# Patient Record
Sex: Male | Born: 1955 | Race: White | Hispanic: No | Marital: Married | Smoking: Former smoker
Health system: Southern US, Community
[De-identification: ages and names within clinical notes are randomized; demographics above are authoritative.]

## PROBLEM LIST (undated history)

## (undated) ENCOUNTER — Emergency Department (HOSPITAL_BASED_OUTPATIENT_CLINIC_OR_DEPARTMENT_OTHER): Admission: EM | Payer: Medicare HMO

## (undated) DIAGNOSIS — E785 Hyperlipidemia, unspecified: Secondary | ICD-10-CM

## (undated) DIAGNOSIS — I719 Aortic aneurysm of unspecified site, without rupture: Secondary | ICD-10-CM

## (undated) DIAGNOSIS — M1990 Unspecified osteoarthritis, unspecified site: Secondary | ICD-10-CM

## (undated) DIAGNOSIS — C9110 Chronic lymphocytic leukemia of B-cell type not having achieved remission: Secondary | ICD-10-CM

## (undated) DIAGNOSIS — F419 Anxiety disorder, unspecified: Secondary | ICD-10-CM

## (undated) DIAGNOSIS — I10 Essential (primary) hypertension: Secondary | ICD-10-CM

## (undated) DIAGNOSIS — F32A Depression, unspecified: Secondary | ICD-10-CM

## (undated) DIAGNOSIS — T7840XA Allergy, unspecified, initial encounter: Secondary | ICD-10-CM

## (undated) DIAGNOSIS — N400 Enlarged prostate without lower urinary tract symptoms: Secondary | ICD-10-CM

## (undated) DIAGNOSIS — G4730 Sleep apnea, unspecified: Secondary | ICD-10-CM

## (undated) DIAGNOSIS — I2510 Atherosclerotic heart disease of native coronary artery without angina pectoris: Secondary | ICD-10-CM

## (undated) DIAGNOSIS — F329 Major depressive disorder, single episode, unspecified: Secondary | ICD-10-CM

## (undated) HISTORY — DX: Benign prostatic hyperplasia without lower urinary tract symptoms: N40.0

## (undated) HISTORY — DX: Chronic lymphocytic leukemia of B-cell type not having achieved remission: C91.10

## (undated) HISTORY — DX: Atherosclerotic heart disease of native coronary artery without angina pectoris: I25.10

## (undated) HISTORY — DX: Essential (primary) hypertension: I10

## (undated) HISTORY — DX: Hyperlipidemia, unspecified: E78.5

## (undated) HISTORY — DX: Unspecified osteoarthritis, unspecified site: M19.90

## (undated) HISTORY — DX: Aortic aneurysm of unspecified site, without rupture: I71.9

## (undated) HISTORY — DX: Sleep apnea, unspecified: G47.30

## (undated) HISTORY — DX: Allergy, unspecified, initial encounter: T78.40XA

---
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
## 2008-10-07 ENCOUNTER — Emergency Department (HOSPITAL_COMMUNITY): Admission: EM | Admit: 2008-10-07 | Discharge: 2008-10-07 | Payer: Self-pay | Admitting: Family Medicine

## 2013-09-06 ENCOUNTER — Emergency Department (INDEPENDENT_AMBULATORY_CARE_PROVIDER_SITE_OTHER)
Admission: EM | Admit: 2013-09-06 | Discharge: 2013-09-06 | Disposition: A | Discharge status: Home / Self Care | Payer: BC Managed Care – PPO | Source: Home / Self Care

## 2013-09-06 ENCOUNTER — Emergency Department (INDEPENDENT_AMBULATORY_CARE_PROVIDER_SITE_OTHER): Payer: BC Managed Care – PPO

## 2013-09-06 ENCOUNTER — Emergency Department (HOSPITAL_COMMUNITY): Payer: BC Managed Care – PPO

## 2013-09-06 ENCOUNTER — Encounter (HOSPITAL_COMMUNITY): Payer: Self-pay | Admitting: Emergency Medicine

## 2013-09-06 DIAGNOSIS — S2249XA Multiple fractures of ribs, unspecified side, initial encounter for closed fracture: Secondary | ICD-10-CM

## 2013-09-06 DIAGNOSIS — S2242XA Multiple fractures of ribs, left side, initial encounter for closed fracture: Secondary | ICD-10-CM

## 2013-09-06 HISTORY — DX: Major depressive disorder, single episode, unspecified: F32.9

## 2013-09-06 HISTORY — DX: Depression, unspecified: F32.A

## 2013-09-06 HISTORY — DX: Anxiety disorder, unspecified: F41.9

## 2013-09-06 MED ORDER — KETOROLAC TROMETHAMINE 60 MG/2ML IM SOLN
INTRAMUSCULAR | Status: AC
  Filled 2013-09-06: qty 2

## 2013-09-06 MED ORDER — HYDROCODONE-ACETAMINOPHEN 5-325 MG PO TABS: 1.0000 | ORAL_TABLET | ORAL | Status: DC | PRN

## 2013-09-06 MED ORDER — OXYCODONE-ACETAMINOPHEN 5-325 MG PO TABS: 1.0000 | ORAL_TABLET | ORAL | Status: DC | PRN

## 2013-09-06 MED ORDER — KETOROLAC TROMETHAMINE 60 MG/2ML IM SOLN
60.0000 mg | Freq: Once | INTRAMUSCULAR | Status: AC
  Administered 2013-09-06: 60 mg via INTRAMUSCULAR

## 2013-09-06 NOTE — ED Notes (Signed)
Fall yesterday pm. Pt thinks it may be a combination of factors that caused him to fall and land on left posterior rib area. Ecchymosis and abrasions present . Denies other injury wife states she had to shake hip to wake him up from the ground

## 2013-09-06 NOTE — ED Provider Notes (Signed)
CSN: [id number]     Arrival date & time 09/06/13  1516 History   First MD Initiated Contact with Patient 09/06/13 1641     Chief Complaint  Patient presents with  . Fall   (Consider location/radiation/quality/duration/timing/severity/associated sxs/prior Treatment) HPI Comments: 58 year old male was on his back porch last night and stumbled over a small brick wall and fell to the left lateral chest. He is complaining of pain to the left lateral chest and the pain is exacerbated with coughing and deep breathing. He denies injury to the head, neck, back, spine, lower extremities. He is fully awake alert and oriented and denies problems with memory or orientation.   Past Medical History  Diagnosis Date  . Depressed   . Anxiety    History reviewed. No pertinent past surgical history. History reviewed. No pertinent family history. History  Substance Use Topics  . Smoking status: Never Smoker   . Smokeless tobacco: Not on file  . Alcohol Use: Yes    Review of Systems  Constitutional: Negative.   HENT: Negative.   Respiratory: Negative.  Negative for cough, chest tightness, shortness of breath, wheezing and stridor.   Cardiovascular: Negative.   Gastrointestinal: Negative.   Genitourinary: Negative.   Musculoskeletal: Negative for gait problem, myalgias, neck pain and neck stiffness.       As per HPI  Skin: Negative.   Neurological: Negative for dizziness, syncope, weakness, numbness and headaches.    Allergies  Penicillins  Home Medications   Current Outpatient Rx  Name  Route  Sig  Dispense  Refill  . LORazepam (ATIVAN) 1 MG tablet   Oral   Take 1 mg by mouth every 8 (eight) hours.         . sertraline (ZOLOFT) 50 MG tablet   Oral   Take 50 mg by mouth daily.         Marland Kitchen HYDROcodone-acetaminophen (NORCO/VICODIN) 5-325 MG per tablet   Oral   Take 1 tablet by mouth every 4 (four) hours as needed.   20 tablet   0    BP 150/78  Pulse 62  Temp(Src) 99.7 F (37.6  C) (Oral)  Resp 17  SpO2 97% Physical Exam  Nursing note and vitals reviewed. Constitutional: He is oriented to person, place, and time. He appears well-developed and well-nourished. No distress.  HENT:  Mouth/Throat: Oropharynx is clear and moist.  Eyes: Conjunctivae and EOM are normal. Pupils are equal, round, and reactive to light.  Neck: Normal range of motion. Neck supple.  Cardiovascular: Normal rate, regular rhythm and normal heart sounds.   Pulmonary/Chest: Effort normal and breath sounds normal. No respiratory distress. He has no wheezes. He has no rales. He exhibits tenderness.  Tenderness in ecchymosis to the left lateral chest wall from the midaxillary line to just across the posterior axillary line. The ecchymosis occurs in a linear fashion approximately midway between the axilla and 10th rib.  Lungs without pleural friction rub, crackles or wheezes.  Musculoskeletal: He exhibits no edema.  Neurological: He is alert and oriented to person, place, and time. He exhibits normal muscle tone.  Skin: Skin is warm and dry. No erythema.  Psychiatric: He has a normal mood and affect.    ED Course  Procedures (including critical care time) Labs Review Labs Reviewed - No data to display Imaging Review Dg Ribs Unilateral W/chest Left  09/06/2013   CLINICAL DATA:  Status post fall.  Left chest pain.  EXAM: LEFT RIBS AND CHEST - 3+ VIEW  COMPARISON:  None.  FINDINGS: Lungs are clear. No pneumothorax or pleural effusion. Heart size is normal. There are acute fractures of the left fifth, sixth and seventh ribs. No other acute abnormality is identified.  IMPRESSION: Acute left fifth through seventh rib fractures. Negative for pneumothorax.   Electronically Signed   By: Inge Rise M.D.   On: 09/06/2013 17:15     MDM   1. Multiple fractures of ribs of left side    Ibuprofen 800 q 8 h prn with food Percocet 5 q 4h prn pain #20 See your doctor nextr week Reviewed red flag sx's for  pneumo bleeding, etc. Take deep breaths often  Janne Napoleon, NP 09/06/13 1803

## 2013-09-06 NOTE — Discharge Instructions (Signed)
Rib Fracture  A rib fracture is a break or crack in one of the bones of the ribs. The ribs are a group of long, curved bones that wrap around your chest and attach to your spine. They protect your lungs and other organs in the chest cavity. A broken or cracked rib is often painful, but most do not cause other problems. Most rib fractures heal on their own over time. However, rib fractures can be more serious if multiple ribs are broken or if broken ribs move out of place and push against other structures.  CAUSES   · A direct blow to the chest. For example, this could happen during contact sports, a car accident, or a fall against a hard object.  · Repetitive movements with high force, such as pitching a baseball or having severe coughing spells.  SYMPTOMS   · Pain when you breathe in or cough.  · Pain when someone presses on the injured area.  DIAGNOSIS   Your caregiver will perform a physical exam. Various imaging tests may be ordered to confirm the diagnosis and to look for related injuries. These tests may include a chest X-ray, computed tomography (CT), magnetic resonance imaging (MRI), or a bone scan.  TREATMENT   Rib fractures usually heal on their own in 1 3 months. The longer healing period is often associated with a continued cough or other aggravating activities. During the healing period, pain control is very important. Medication is usually given to control pain. Hospitalization or surgery may be needed for more severe injuries, such as those in which multiple ribs are broken or the ribs have moved out of place.   HOME CARE INSTRUCTIONS   · Avoid strenuous activity and any activities or movements that cause pain. Be careful during activities and avoid bumping the injured rib.  · Gradually increase activity as directed by your caregiver.  · Only take over-the-counter or prescription medications as directed by your caregiver. Do not take other medications without asking your caregiver first.  · Apply ice  to the injured area for the first 1 2 days after you have been treated or as directed by your caregiver. Applying ice helps to reduce inflammation and pain.  · Put ice in a plastic bag.  · Place a towel between your skin and the bag.    · Leave the ice on for 15 20 minutes at a time, every 2 hours while you are awake.  · Perform deep breathing as directed by your caregiver. This will help prevent pneumonia, which is a common complication of a broken rib. Your caregiver may instruct you to:  · Take deep breaths several times a day.  · Try to cough several times a day, holding a pillow against the injured area.  · Use a device called an incentive spirometer to practice deep breathing several times a day.  · Drink enough fluids to keep your urine clear or pale yellow. This will help you avoid constipation.    · Do not wear a rib belt or binder. These restrict breathing, which can lead to pneumonia.    SEEK IMMEDIATE MEDICAL CARE IF:   · You have a fever.    · You have difficulty breathing or shortness of breath.    · You develop a continual cough, or you cough up thick or bloody sputum.  · You feel sick to your stomach (nausea), throw up (vomit), or have abdominal pain.    · You have worsening pain not controlled with medications.      MAKE SURE YOU:  · Understand these instructions.  · Will watch your condition.  · Will get help right away if you are not doing well or get worse.  Document Released: 06/22/2005 Document Revised: 02/22/2013 Document Reviewed: 08/24/2012  [employer]® Patient Information ©2014 [employer].

## 2013-09-07 NOTE — ED Provider Notes (Signed)
Medical screening examination/treatment/procedure(s) were performed by a resident physician or non-physician practitioner and as the supervising physician I was immediately available for consultation/collaboration.  Lynne Leader, MD    Gregor Hams, MD 09/07/13 [phone]

## 2013-09-12 ENCOUNTER — Emergency Department (HOSPITAL_BASED_OUTPATIENT_CLINIC_OR_DEPARTMENT_OTHER)
Admission: EM | Admit: 2013-09-12 | Discharge: 2013-09-12 | Disposition: A | Discharge status: Home / Self Care | Payer: BC Managed Care – PPO | Attending: Emergency Medicine | Admitting: Emergency Medicine

## 2013-09-12 ENCOUNTER — Emergency Department (HOSPITAL_BASED_OUTPATIENT_CLINIC_OR_DEPARTMENT_OTHER): Payer: BC Managed Care – PPO

## 2013-09-12 ENCOUNTER — Encounter (HOSPITAL_BASED_OUTPATIENT_CLINIC_OR_DEPARTMENT_OTHER): Payer: Self-pay | Admitting: Emergency Medicine

## 2013-09-12 DIAGNOSIS — F3289 Other specified depressive episodes: Secondary | ICD-10-CM | POA: Insufficient documentation

## 2013-09-12 DIAGNOSIS — IMO0002 Reserved for concepts with insufficient information to code with codable children: Secondary | ICD-10-CM | POA: Insufficient documentation

## 2013-09-12 DIAGNOSIS — Z79899 Other long term (current) drug therapy: Secondary | ICD-10-CM | POA: Insufficient documentation

## 2013-09-12 DIAGNOSIS — W1809XA Striking against other object with subsequent fall, initial encounter: Secondary | ICD-10-CM | POA: Insufficient documentation

## 2013-09-12 DIAGNOSIS — F329 Major depressive disorder, single episode, unspecified: Secondary | ICD-10-CM | POA: Insufficient documentation

## 2013-09-12 DIAGNOSIS — S301XXA Contusion of abdominal wall, initial encounter: Secondary | ICD-10-CM | POA: Insufficient documentation

## 2013-09-12 DIAGNOSIS — Y939 Activity, unspecified: Secondary | ICD-10-CM | POA: Insufficient documentation

## 2013-09-12 DIAGNOSIS — Y929 Unspecified place or not applicable: Secondary | ICD-10-CM | POA: Insufficient documentation

## 2013-09-12 DIAGNOSIS — R079 Chest pain, unspecified: Secondary | ICD-10-CM | POA: Insufficient documentation

## 2013-09-12 DIAGNOSIS — G8911 Acute pain due to trauma: Secondary | ICD-10-CM | POA: Insufficient documentation

## 2013-09-12 DIAGNOSIS — F411 Generalized anxiety disorder: Secondary | ICD-10-CM | POA: Insufficient documentation

## 2013-09-12 DIAGNOSIS — Z880 Allergy status to penicillin: Secondary | ICD-10-CM | POA: Insufficient documentation

## 2013-09-12 LAB — BASIC METABOLIC PANEL
BUN: 17 mg/dL (ref 6–23)
CALCIUM: 9.7 mg/dL (ref 8.4–10.5)
CO2: 26 meq/L (ref 19–32)
Chloride: 100 mEq/L (ref 96–112)
Creatinine, Ser: 1.2 mg/dL (ref 0.50–1.35)
GFR calc non Af Amer: 65 mL/min — ABNORMAL LOW (ref >90)
GFR, EST AFRICAN AMERICAN: 75 mL/min — AB (ref >90)
Glucose, Bld: 97 mg/dL (ref 70–99)
Potassium: 4.6 mEq/L (ref 3.7–5.3)
SODIUM: 139 meq/L (ref 137–147)

## 2013-09-12 MED ORDER — IOHEXOL 300 MG/ML  SOLN
100.0000 mL | Freq: Once | INTRAMUSCULAR | Status: AC | PRN
  Administered 2013-09-12: 100 mL via INTRAVENOUS

## 2013-09-12 NOTE — Discharge Instructions (Signed)

## 2013-09-12 NOTE — ED Provider Notes (Signed)
CSN: [id number]     Arrival date & time 09/12/13  1500 History   First MD Initiated Contact with Patient 09/12/13 1512     Chief Complaint  Patient presents with  . Fall     (Consider location/radiation/quality/duration/timing/severity/associated sxs/prior Treatment) HPI Comments: [place] one week ago - diagnosed with L posterior rib fractures. L flank bruising developed and worsening over past 3 days. Denies abdominal pain.   Patient is a 58 y.o. male presenting with fall. The history is provided by the patient.  Fall This is a new problem. The current episode started more than 1 week ago. Episode frequency: once. The problem has been resolved. Pertinent negatives include no chest pain, no abdominal pain and no shortness of breath. Nothing aggravates the symptoms. Nothing relieves the symptoms.    Past Medical History  Diagnosis Date  . Depressed   . Anxiety    History reviewed. No pertinent past surgical history. No family history on file. History  Substance Use Topics  . Smoking status: Never Smoker   . Smokeless tobacco: Not on file  . Alcohol Use: Yes    Review of Systems  Constitutional: Negative for fever.  Respiratory: Negative for cough and shortness of breath.   Cardiovascular: Negative for chest pain.  Gastrointestinal: Negative for nausea, vomiting, abdominal pain and diarrhea.  All other systems reviewed and are negative.      Allergies  Penicillins  Home Medications   Current Outpatient Rx  Name  Route  Sig  Dispense  Refill  . LORazepam (ATIVAN) 1 MG tablet   Oral   Take 1 mg by mouth every 8 (eight) hours.         Marland Kitchen oxyCODONE-acetaminophen (ROXICET) 5-325 MG per tablet   Oral   Take 1 tablet by mouth every 4 (four) hours as needed for severe pain.   20 tablet   0   . sertraline (ZOLOFT) 50 MG tablet   Oral   Take 50 mg by mouth daily.          BP 161/78  Pulse 61  Temp(Src) 98.1 F (36.7 C) (Oral)  Resp 18  Ht 5\' 11"  (1.803 m)  Wt  150 lb (68.04 kg)  BMI 20.93 kg/m2  SpO2 100% Physical Exam  Nursing note and vitals reviewed. Constitutional: He is oriented to person, place, and time. He appears well-developed and well-nourished. No distress.  HENT:  Head: Normocephalic and atraumatic.  Mouth/Throat: No oropharyngeal exudate.  Eyes: EOM are normal. Pupils are equal, round, and reactive to light.  Neck: Normal range of motion. Neck supple.  Cardiovascular: Normal rate and regular rhythm.  Exam reveals no friction rub.   No murmur heard. Pulmonary/Chest: Effort normal and breath sounds normal. No respiratory distress. He has no wheezes. He has no rales. He exhibits tenderness (L lateral posterior chest).  Abdominal: Soft. He exhibits no distension. There is tenderness (LUQ). There is no rebound.  L lower flank bruising  Genitourinary:     Musculoskeletal: Normal range of motion. He exhibits no edema.  Neurological: He is alert and oriented to person, place, and time. No cranial nerve deficit. He exhibits normal muscle tone.  Skin: No rash noted. He is not diaphoretic.    ED Course  Procedures (including critical care time) Labs Review Labs Reviewed  BASIC METABOLIC PANEL   Imaging Review Ct Abdomen Pelvis W Contrast  09/12/2013   CLINICAL DATA Status post fall 1 week ago with left posterior rib fracture and left flank ecchymosis.  Abdomen pain.  EXAM CT ABDOMEN AND PELVIS WITH CONTRAST  TECHNIQUE Multidetector CT imaging of the abdomen and pelvis was performed using the standard protocol following bolus administration of intravenous contrast.  CONTRAST 13mL OMNIPAQUE IOHEXOL 300 MG/ML  SOLN  COMPARISON None.  FINDINGS There is comminuted displaced fracture of the posterior left eleventh rib. There is displaced fracture of the posterior left tenth rib. There is a small left pleural effusion with atelectasis of the posterior left lung base.  The liver, spleen, pancreas, gallbladder, adrenal glands are normal. There  are sub centimeter simple cysts within both kidneys. There is no hydronephrosis bilaterally. There is atherosclerosis of the abdominal aorta without aneurysmal dilatation. There is no abdominal lymphadenopathy. There is a minimal hiatal hernia. There is no small bowel obstruction or diverticulitis. The appendix is normal. There is no free air or free fluid.  Fluid-filled bladder is normal. Degenerative joint changes of the spine are noted.  IMPRESSION No evidence of acute posttraumatic injury in the abdomen and pelvis. Fractures of the left tenth and eleventh ribs with adjacent small left pleural effusion and atelectasis of posterior left lung base.  SIGNATURE  Electronically Signed   By: Abelardo Diesel M.D.   On: 09/12/2013 16:39     EKG Interpretation None      MDM   Final diagnoses:  Superficial bruising of abdominal wall    21M presents with abdominal bruising. Diagnosed with rib fractures 1 week ago after falling on his L side and hitting some brick beneath a fence in his L posterior lower ribcage. He was diagnosed with 3 rib fractures and was sent home with percocet. He states minimal improvement, however he noticed a large L flank ecchymosis developing 3 days ago and it has worsened. He denies any abdominal pain, fever, vomiting, anticoagulant use.  On exam, large left lower flank ecchymosis with mild scrotal ecchymosis. L posterior lateral chest wall tenderness without paradoxical motion or crepitus. On abdominal exam, LUQ palpation elicits L chest tenderness, but no other abdominal tenderness. Lungs clear. Will CT his abdomen to look for possible splenic, renal, or other internal trauma as cause of his bruising.  CT without intra-abdominal injury. Has pain meds for rib fracture, which is comminuted with displacement and small pleural effusion. He is feeling better, instructed to continue with deep breathing and pain meds PRN.  I have reviewed all labs and imaging and considered them in my  medical decision making.   Osvaldo Shipper, MD 09/12/13 [phone]

## 2013-09-12 NOTE — ED Notes (Signed)
Fell 1 week ago-was seen at [facility]-dx with rb rfxs-concerned with extensive bruising to left abd and groin that was noticed over the weekend

## 2014-12-09 IMAGING — CT CT ABD-PELV W/ CM
2 of 5 series · 16 of 46 positions shown, 18 images · IV contrast (APPLIED)
Comparison: none

[Series 2: abd/pelvis 5.0 b31f · axial · 0.68mm/px · z∈[-480,-85]mm · 13 of 89 slices shown, 15 images]
[im 5/89  soft-tissue]
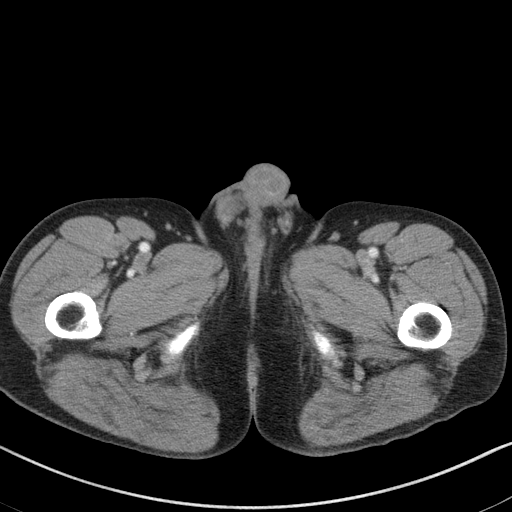
[im 5/89  bone]
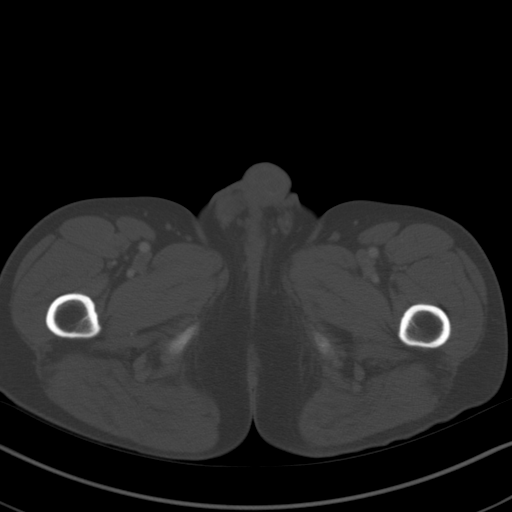
[im 14/89  soft-tissue]
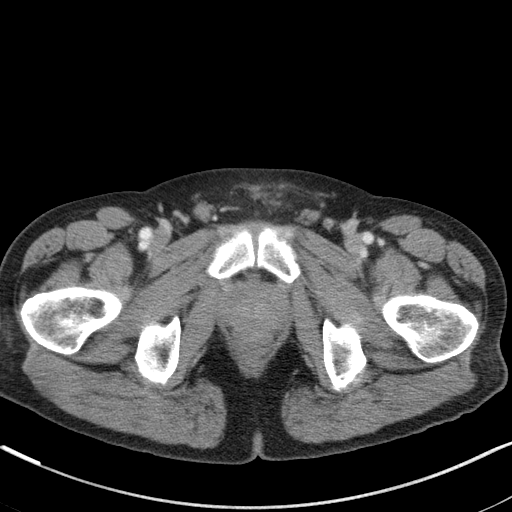
[im 19/89  soft-tissue]
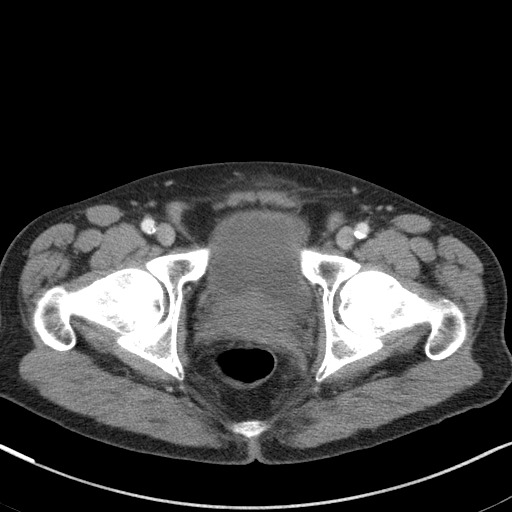
[im 24/89  soft-tissue]
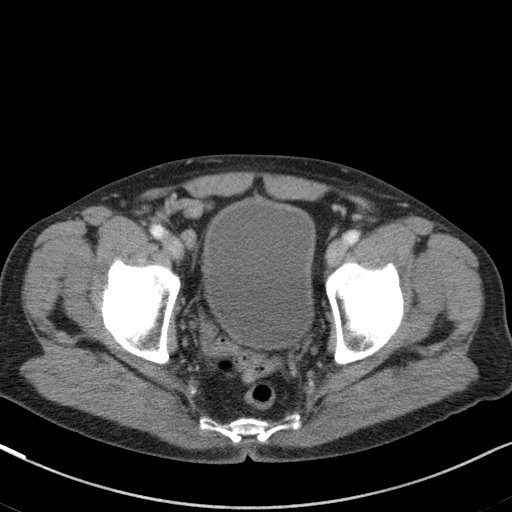
[im 33/89  soft-tissue]
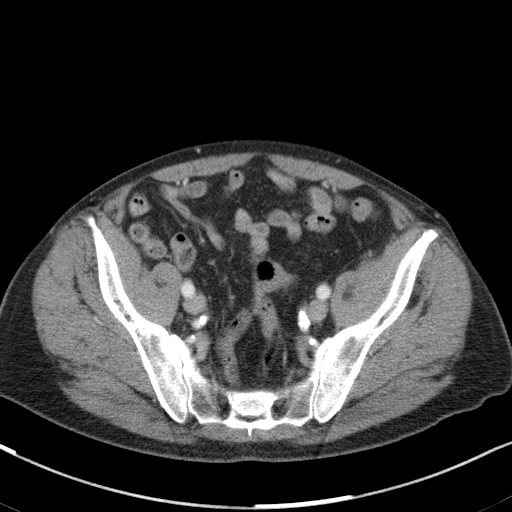
[im 38/89  soft-tissue]
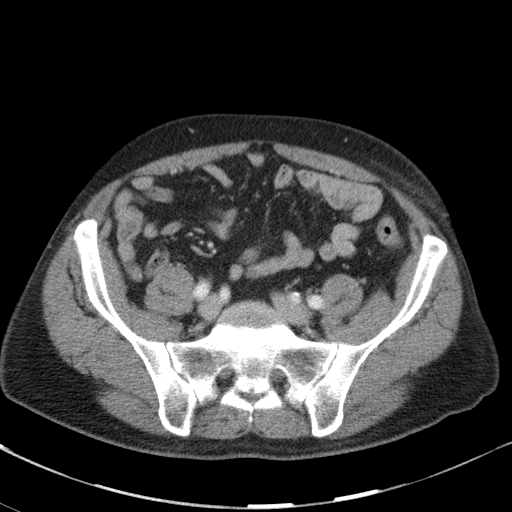
[im 47/89  soft-tissue]
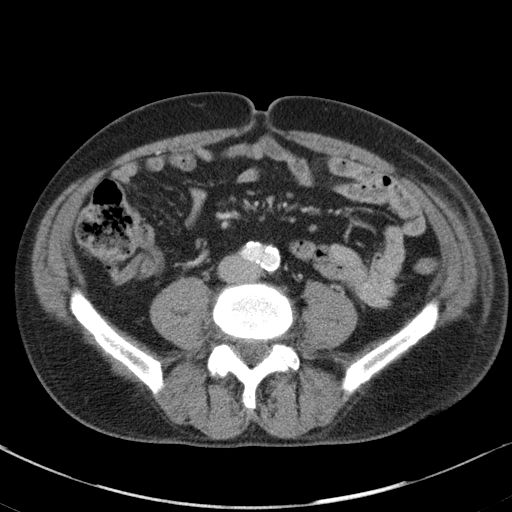
[im 51/89  soft-tissue]
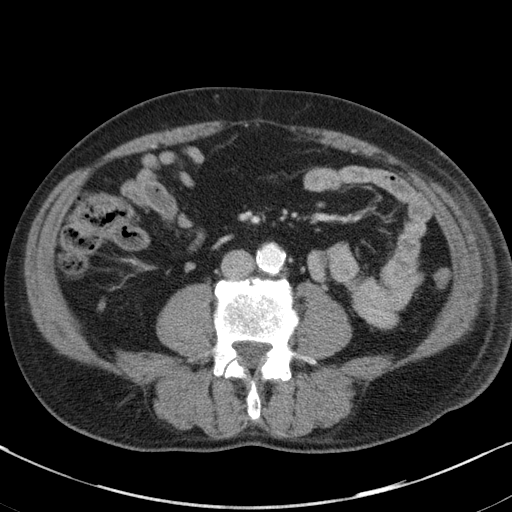
[im 56/89  soft-tissue]
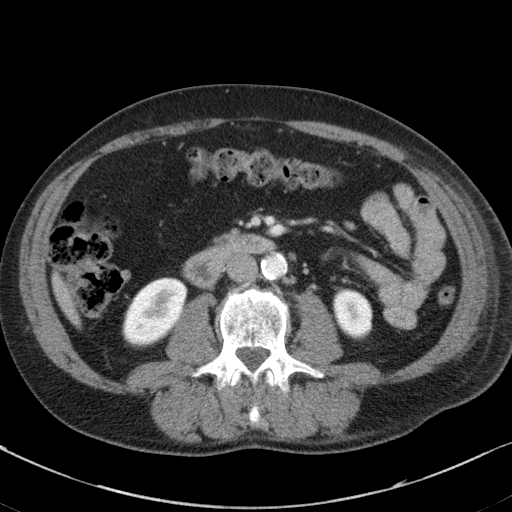
[im 56/89  bone]
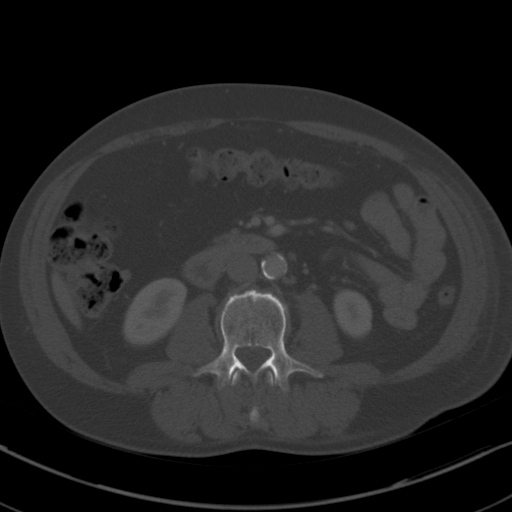
[im 65/89  soft-tissue]
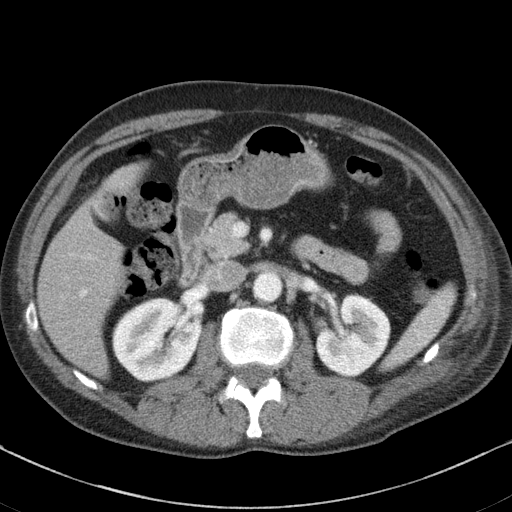
[im 70/89  soft-tissue]
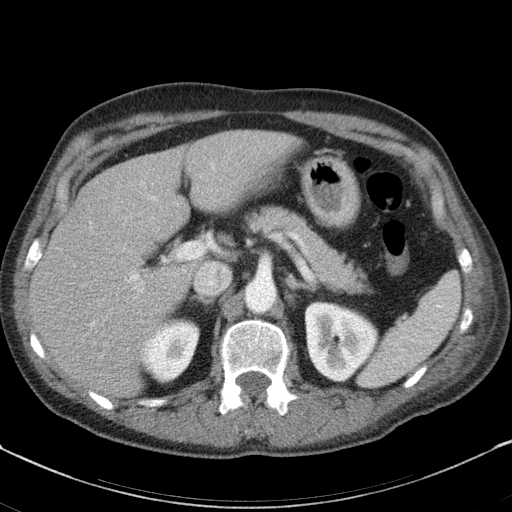
[im 75/89  soft-tissue]
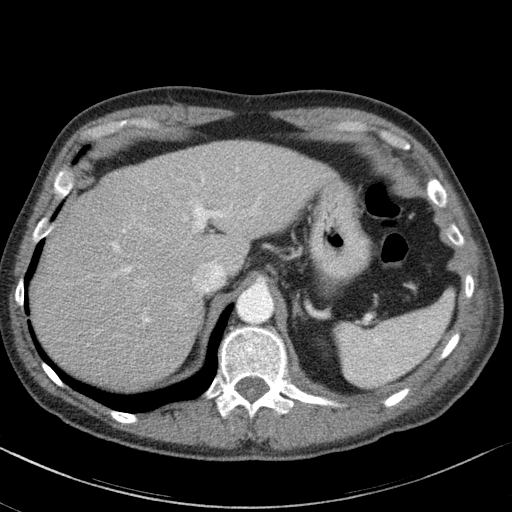
[im 84/89  soft-tissue]
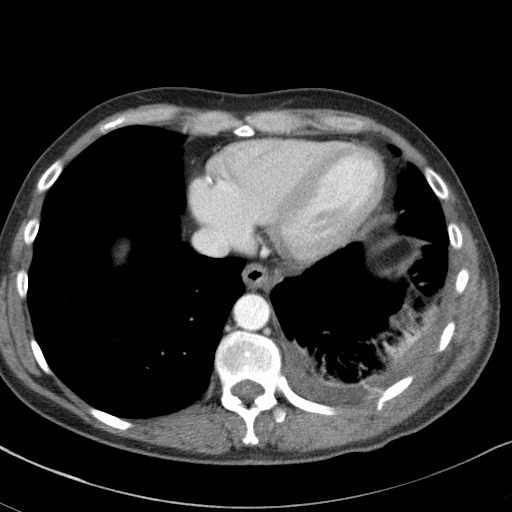

[Series 5: abd/pelvis 3.0 coronal · coronal · 0.73mm/px · 3 of 84 slices shown]
[im 28/84  soft-tissue]
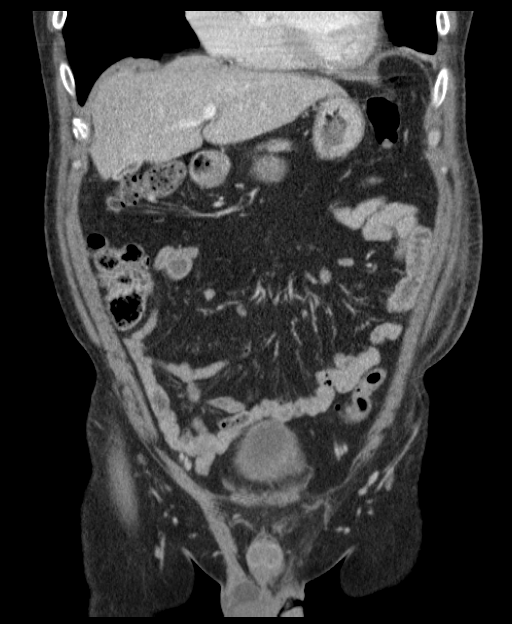
[im 37/84  soft-tissue]
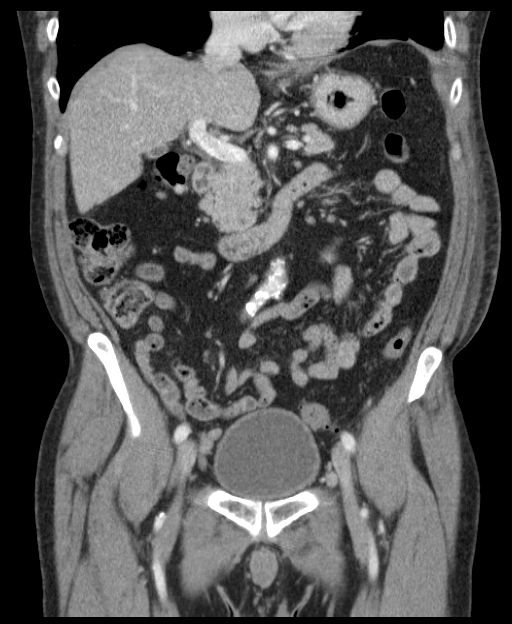
[im 47/84  soft-tissue]
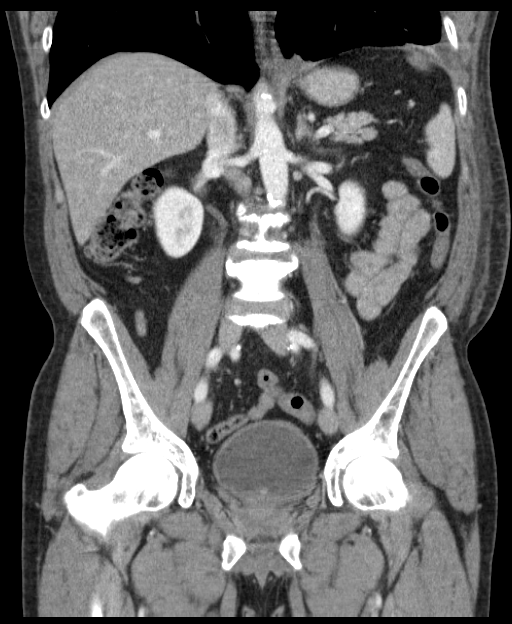

[16 of 46 positions shown; findings below may reference images not displayed]

CLINICAL DATA
Status post fall 1 week ago with left posterior rib fracture and
left flank ecchymosis. Abdomen pain.

EXAM
CT ABDOMEN AND PELVIS WITH CONTRAST

TECHNIQUE
Multidetector CT imaging of the abdomen and pelvis was performed
using the standard protocol following bolus administration of
intravenous contrast.

CONTRAST
100mL OMNIPAQUE IOHEXOL 300 MG/ML  SOLN

COMPARISON
None.

FINDINGS
There is comminuted displaced fracture of the posterior left
eleventh rib. There is displaced fracture of the posterior left
tenth rib. There is a small left pleural effusion with atelectasis
of the posterior left lung base.

The liver, spleen, pancreas, gallbladder, adrenal glands are normal.
There are sub centimeter simple cysts within both kidneys. There is
no hydronephrosis bilaterally. There is atherosclerosis of the
abdominal aorta without aneurysmal dilatation. There is no abdominal
lymphadenopathy. There is a minimal hiatal hernia. There is no small
bowel obstruction or diverticulitis. The appendix is normal. There
is no free air or free fluid.

Fluid-filled bladder is normal. Degenerative joint changes of the
spine are noted.

IMPRESSION
No evidence of acute posttraumatic injury in the abdomen and pelvis.
Fractures of the left tenth and eleventh ribs with adjacent small
left pleural effusion and atelectasis of posterior left lung base.

SIGNATURE

## 2015-03-04 ENCOUNTER — Ambulatory Visit: Payer: BLUE CROSS/BLUE SHIELD

## 2015-03-04 ENCOUNTER — Ambulatory Visit
Admission: EM | Admit: 2015-03-04 | Discharge: 2015-03-04 | Disposition: A | Discharge status: Home / Self Care | Payer: BLUE CROSS/BLUE SHIELD | Attending: Family Medicine | Admitting: Family Medicine

## 2015-03-04 DIAGNOSIS — J069 Acute upper respiratory infection, unspecified: Secondary | ICD-10-CM | POA: Diagnosis not present

## 2015-03-04 DIAGNOSIS — R05 Cough: Secondary | ICD-10-CM | POA: Diagnosis not present

## 2015-03-04 DIAGNOSIS — R059 Cough, unspecified: Secondary | ICD-10-CM

## 2015-03-04 DIAGNOSIS — J4541 Moderate persistent asthma with (acute) exacerbation: Secondary | ICD-10-CM

## 2015-03-04 LAB — CBC WITH DIFFERENTIAL/PLATELET
BASOS ABS: 0.1 10*3/uL (ref 0–0.1)
BASOS PCT: 1 %
Eosinophils Absolute: 0.1 10*3/uL (ref 0–0.7)
Eosinophils Relative: 2 %
HCT: 42.9 % (ref 40.0–52.0)
Hemoglobin: 14.6 g/dL (ref 13.0–18.0)
LYMPHS ABS: 1.6 10*3/uL (ref 1.0–3.6)
Lymphocytes Relative: 28 %
MCH: 32.5 pg (ref 26.0–34.0)
MCHC: 34 g/dL (ref 32.0–36.0)
MCV: 95.6 fL (ref 80.0–100.0)
MONO ABS: 0.7 10*3/uL (ref 0.2–1.0)
MONOS PCT: 12 %
NEUTROS PCT: 57 %
Neutro Abs: 3.2 10*3/uL (ref 1.4–6.5)
PLATELETS: 218 10*3/uL (ref 150–440)
RBC: 4.49 MIL/uL (ref 4.40–5.90)
RDW: 13.2 % (ref 11.5–14.5)
WBC: 5.7 10*3/uL (ref 3.8–10.6)

## 2015-03-04 LAB — COMPREHENSIVE METABOLIC PANEL
ALT: 49 U/L (ref 17–63)
AST: 37 U/L (ref 15–41)
Albumin: 4.5 g/dL (ref 3.5–5.0)
Alkaline Phosphatase: 76 U/L (ref 38–126)
Anion gap: 9 (ref 5–15)
BUN: 13 mg/dL (ref 6–20)
CHLORIDE: 100 mmol/L — AB (ref 101–111)
CO2: 29 mmol/L (ref 22–32)
Calcium: 9.6 mg/dL (ref 8.9–10.3)
Creatinine, Ser: 0.94 mg/dL (ref 0.61–1.24)
GFR calc Af Amer: >60 mL/min (ref >60)
GLUCOSE: 97 mg/dL (ref 65–99)
Potassium: 4.2 mmol/L (ref 3.5–5.1)
Sodium: 138 mmol/L (ref 135–145)
Total Bilirubin: 0.5 mg/dL (ref 0.3–1.2)
Total Protein: 7.3 g/dL (ref 6.5–8.1)

## 2015-03-04 MED ORDER — HYDROCOD POLST-CPM POLST ER 10-8 MG/5ML PO SUER: 5.0000 mL | Freq: Two times a day (BID) | ORAL | Status: DC | PRN

## 2015-03-04 MED ORDER — FEXOFENADINE-PSEUDOEPHED ER 180-240 MG PO TB24: 1.0000 | ORAL_TABLET | Freq: Every day | ORAL | Status: DC

## 2015-03-04 MED ORDER — PREDNISONE 10 MG (21) PO TBPK: ORAL_TABLET | ORAL | Status: DC

## 2015-03-04 MED ORDER — SUPRAX 200 MG PO CHEW: 400.0000 mg | CHEWABLE_TABLET | ORAL | Status: DC

## 2015-03-04 NOTE — ED Provider Notes (Signed)
CSN: [id number]     Arrival date & time 03/04/15  1724 History   First MD Initiated Contact with Patient 03/04/15 1854     Chief Complaint  Patient presents with  . Cough   states that he's had this cough now for about 5 weeks. He was exposed some fumes while [place] on the beach. At about for 5 days coughing he went to the urgent care in [place] and was seen and evaluated. He was placed on 12 days prednisone dose taper pack she was unable to take due to the pills be taken 6 times a day than 5 times a day to 4 times a day, gave up trying take it. His place on Z-Pak and albuterol inhaler. Cough and continued he saw Dr. Hall Busing about 12 weeks after being seen in [place] at that time he was placed on a long-acting COPD inhaler. That did not help much. He's been taking some Benadryl off and on to [place] Benadryl today but after the coughing is so bad he drove Dr. takes office because of his general malaise and not feeling well which is states he thinks is due to the Benadryl he was sent here.  She also be noted that when he was seen at the [facility] urgent care EKG was obtained as well that he states was normal and chest x-ray that was obtained and was also told that was normal that time as well.  (Consider location/radiation/quality/duration/timing/severity/associated sxs/prior Treatment) Patient is a 59 y.o. male presenting with cough. The history is provided by the patient. No language interpreter was used.  Cough Cough characteristics:  Non-productive Severity:  Severe Onset quality:  Sudden Duration:  5 weeks Timing:  Sporadic Progression:  Waxing and waning Chronicity:  New Smoker: Former smoker.   Context: fumes   Relieved by:  Nothing Ineffective treatments:  Beta-agonist inhaler and ipratropium inhaler Associated symptoms: rhinorrhea and sinus congestion   Associated symptoms: no chest pain, no diaphoresis, no ear pain, no shortness of breath, no sore throat and no wheezing    Associated symptoms comment:  Today he had generalized malaise and fatigue. He thinks he may benefit from Benadryl he took this afternoon but he Takes office and felt so bad apparently they did not want to see him and sent him here instead. Rhinorrhea:    Quality:  Clear   Severity:  Moderate   Progression:  Unchanged Risk factors: chemical exposure and recent infection   Risk factors: no recent travel     Past Medical History  Diagnosis Date  . Depressed   . Anxiety    Past Surgical History  Procedure Laterality Date  . Tonsillectomy  1961   Family History  Problem Relation Age of Onset  . Heart attack Father   . Hypertension Mother    Social History  Substance Use Topics  . Smoking status: Never Smoker   . Smokeless tobacco: None  . Alcohol Use: 0.0 oz/week    0 Standard drinks or equivalent per week     Comment: beer occasionally    Review of Systems  Constitutional: Negative for diaphoresis.  HENT: Positive for rhinorrhea. Negative for ear pain and sore throat.   Respiratory: Positive for cough. Negative for shortness of breath and wheezing.   Cardiovascular: Negative for chest pain.  All other systems reviewed and are negative.    Patient is a former smoker. He is also taking Zoloft for the Prevacid had problems. Allergies  Penicillins  Home Medications  Prior to Admission medications   Medication Sig Start Date End Date Taking? Authorizing Provider  sertraline (ZOLOFT) 50 MG tablet Take 50 mg by mouth daily.   Yes Historical Provider, MD  chlorpheniramine-HYDROcodone (TUSSIONEX PENNKINETIC ER) 10-8 MG/5ML SUER Take 5 mLs by mouth every 12 (twelve) hours as needed for cough. 03/04/15   Frederich Cha, MD  fexofenadine-pseudoephedrine (ALLEGRA-D ALLERGY & CONGESTION) 180-240 MG per 24 hr tablet Take 1 tablet by mouth daily. 03/04/15   Frederich Cha, MD  LORazepam (ATIVAN) 1 MG tablet Take 1 mg by mouth every 8 (eight) hours.    Historical Provider, MD   oxyCODONE-acetaminophen (ROXICET) 5-325 MG per tablet Take 1 tablet by mouth every 4 (four) hours as needed for severe pain. 09/06/13   Janne Napoleon, NP  predniSONE (STERAPRED UNI-PAK 21 TAB) 10 MG (21) TBPK tablet Sig 6 tablet day 1, 5 tablets day 2, 4 tablets day 3,,3tablets day 4, 2 tablets day 5, 1 tablet day 6 take all tablets orally 03/04/15   Frederich Cha, MD  SUPRAX 200 MG CHEW Chew 400 mg by mouth 1 day or 1 dose. 03/04/15   Frederich Cha, MD   Meds Ordered and Administered this Visit  Medications - No data to display  BP 167/104 mmHg  Pulse 73  Temp(Src) 98.5 F (36.9 C) (Oral)  Resp 16  Ht 5\' 11"  (1.803 m)  Wt 160 lb (72.576 kg)  BMI 22.33 kg/m2  SpO2 100% No data found.   Physical Exam  Constitutional: He is oriented to person, place, and time. He appears well-developed and well-nourished. No distress.  HENT:  Head: Normocephalic.  Eyes: Pupils are equal, round, and reactive to light.  Neck: Neck supple.  Cardiovascular: Normal rate and regular rhythm.   Pulmonary/Chest: Effort normal and breath sounds normal. He has no decreased breath sounds. He has no wheezes. He has no rhonchi.  Patient actively coughing  Musculoskeletal: Normal range of motion. He exhibits no edema.  Neurological: He is alert and oriented to person, place, and time. No cranial nerve deficit.  Skin: Skin is warm and dry. No erythema.  Psychiatric: He has a normal mood and affect.  Vitals reviewed.   ED Course  Procedures (including critical care time)  Labs Review Labs Reviewed  COMPREHENSIVE METABOLIC PANEL - Abnormal; Notable for the following:    Chloride 100 (*)    All other components within normal limits  CBC WITH DIFFERENTIAL/PLATELET    Imaging Review Dg Chest 2 View  03/04/2015   CLINICAL DATA:  Persistent cough for 5 weeks.  EXAM: CHEST  2 VIEW  COMPARISON:  09/06/2013  FINDINGS: The cardiac silhouette, mediastinal and hilar contours are within normal limits and stable. The lungs are  clear. No pleural effusion. The bony thorax is intact. Remote healed rib fractures are noted bilaterally. Bilateral cervical ribs are noted.  IMPRESSION: No acute cardiopulmonary findings.   Electronically Signed   By: Marijo Sanes M.D.   On: 03/04/2015 19:40     Visual Acuity Review  Right Eye Distance:   Left Eye Distance:   Bilateral Distance:    Right Eye Near:   Left Eye Near:    Bilateral Near:         MDM   1. Cough   2. URI, acute   3. Reactive airway disease, moderate persistent, with acute exacerbation     Discussed Mr. Kleen in length this chest x-ray was negative. I think we are dealing with an reactive airway disease. Will  treat him with another anabiotic. His pencillin allergy something he states he's had as a child and he always told his doctors but is not sure that is real. Placement Suprax 400 mg two  200 mg capsules or tablets daily. Another course of prednisone but this time 6 days. Tussionex 1 teaspoon twice a day. Continue albuterol inhaler and because of the rhinorrhea this occurring which may be part of problem placement Allegra-D 1 tablet a day. We think the sedation that he had earlier today may have been from the Benadryl so he should avoid the Benadryl. I suggested that he not better in 2 weeks to see Dr. Hall Busing and may need to see ENT for indirect laryngoscopy to make sure there is nothing irritating his vocal cord causing his chronic cough. He declines work note at this time.   Frederich Cha, MD 03/04/15 2102

## 2015-03-04 NOTE — ED Notes (Signed)
Patient complains of cough that started 5 weeks ago after breathing in sealer from his deck. He saw an Urgent Care and they started him on Zpack, Prednisone and Albuterol inhaler. He states that cough has still been lingering. He states that he saw Dr. Hall Busing on 02/15/2015 and he started him on Anoro Ellipta and Mucinex and nasal saline. He states that he has still not improved and describes the cough as a burning sensation when he breathes. He states that today he was very weak and had his vitals check and they were normal. He states that cough is constant and some nasal congestion. He states that cough is dry but nose is runny.

## 2015-03-04 NOTE — Discharge Instructions (Signed)
Bronchospasm A bronchospasm is when the tubes that carry air in and out of your lungs (airways) spasm or tighten. During a bronchospasm it is hard to breathe. This is because the airways get smaller. A bronchospasm can be triggered by:  Allergies. These may be to animals, pollen, food, or mold.  Infection. This is a common cause of bronchospasm.  Exercise.  Irritants. These include pollution, cigarette smoke, strong odors, aerosol sprays, and paint fumes.  Weather changes.  Stress.  Being emotional. HOME CARE   Always have a plan for getting help. Know when to call your doctor and local emergency services (911 in the [place] Know where you can get emergency care.  Only take medicines as told by your doctor.  If you were prescribed an inhaler or nebulizer machine, ask your doctor how to use it correctly. Always use a spacer with your inhaler if you were given one.  Stay calm during an attack. Try to relax and breathe more slowly.  Control your home environment:  Change your heating and air conditioning filter at least once a month.  Limit your use of fireplaces and wood stoves.  Do not  smoke. Do not  allow smoking in your home.  Avoid perfumes and fragrances.  Get rid of pests (such as roaches and mice) and their droppings.  Throw away plants if you see mold on them.  Keep your house clean and dust free.  Replace carpet with wood, tile, or vinyl flooring. Carpet can trap dander and dust.  Use allergy-proof pillows, mattress covers, and box spring covers.  Wash bed sheets and blankets every week in hot water. Dry them in a dryer.  Use blankets that are made of polyester or cotton.  Wash hands frequently. GET HELP IF:  You have muscle aches.  You have chest pain.  The thick spit you spit or cough up (sputum) changes from clear or white to yellow, green, gray, or bloody.  The thick spit you spit or cough up gets thicker.  There are problems that may be related  to the medicine you are given such as:  A rash.  Itching.  Swelling.  Trouble breathing. GET HELP RIGHT AWAY IF:  You feel you cannot breathe or catch your breath.  You cannot stop coughing.  Your treatment is not helping you breathe better.  You have very bad chest pain. MAKE SURE YOU:   Understand these instructions.  Will watch your condition.  Will get help right away if you are not doing well or get worse. Document Released: 04/19/2009 Document Revised: 06/27/2013 Document Reviewed: 12/13/2012 [facility] Patient Information 2015 [place], [place]. This information is not intended to replace advice given to you by your health care provider. Make sure you discuss any questions you have with your health care provider.  Cough, Adult  A cough is a reflex. It helps you clear your throat and airways. A cough can help heal your body. A cough can last 2 or 3 weeks (acute) or may last more than 8 weeks (chronic). Some common causes of a cough can include an infection, allergy, or a cold. HOME CARE  Only take medicine as told by your doctor.  If given, take your medicines (antibiotics) as told. Finish them even if you start to feel better.  Use a cold steam vaporizer or humidifier in your home. This can help loosen thick spit (secretions).  Sleep so you are almost sitting up (semi-upright). Use pillows to do this. This helps reduce coughing.  Rest as needed.  Stop smoking if you smoke. GET HELP RIGHT AWAY IF:  You have yellowish-white fluid (pus) in your thick spit.  Your cough gets worse.  Your medicine does not reduce coughing, and you are losing sleep.  You cough up blood.  You have trouble breathing.  Your pain gets worse and medicine does not help.  You have a fever. MAKE SURE YOU:   Understand these instructions.  Will watch your condition.  Will get help right away if you are not doing well or get worse. Document Released: 03/05/2011 Document Revised:  11/06/2013 Document Reviewed: 03/05/2011 [facility] Patient Information 2015 [place], [place]. This information is not intended to replace advice given to you by your health care provider. Make sure you discuss any questions you have with your health care provider.  Upper Respiratory Infection, Adult An upper respiratory infection (URI) is also known as the common cold. It is often caused by a type of germ (virus). Colds are easily spread (contagious). You can pass it to others by kissing, coughing, sneezing, or drinking out of the same glass. Usually, you get better in 1 or 2 weeks.  HOME CARE   Only take medicine as told by your doctor.  Use a warm mist humidifier or breathe in steam from a hot shower.  Drink enough water and fluids to keep your pee (urine) clear or pale yellow.  Get plenty of rest.  Return to work when your temperature is back to normal or as told by your doctor. You may use a face mask and wash your hands to stop your cold from spreading. GET HELP RIGHT AWAY IF:   After the first few days, you feel you are getting worse.  You have questions about your medicine.  You have chills, shortness of breath, or brown or red spit (mucus).  You have yellow or brown snot (nasal discharge) or pain in the face, especially when you bend forward.  You have a fever, puffy (swollen) neck, pain when you swallow, or white spots in the back of your throat.  You have a bad headache, ear pain, sinus pain, or chest pain.  You have a high-pitched whistling sound when you breathe in and out (wheezing).  You have a lasting cough or cough up blood.  You have sore muscles or a stiff neck. MAKE SURE YOU:   Understand these instructions.  Will watch your condition.  Will get help right away if you are not doing well or get worse. Document Released: 12/09/2007 Document Revised: 09/14/2011 Document Reviewed: 09/27/2013 [facility] Patient Information 2015 [place], [place]. This information is  not intended to replace advice given to you by your health care provider. Make sure you discuss any questions you have with your health care provider.

## 2015-07-19 ENCOUNTER — Ambulatory Visit (INDEPENDENT_AMBULATORY_CARE_PROVIDER_SITE_OTHER): Payer: BLUE CROSS/BLUE SHIELD | Admitting: Internal Medicine

## 2015-07-19 ENCOUNTER — Encounter: Payer: Self-pay | Admitting: Internal Medicine

## 2015-07-19 VITALS — BP 136/86 | HR 62 | Ht 71.0 in | Wt 167.4 lb

## 2015-07-19 DIAGNOSIS — R05 Cough: Secondary | ICD-10-CM

## 2015-07-19 DIAGNOSIS — R058 Other specified cough: Secondary | ICD-10-CM | POA: Insufficient documentation

## 2015-07-19 MED ORDER — OMEPRAZOLE 40 MG PO CPDR: 40.0000 mg | DELAYED_RELEASE_CAPSULE | Freq: Every day | ORAL | Status: DC

## 2015-07-19 MED ORDER — FAMOTIDINE 20 MG PO TABS: ORAL_TABLET | ORAL | Status: DC

## 2015-07-19 MED ORDER — PREDNISONE 10 MG PO TABS: ORAL_TABLET | ORAL | Status: DC

## 2015-07-19 NOTE — Assessment & Plan Note (Addendum)
The most common causes of chronic cough in immunocompetent adults include the following: upper airway cough syndrome (UACS), previously referred to as postnasal drip syndrome (PNDS), which is caused by variety of rhinosinus conditions; (2) asthma; (3) GERD; (4) chronic bronchitis from cigarette smoking or other inhaled environmental irritants; (5) nonasthmatic eosinophilic bronchitis; and (6) bronchiectasis.   These conditions, singly or in combination, have accounted for up to 94% of the causes of chronic cough in prospective studies.   Other conditions have constituted no >6% of the causes in prospective studies These have included bronchogenic carcinoma, chronic interstitial pneumonia, sarcoidosis, left ventricular failure, ACEI-induced cough, and aspiration from a condition associated with pharyngeal dysfunction.    Chronic cough is often simultaneously caused by more than one condition. A single cause has been found from 38 to 82% of the time, multiple causes from 18 to 62%. Multiply caused cough has been the result of three diseases up to 42% of the time.       Based on hx and exam, this is most likely:  Classic Upper airway cough syndrome, so named because it's frequently impossible to sort out how much is  CR/sinusitis with freq throat clearing (which can be related to primary GERD)   vs  causing  secondary (" extra esophageal")  GERD from wide swings in gastric pressure that occur with throat clearing, often  promoting self use of mint and menthol lozenges that reduce the lower esophageal sphincter tone and exacerbate the problem further in a cyclical fashion.   These are the same pts (now being labeled as having "irritable larynx syndrome" by some cough centers) who not infrequently have a history of having failed to tolerate ace inhibitors,  dry powder inhalers or biphosphonates or report having atypical reflux symptoms that don't respond to standard doses of PPI , and are easily confused as  having aecopd or asthma flares by even experienced allergists/ pulmonologists.   The first step is to maximize acid suppression and eliminate pnds with 1st gen H1 and cyclical coughing then regroup if the cough persists.  I had an extended discussion with the patient reviewing all relevant studies completed to date and  lasting 35/60 min initial office visit.   1) Explained: The standardized cough guidelines published in Chest by Lissa Morales in 2006 are still the best available and consist of a multiple step process (up to 12!) , not a single office visit,  and are intended  to address this problem logically,  with an alogrithm dependent on response to empiric treatment at  each progressive step  to determine a specific diagnosis with  minimal addtional testing needed. Therefore if adherence is an issue or can't be accurately verified,  it's very unlikely the standard evaluation and treatment will be successful here.    Furthermore, response to therapy (other than acute cough suppression, which should only be used short term with avoidance of narcotic containing cough syrups if possible), can be a gradual process for which the patient may perceive immediate benefit.  Unlike going to an eye doctor where the best perscription is almost always the first one and is immediately effective, this is almost never the case in the management of chronic cough syndromes. Therefore the patient needs to commit up front to consistently adhere to recommendations  for up to 6 weeks of therapy directed at the likely underlying problem(s) before the response can be reasonably evaluated.     2) Each maintenance medication was reviewed in detail including most  importantly the difference between maintenance and prns and under what circumstances the prns are to be triggered using an action plan format that is not reflected in the computer generated alphabetically organized AVS.    Please see instructions for details which  were reviewed in writing and the patient given a copy highlighting the part that I personally wrote and discussed at today's ov.   See instructions for specific recommendations which were reviewed directly with the patient who was given a copy with highlighter outlining the key components.

## 2015-07-19 NOTE — Progress Notes (Signed)
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    07/19/2015 1st [facility] office visit/ [facility]   Chief Complaint  Patient presents with  . Pulmonary Consult    Self referral. Pt c/o cough and SOB off and on since July 2016- began after he inhaled fumes from paint and was dxed with severe bronchitis. He states that he has clear nasal drainage and non prod cough every am.   cough p stirs in am does not wake him up assoc with production copious nasal drainage > clear mucus x sev tbsp   No obvious day to day or daytime variability or assoc sob or cp or chest tightness, subjective wheeze or overt  hb symptoms. No unusual exp hx or h/o childhood pna/ asthma or knowledge of premature birth.  Sleeping ok without nocturnal  or early am exacerbation  of respiratory  c/o's or need for noct saba. Also denies any obvious fluctuation of symptoms with weather or environmental changes or other aggravating or alleviating factors except as outlined above   Current Medications, Allergies, Complete Past Medical History, Past Surgical History, Family History, and Social History were reviewed in [employer] record.  ROS  The following are not active complaints unless bolded sore throat, dysphagia, dental problems, itching, sneezing,  nasal congestion or excess  secretions, ear ache,   fever, chills, sweats, unintended wt loss,  classically pleuritic or exertional cp, hemoptysis,  orthopnea pnd or leg swelling, presyncope, palpitations, abdominal pain, anorexia, nausea, vomiting, diarrhea  or change in bowel or bladder habits, change in stools or urine, dysuria,hematuria,  rash, arthralgias, visual complaints, headache, numbness, weakness or ataxia or problems with walking or coordination,  change in mood/affect or memory.           Review of Systems     Objective:   Physical Exam amb wm nad   Wt Readings from Last 3 Encounters:  07/19/15 167 lb 6.4 oz (75.932 kg)  03/04/15 160 lb (72.576 kg)  09/12/13 150 lb (68.04 kg)    Vital signs reviewed   HEENT: nl dentition, turbinates, and oropharynx. Nl external ear canals without cough reflex   NECK :  without JVD/Nodes/TM/ nl carotid upstrokes bilaterally   LUNGS: no acc muscle use,  Nl contour chest which is clear to A and P bilaterally without cough on insp or exp maneuvers   CV:  RRR  no s3 or murmur or increase in P2, no edema   ABD:  soft and nontender with nl inspiratory excursion in the supine position. No bruits or organomegaly, bowel sounds nl  MS:  Nl gait/ ext warm without deformities, calf tenderness, cyanosis or clubbing No obvious joint restrictions   SKIN: warm and dry without lesions    NEURO:  alert, approp, nl sensorium with  no  motor deficits     I personally reviewed images and agree with radiology impression as follows:  CXR: 03/04/15 No acute cardiopulmonary findings.     Assessment:

## 2015-07-19 NOTE — Patient Instructions (Addendum)
The key to effective treatment for your cough is eliminating the non-stop cycle of cough you're stuck in long enough to let your airway heal completely and then see if there is anything still making you cough once you stop the cough suppression, but this should take no more than 5 days to figure out   Go ahead and use your cough medication every 4 hours to suppress even the urge to cough and then gradually wean off after 3 days   Prednisone 10 mg take  4 each am x 2 days,   2 each am x 2 days,  1 each am x 2 days and stop (this is to eliminate allergies and inflammation from coughing)  Omeprazole  40 mg Take 30-60 min before first meal of the day and Pepcid 20 mg one bedtime plus chlorpheniramine 4 mg x 2 at bedtime (both available over the counter)  until cough is completely gone for at least a week without the need for cough suppression  For drainage / throat tickle try take CHLORPHENIRAMINE  4 mg - take one every 4 hours as needed - available over the counter- may cause drowsiness so start with just a bedtime dose or two and see how you tolerate it before trying in daytime    GERD (REFLUX)  is an extremely common cause of respiratory symptoms, many times with no significant heartburn at all.    It can be treated with medication, but also with lifestyle changes including avoidance of late meals, excessive alcohol, smoking cessation, and avoid fatty foods, chocolate, peppermint, colas, red wine, and acidic juices such as orange juice.  NO MINT OR MENTHOL PRODUCTS SO NO COUGH DROPS  USE HARD CANDY INSTEAD (jolley ranchers or Stover's or Lifesavers (all available in sugarless versions) NO OIL BASED VITAMINS - use powdered substitutes.  If not 100% better return in 2 weeks to regroup

## 2017-01-24 ENCOUNTER — Encounter (HOSPITAL_COMMUNITY): Payer: Self-pay

## 2017-01-24 ENCOUNTER — Ambulatory Visit (HOSPITAL_COMMUNITY)
Admission: EM | Admit: 2017-01-24 | Discharge: 2017-01-24 | Disposition: A | Discharge status: Home / Self Care | Payer: BLUE CROSS/BLUE SHIELD | Attending: Internal Medicine | Admitting: Internal Medicine

## 2017-01-24 DIAGNOSIS — J302 Other seasonal allergic rhinitis: Secondary | ICD-10-CM | POA: Diagnosis not present

## 2017-01-24 DIAGNOSIS — R0982 Postnasal drip: Secondary | ICD-10-CM

## 2017-01-24 DIAGNOSIS — R05 Cough: Secondary | ICD-10-CM | POA: Diagnosis not present

## 2017-01-24 DIAGNOSIS — R059 Cough, unspecified: Secondary | ICD-10-CM

## 2017-01-24 MED ORDER — ALBUTEROL SULFATE HFA 108 (90 BASE) MCG/ACT IN AERS: 2.0000 | INHALATION_SPRAY | RESPIRATORY_TRACT | 0 refills | Status: DC | PRN

## 2017-01-24 MED ORDER — PREDNISONE 50 MG PO TABS: ORAL_TABLET | ORAL | 0 refills | Status: DC

## 2017-01-24 NOTE — ED Provider Notes (Signed)
CSN: [id number]     Arrival date & time 01/24/17  1401 History   First MD Initiated Contact with Patient 01/24/17 1619     Chief Complaint  Patient presents with  . Nasal Congestion   (Consider location/radiation/quality/duration/timing/severity/associated sxs/prior Treatment) 61 year old male complaining of approximately 1 week history of burning in the nose, sore throat, sensation of anterior upper chest congestion, sneezing, blowing his nose and occasional cough. He has been taking Mucinex without much relief. Denies any fevers at home. He is especially concerned because a couple years ago he had similar symptoms which resulted in having a cough for several months after being on several different medications including Allegra-D which apparently gave him terrible side effects causing him to get emergency department as a result of the decongestant component. He denies PND however he is consistently clearing his throat.      Past Medical History:  Diagnosis Date  . Anxiety   . Depressed    Past Surgical History:  Procedure Laterality Date  . TONSILLECTOMY  1961   Family History  Problem Relation Age of Onset  . Hypertension Mother   . Heart attack Father    Social History  Substance Use Topics  . Smoking status: Former Smoker    Types: Cigars  . Smokeless tobacco: Never Used  . Alcohol use 0.0 oz/week     Comment: beer occasionally    Review of Systems  Constitutional: Negative for activity change, diaphoresis, fatigue and fever.  HENT: Positive for rhinorrhea, sneezing and sore throat. Negative for ear pain, facial swelling, postnasal drip and trouble swallowing.   Eyes: Negative for pain, discharge and redness.  Respiratory: Positive for cough. Negative for chest tightness.   Cardiovascular: Negative.   Gastrointestinal: Negative.   Musculoskeletal: Negative.  Negative for neck pain and neck stiffness.  Neurological: Negative.     Allergies  Penicillins  Home  Medications   Prior to Admission medications   Medication Sig Start Date End Date Taking? Authorizing Provider  sertraline (ZOLOFT) 50 MG tablet Take 50 mg by mouth daily.   Yes [provider]  albuterol (PROVENTIL HFA;VENTOLIN HFA) 108 (90 Base) MCG/ACT inhaler Inhale 2 puffs into the lungs every 4 (four) hours as needed for wheezing or shortness of breath. 01/24/17   Janne Napoleon, NP  famotidine (PEPCID) 20 MG tablet One at bedtime 07/19/15   Tanda Rockers, MD  omeprazole (PRILOSEC) 40 MG capsule Take 1 capsule (40 mg total) by mouth daily. 07/19/15   Tanda Rockers, MD  predniSONE (DELTASONE) 50 MG tablet 1 tab po daily for 6 days. Take with food. 01/24/17   Janne Napoleon, NP   Meds Ordered and Administered this Visit  Medications - No data to display  BP (!) 156/92 (BP Location: Right Arm)   Pulse 60   Temp 99.1 F (37.3 C) (Oral)   Resp 16   SpO2 100%  No data found.   Physical Exam  Constitutional: He is oriented to person, place, and time. He appears well-developed and well-nourished. No distress.  HENT:  Mouth/Throat: No oropharyngeal exudate.  Bilateral TMs are normal. Oropharynx with minor erythema. No obvious drainage. No exudate.  Eyes: EOM are normal.  Neck: Normal range of motion. Neck supple.  Cardiovascular: Normal rate, regular rhythm and normal heart sounds.   Pulmonary/Chest: Effort normal and breath sounds normal. No respiratory distress. He has no wheezes.  Musculoskeletal: Normal range of motion. He exhibits no edema.  Lymphadenopathy:    He has no  cervical adenopathy.  Neurological: He is alert and oriented to person, place, and time.  Skin: Skin is warm and dry. No rash noted.  Psychiatric: He has a normal mood and affect.  Nursing note and vitals reviewed.   Urgent Care Course     Procedures (including critical care time)  Labs Review Labs Reviewed - No data to display  Imaging Review No results found.   Visual Acuity Review  Right  Eye Distance:   Left Eye Distance:   Bilateral Distance:    Right Eye Near:   Left Eye Near:    Bilateral Near:         MDM   1. Seasonal allergic rhinitis, unspecified trigger   2. Cough   3. PND (post-nasal drip)    The combination of symptoms she described are most consistent with allergies. Right now the lungs are clear however is possible that he may have occult bronchospasm which can calls an unexplained cough. Recommend using the albuterol inhaler 2 puffs every 4 hours only as needed for cough. You also prescription for prednisone to take if he felt like your allergy symptoms are getting worse. Sure to drink plenty fluids and stay well-hydrated. Take plain Allegra or Zyrtec for allergy symptoms. Sometimes steroid nasal spray such as Flonase or Rhinocort can help. Meds ordered this encounter  Medications  . albuterol (PROVENTIL HFA;VENTOLIN HFA) 108 (90 Base) MCG/ACT inhaler    Sig: Inhale 2 puffs into the lungs every 4 (four) hours as needed for wheezing or shortness of breath.    Dispense:  1 Inhaler    Refill:  0    Order Specific Question:   Supervising Provider    Answer:   Sherlene Shams [[id number]]  . predniSONE (DELTASONE) 50 MG tablet    Sig: 1 tab po daily for 6 days. Take with food.    Dispense:  6 tablet    Refill:  0    Order Specific Question:   Supervising Provider    Answer:   Sherlene Shams [[id number]]       Janne Napoleon, NP 01/24/17 1649

## 2017-01-24 NOTE — Discharge Instructions (Signed)
The combination of symptoms she described are most consistent with allergies. Right now the lungs are clear however is possible that he may have occult bronchospasm which can calls an unexplained cough. Recommend using the albuterol inhaler 2 puffs every 4 hours only as needed for cough. You also prescription for prednisone to take if he felt like your allergy symptoms are getting worse. Sure to drink plenty fluids and stay well-hydrated. Take plain Allegra or Zyrtec for allergy symptoms. Sometimes steroid nasal spray such as Flonase or Rhinocort can help.

## 2017-01-24 NOTE — ED Triage Notes (Signed)
Patient presents to [facility] with complaints of head and chest congestion x1 week, pt states he has a burning sensation in chest, pt has taken mucinex DM but has no relief

## 2018-02-03 DIAGNOSIS — Z860101 Personal history of adenomatous and serrated colon polyps: Secondary | ICD-10-CM

## 2018-02-03 HISTORY — DX: Personal history of adenomatous and serrated colon polyps: Z86.0101

## 2018-02-28 DIAGNOSIS — Z860101 Personal history of adenomatous and serrated colon polyps: Secondary | ICD-10-CM | POA: Insufficient documentation

## 2018-02-28 LAB — HM COLONOSCOPY

## 2019-05-24 ENCOUNTER — Telehealth: Payer: Self-pay | Admitting: *Deleted

## 2019-05-24 NOTE — Telephone Encounter (Signed)
Patient has been scheduled with Dr. Harrington Challenger.

## 2019-05-24 NOTE — Telephone Encounter (Signed)
Left message for patient to call back.  He needs an appointment with Dr. Harrington Challenger, per Dr. Harrington Challenger.

## 2019-07-07 DIAGNOSIS — I719 Aortic aneurysm of unspecified site, without rupture: Secondary | ICD-10-CM | POA: Insufficient documentation

## 2019-07-24 ENCOUNTER — Ambulatory Visit: Payer: BLUE CROSS/BLUE SHIELD | Admitting: Internal Medicine

## 2019-11-16 ENCOUNTER — Encounter: Payer: Self-pay | Admitting: Internal Medicine

## 2019-11-16 ENCOUNTER — Other Ambulatory Visit: Payer: Self-pay

## 2019-11-16 ENCOUNTER — Encounter (INDEPENDENT_AMBULATORY_CARE_PROVIDER_SITE_OTHER): Payer: Self-pay

## 2019-11-16 ENCOUNTER — Ambulatory Visit: Payer: Medicare HMO | Admitting: Internal Medicine

## 2019-11-16 VITALS — BP 120/82 | HR 60 | Ht 71.0 in | Wt 168.4 lb

## 2019-11-16 DIAGNOSIS — E785 Hyperlipidemia, unspecified: Secondary | ICD-10-CM | POA: Diagnosis not present

## 2019-11-16 DIAGNOSIS — Z0000 Encounter for general adult medical examination without abnormal findings: Secondary | ICD-10-CM | POA: Diagnosis not present

## 2019-11-16 MED ORDER — ROSUVASTATIN CALCIUM 20 MG PO TABS: 20.0000 mg | ORAL_TABLET | Freq: Every day | ORAL | 11 refills | Status: DC

## 2019-11-16 NOTE — Progress Notes (Signed)
Cardiology Office Note   Date:  11/16/2019   ID:  Eugene Guzman, DOB [birth year], MRN [id number]  PCP:  Albina Billet, MD  Cardiologist:   Dorris Carnes, MD   Patient presents for cardiac risk strafication      History of Present Illness: Eugene Guzman is a 64 y.o. male with no prior cardiac hx  Followed by Dr Hall Busing.   Presents today for cardiac risk assessment    FHx signif for MI in father   In 2015 had a CT scan of abdomen  This showed atherosclerosis of aorta    The pt says he is active   Rare SOB with hills    No CP        Current Meds  Medication Sig  . sertraline (ZOLOFT) 50 MG tablet Take 50 mg by mouth daily.  . [DISCONTINUED] albuterol (PROVENTIL HFA;VENTOLIN HFA) 108 (90 Base) MCG/ACT inhaler Inhale 2 puffs into the lungs every 4 (four) hours as needed for wheezing or shortness of breath.  . [DISCONTINUED] famotidine (PEPCID) 20 MG tablet One at bedtime  . [DISCONTINUED] omeprazole (PRILOSEC) 40 MG capsule Take 1 capsule (40 mg total) by mouth daily.  . [DISCONTINUED] predniSONE (DELTASONE) 50 MG tablet 1 tab po daily for 6 days. Take with food.     Allergies:   Penicillins   Past Medical History:  Diagnosis Date  . Anxiety   . Depressed     Past Surgical History:  Procedure Laterality Date  . TONSILLECTOMY  1961     Social History:  The patient  reports that he has quit smoking. His smoking use included cigars. He has never used smokeless tobacco. He reports current alcohol use. He reports that he does not use drugs.   Family History:  The patient's family history includes Heart attack in his father; Hypertension in his mother.    ROS:  Please see the history of present illness. All other systems are reviewed and  Negative to the above problem except as noted.    PHYSICAL EXAM: VS:  BP 120/82   Pulse 60   Ht 5\' 11"  (1.803 m)   Wt 168 lb 6.4 oz (76.4 kg)   BMI 23.49 kg/m   GEN: Well nourished, well developed, in no acute distress  HEENT:  normal  Neck: no JVD, no carotid bruits Cardiac: RRR; no murmurs, rubs, or gallops,no edema  Respiratory:  clear to auscultation bilaterally, normal work of breathing GI: soft, nontender, nondistended, + BS  No hepatomegaly  MS: no deformity Moving all extremities   Skin: warm and dry, no rash Neuro:  Strength and sensation are intact Psych: euthymic mood, full affect   EKG:  EKG is ordered today.  SR 60 bpm   Incomplete RBBB   LAD      Lipid Panel   Last lipds in May LDL 134  HDL 67  Trig 141   LDL in 2010 was 172   No results found for: CHOL, TRIG, HDL, CHOLHDL, VLDL, LDLCALC, LDLDIRECT    Wt Readings from Last 3 Encounters:  11/16/19 168 lb 6.4 oz (76.4 kg)  07/19/15 167 lb 6.4 oz (75.9 kg)  03/04/15 160 lb (72.6 kg)      ASSESSMENT AND PLAN:  1  Cardiac risk assessment.  Pt without symptoms to sugg angina   He had atherosclerosis on aorta several years ago    I would recomm a CT Calcium score to eval burden of plaque on coronary arteries and  further guide Rx    2   Lpids    With atherosclerosis noted on CT scan he needs tighter control of lpids   Would Rx with Crestor 20   F/U lipids in 8 wk with CK and liver panel  3  Blood pressure   Pt says today is lower than usual   I have asked him to follow    No changes for now    F/U based on results   Stay active      Current medicines are reviewed at length with the patient today.  The patient does not have concerns regarding medicines.  Signed, Dorris Carnes, MD  11/16/2019 11:37 AM    [facility] [address], [place], [place]  [address] Phone: [phone]; Fax: [phone]

## 2019-11-16 NOTE — Patient Instructions (Signed)
Medication Instructions: Your physician has recommended you make the following change in your medication:  START ROSUVASTATIN 20 MG 1 EVERY DAY  *If you need a refill on your cardiac medications before your next appointment, please call your pharmacy*   Lab Work: FASTIN LIPID LIVER AND CK IN 8 WEEKS If you have labs (blood work) drawn today and your tests are completely normal, you will receive your results only by: Marland Kitchen MyChart Message (if you have MyChart) OR . A paper copy in the mail If you have any lab test that is abnormal or we need to change your treatment, we will call you to review the results.   Testing/Procedures: CALCIUM SCORE OUT OF POCKET $150.00    Follow-Up: At [facility], you and your health needs are our priority.  As part of our continuing mission to provide you with exceptional heart care, we have created designated Provider Care Teams.  These Care Teams include your primary Cardiologist (physician) and Advanced Practice Providers (APPs -  Physician Assistants and Nurse Practitioners) who all work together to provide you with the care you need, when you need it.  We recommend signing up for the patient portal called "MyChart".  Sign up information is provided on this After Visit Summary.  MyChart is used to connect with patients for Virtual Visits (Telemedicine).  Patients are able to view lab/test results, encounter notes, upcoming appointments, etc.  Non-urgent messages can be sent to your provider as well.   To learn more about what you can do with MyChart, go to [email or web address].    Your next appointment: PENDING TEST RESULTS The format for your next appointment:

## 2019-11-24 ENCOUNTER — Other Ambulatory Visit: Payer: Medicare HMO

## 2019-11-27 ENCOUNTER — Other Ambulatory Visit: Payer: Self-pay

## 2019-11-27 ENCOUNTER — Ambulatory Visit (INDEPENDENT_AMBULATORY_CARE_PROVIDER_SITE_OTHER)
Admission: RE | Admit: 2019-11-27 | Discharge: 2019-11-27 | Disposition: A | Discharge status: Home / Self Care | Payer: Self-pay | Source: Ambulatory Visit | Attending: Internal Medicine | Admitting: Internal Medicine

## 2019-11-27 DIAGNOSIS — E785 Hyperlipidemia, unspecified: Secondary | ICD-10-CM

## 2019-12-05 ENCOUNTER — Telehealth: Payer: Self-pay | Admitting: Internal Medicine

## 2019-12-05 DIAGNOSIS — Z0000 Encounter for general adult medical examination without abnormal findings: Secondary | ICD-10-CM

## 2019-12-05 DIAGNOSIS — I7121 Aneurysm of the ascending aorta, without rupture: Secondary | ICD-10-CM

## 2019-12-05 DIAGNOSIS — E785 Hyperlipidemia, unspecified: Secondary | ICD-10-CM

## 2019-12-05 DIAGNOSIS — I700 Atherosclerosis of aorta: Secondary | ICD-10-CM

## 2019-12-05 NOTE — Telephone Encounter (Signed)
lpmtcb 6/1

## 2019-12-05 NOTE — Telephone Encounter (Signed)
New Message    Pts wife is calling and was wanting to schedule the pt for a stress test   Please advise

## 2019-12-06 ENCOUNTER — Encounter: Payer: Self-pay | Admitting: *Deleted

## 2019-12-06 ENCOUNTER — Other Ambulatory Visit: Payer: Self-pay

## 2019-12-06 ENCOUNTER — Telehealth (HOSPITAL_COMMUNITY): Payer: Self-pay | Admitting: Internal Medicine

## 2019-12-06 ENCOUNTER — Telehealth (HOSPITAL_COMMUNITY): Payer: Self-pay | Admitting: *Deleted

## 2019-12-06 NOTE — Telephone Encounter (Signed)
Left message on voicemail per DPR in reference to upcoming appointment scheduled on 12/13/19 with detailed instructions given per Myocardial Perfusion Study Information Sheet for the test. LM to arrive 15 minutes early, and that it is imperative to arrive on time for appointment to keep from having the test rescheduled. If you need to cancel or reschedule your appointment, please call the office within 24 hours of your appointment. Failure to do so may result in a cancellation of your appointment, and a $50 no show fee. Phone number given for call back for any questions. Kirstie Peri

## 2019-12-06 NOTE — Telephone Encounter (Signed)
Wife of the patient called back to schedule stress test and appointment with Dr. Roxan Hockey. Informed the wife that the orders for the stress test were not in the patients chart yet. I provided the wife with the phone number for Dr. Roxan Hockey as well

## 2019-12-06 NOTE — Telephone Encounter (Signed)
Called patient to schedule LSST for ASAP per Dr. Harrington Challenger. I have scheduled patient for June 9 arrive at 7 for a 7:15. This is  the soonest. I left message for patient to call office for date an time of test.

## 2019-12-06 NOTE — Progress Notes (Unsigned)
How to prepare for your Myocardial Perfusion Test: . Do not eat or drink 3 hours prior to your test, except you may have water. . Do not consume products containing caffeine (regular or decaffeinated) 12 hours prior to your test. (ex: coffee, chocolate, sodas, tea). Do bring a list of your current medications with you.  If not listed below, you may take your medications as normal. . Do NOT wear cologne, perfume, aftershave, or lotions (deodorant is allowed). . If these instructions are not followed, your test will have to be rescheduled.

## 2019-12-06 NOTE — Telephone Encounter (Addendum)
Fay Records, MD  12/05/2019 9:24 AM EDT    Reviewed results with pt and wife Recomm: COntinue Crestor Start 81 mg ecASA Would recomm [employer] ([address]) Would refer to [facility] for aortic aneurysm eval   From recent result note: see above   Orders placed for the above. Pt made aware. Sent to [facility] for immediate scheduling. Pt aware that [facility] will provide written Lexiscan instructions via mail/pickup once Lexi has been scheduled.   The following was reviewed with the patients wife--   How to prepare for your Myocardial Perfusion Test: . Do not eat or drink 3 hours prior to your test, except you may have water. . Do not consume products containing caffeine (regular or decaffeinated) 12 hours prior to your test. (ex: coffee, chocolate, sodas, tea). Do bring a list of your current medications with you.  If not listed below, you may take your medications as normal. . Do NOT wear cologne, perfume, aftershave, or lotions (deodorant is allowed). . If these instructions are not followed, your test will have to be rescheduled.

## 2019-12-07 ENCOUNTER — Telehealth: Payer: Self-pay | Admitting: Internal Medicine

## 2019-12-07 NOTE — Telephone Encounter (Signed)
Patient's wife is requesting to reschedule stress test and COVID-19 test. Please call to assist.

## 2019-12-07 NOTE — Telephone Encounter (Signed)
I spoke to the patient's wife and they decided to keep the 6/9 stress test appointment.  They thanked me for the call.

## 2019-12-13 ENCOUNTER — Ambulatory Visit (HOSPITAL_COMMUNITY): Payer: Medicare HMO | Attending: Cardiovascular Disease

## 2019-12-13 ENCOUNTER — Other Ambulatory Visit: Payer: Self-pay

## 2019-12-13 ENCOUNTER — Encounter (INDEPENDENT_AMBULATORY_CARE_PROVIDER_SITE_OTHER): Payer: Self-pay

## 2019-12-13 DIAGNOSIS — E785 Hyperlipidemia, unspecified: Secondary | ICD-10-CM | POA: Diagnosis present

## 2019-12-13 DIAGNOSIS — I712 Thoracic aortic aneurysm, without rupture: Secondary | ICD-10-CM | POA: Diagnosis present

## 2019-12-13 DIAGNOSIS — Z0000 Encounter for general adult medical examination without abnormal findings: Secondary | ICD-10-CM | POA: Insufficient documentation

## 2019-12-13 DIAGNOSIS — I700 Atherosclerosis of aorta: Secondary | ICD-10-CM | POA: Insufficient documentation

## 2019-12-13 DIAGNOSIS — I7121 Aneurysm of the ascending aorta, without rupture: Secondary | ICD-10-CM

## 2019-12-13 LAB — MYOCARDIAL PERFUSION IMAGING
LV dias vol: 97 mL (ref 62–150)
LV sys vol: 34 mL
Peak HR: 68 {beats}/min
Rest HR: 50 {beats}/min
SDS: 1
SRS: 0
SSS: 1
TID: 1.05

## 2019-12-13 MED ORDER — TECHNETIUM TC 99M TETROFOSMIN IV KIT
31.9000 | PACK | Freq: Once | INTRAVENOUS | Status: AC | PRN
  Administered 2019-12-13: 31.9 via INTRAVENOUS
  Filled 2019-12-13: qty 32

## 2019-12-13 MED ORDER — REGADENOSON 0.4 MG/5ML IV SOLN
0.4000 mg | Freq: Once | INTRAVENOUS | Status: AC
  Administered 2019-12-13: 0.4 mg via INTRAVENOUS

## 2019-12-13 MED ORDER — TECHNETIUM TC 99M TETROFOSMIN IV KIT
9.9000 | PACK | Freq: Once | INTRAVENOUS | Status: AC | PRN
  Administered 2019-12-13: 9.9 via INTRAVENOUS
  Filled 2019-12-13: qty 10

## 2019-12-26 ENCOUNTER — Encounter: Payer: Self-pay | Admitting: Thoracic Surgery (Cardiothoracic Vascular Surgery)

## 2019-12-26 ENCOUNTER — Other Ambulatory Visit: Payer: Self-pay

## 2019-12-26 ENCOUNTER — Institutional Professional Consult (permissible substitution): Payer: Medicare HMO | Admitting: Thoracic Surgery (Cardiothoracic Vascular Surgery)

## 2019-12-26 VITALS — BP 164/80 | HR 80 | Resp 20 | Ht 70.0 in | Wt 158.0 lb

## 2019-12-26 DIAGNOSIS — I7121 Aneurysm of the ascending aorta, without rupture: Secondary | ICD-10-CM

## 2019-12-26 DIAGNOSIS — I712 Thoracic aortic aneurysm, without rupture: Secondary | ICD-10-CM | POA: Diagnosis not present

## 2019-12-26 NOTE — Progress Notes (Signed)
PCP is Albina Billet, MD Referring Provider is Fay Records, MD  Chief Complaint  Patient presents with   Thoracic Aortic Aneurysm    Surgical eval, Chest CT 11/27/19    HPI: Eugene Guzman is sent for consultation regarding an ascending aortic aneurysm.  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  Eugene Guzman recently saw Dr. Harrington Challenger for assessment of coronary risk.  A CT for coronary calcium score was done which showed a high score at 97th percentile.  Eugene Guzman also was noted to have a 4.5 cm ascending aneurysm.  A stress test showed normal cardiac function with no evidence of ischemia.  Eugene Guzman has not been having any chest pain, pressure, or tightness with exertion.  Eugene Guzman sometimes notices shortness of breath when Eugene Guzman walks uphill and sand at the beach after sitting down for a while.  Eugene Guzman has wife have been checking his blood pressure regularly at home and his systolic pressures have been between 100 and 125. Past Medical History:  Diagnosis Date   Anxiety    Depressed     Past Surgical History:  Procedure Laterality Date   TONSILLECTOMY  1961    Family History  Problem Relation Age of Onset   Hypertension Mother    Heart attack Father     Social History Social History   Tobacco Use   Smoking status: Former Smoker    Types: Cigars   Smokeless tobacco: Never Used  Substance Use Topics   Alcohol use: Yes    Alcohol/week: 0.0 standard drinks    Comment: beer occasionally   Drug use: No    Current Outpatient Medications  Medication Sig Dispense Refill   aspirin EC 81 MG tablet Take 81 mg by mouth daily. Swallow whole.     rosuvastatin (CRESTOR) 20 MG tablet Take 1 tablet (20 mg total) by mouth daily. 30 tablet 11   sertraline (ZOLOFT) 50 MG tablet Take 50 mg by mouth daily.     No current facility-administered medications for this visit.    Allergies  Allergen  Reactions   Penicillins     Review of Systems  Constitutional: Negative for activity change and unexpected weight change.  HENT: Negative for trouble swallowing and voice change.   Eyes: Negative for visual disturbance.  Respiratory: Positive for shortness of breath. Negative for wheezing.   Cardiovascular: Negative for chest pain, palpitations and leg swelling.  Gastrointestinal: Negative for abdominal distention and abdominal pain.  Genitourinary: Positive for frequency.       Enlarged prostate  Musculoskeletal: Positive for arthralgias and joint swelling.  Neurological: Negative for syncope and weakness.  Psychiatric/Behavioral: Positive for dysphoric mood. The patient is nervous/anxious.   All other systems reviewed and are negative.   BP (!) 164/80    Pulse 80    Resp 20    Ht 5\' 10"  (1.778 m)    Wt 158 lb (71.7 kg)    SpO2 98% Comment: RA   BMI 22.67 kg/m  Physical Exam Constitutional:      General: Eugene Guzman is not in acute distress.    Appearance: Normal appearance.  HENT:     Head: Normocephalic and atraumatic.  Eyes:     General: No scleral icterus.    Extraocular Movements: Extraocular movements intact.  Neck:     Vascular: No carotid bruit.  Cardiovascular:     Rate and Rhythm: Normal rate and regular rhythm.  Pulses: Normal pulses.     Heart sounds: Normal heart sounds. No murmur heard.  No friction rub. No gallop.   Pulmonary:     Effort: Pulmonary effort is normal. No respiratory distress.     Breath sounds: Normal breath sounds. No wheezing or rales.  Abdominal:     General: There is no distension.     Palpations: Abdomen is soft.     Tenderness: There is no abdominal tenderness.  Musculoskeletal:        General: No swelling.     Cervical back: Neck supple.  Lymphadenopathy:     Cervical: No cervical adenopathy.  Skin:    General: Skin is warm and dry.  Neurological:     General: No focal deficit present.     Mental Status: Eugene Guzman is alert and oriented to  person, place, and time.     Cranial Nerves: No cranial nerve deficit.     Motor: No weakness.    Diagnostic Tests: OVER-READ INTERPRETATION  CT CHEST  The following report is an over-read performed by radiologist Dr. Rolm Baptise of [facility], [place] on 11/27/2019. This over-read does not include interpretation of cardiac or coronary anatomy or pathology. The coronary calcium score interpretation by the cardiologist is attached.  COMPARISON:  None.  FINDINGS: Vascular: Heart is normal size. Ascending aortic aneurysm measures up to 4.5 cm. Atherosclerotic calcifications in the aortic root and descending thoracic aorta.  Mediastinum/Nodes: No adenopathy  Lungs/Pleura: Visualized lungs clear.  No effusions.  Upper Abdomen: Imaging into the upper abdomen shows no acute findings.  Musculoskeletal: Chest wall soft tissues are unremarkable. No acute bony abnormality.  IMPRESSION: 4.5 cm ascending thoracic aortic aneurysm. Recommend semi-annual imaging followup by CTA or MRA and referral to cardiothoracic surgery if not already obtained. This recommendation follows 2010 ACCF/AHA/AATS/ACR/ASA/SCA/SCAI/SIR/STS/SVM Guidelines for the Diagnosis and Management of Patients With Thoracic Aortic Disease. Circulation. 2010; 121: V564-P329. Aortic aneurysm NOS (ICD10-I71.9)  Electronically Signed: By: Rolm Baptise M.D. On: 11/27/2019 15:36 I personally reviewed the CT images and concur with the findings noted above.  Severe coronary calcification and 4.5 cm ascending aneurysm.  Impression: Eugene Guzman is a 64 year old gentleman with a past history of anxiety, depression, and arthritis, who has been found to have coronary calcification by CT scan and a 4.5 cm ascending aneurysm.  Coronary artery calcification-high coronary calcium score, 97th percentile for age.  No evidence of ischemia on stress test.  Medical management per Dr. Harrington Challenger with Crestor.  Ascending thoracic  aortic aneurysm-4.5 cm.  No indication for surgery at the present time.  The indications for surgery would be a diameter of 5.5 cm or an increase of 5 mm in 6 months.  Blood pressure control is vital.  His blood pressure was significantly elevated on his visit today with a systolic of 518.  There is probably an element of whitecoat hypertension and that.  Eugene Guzman and his wife have been keeping a record of his blood pressure at home and those have been all within normal limits.  Needs to continue to monitor blood pressure.  They had a lot of questions about diet and exercise and I answered those for them as best I could.  Plan: Continue to monitor blood pressure at home on a regular basis.  Notify if systolic greater than 841 Return in 6 months with CT angiogram of chest  Melrose Nakayama, MD [facility] [phone]

## 2020-01-12 ENCOUNTER — Other Ambulatory Visit: Payer: Medicare HMO | Admitting: *Deleted

## 2020-01-12 ENCOUNTER — Other Ambulatory Visit: Payer: Self-pay

## 2020-01-12 DIAGNOSIS — E785 Hyperlipidemia, unspecified: Secondary | ICD-10-CM

## 2020-01-12 LAB — HEPATIC FUNCTION PANEL
ALT: 48 IU/L — ABNORMAL HIGH (ref 0–44)
AST: 44 IU/L — ABNORMAL HIGH (ref 0–40)
Albumin: 4.9 g/dL — ABNORMAL HIGH (ref 3.8–4.8)
Alkaline Phosphatase: 77 IU/L (ref 48–121)
Bilirubin Total: 0.4 mg/dL (ref 0.0–1.2)
Bilirubin, Direct: 0.17 mg/dL (ref 0.00–0.40)
Total Protein: 7 g/dL (ref 6.0–8.5)

## 2020-01-12 LAB — LIPID PANEL
Chol/HDL Ratio: 2 ratio (ref 0.0–5.0)
Cholesterol, Total: 172 mg/dL (ref 100–199)
HDL: 85 mg/dL (ref >39)
LDL Chol Calc (NIH): 73 mg/dL (ref 0–99)
Triglycerides: 75 mg/dL (ref 0–149)
VLDL Cholesterol Cal: 14 mg/dL (ref 5–40)

## 2020-01-15 ENCOUNTER — Telehealth: Payer: Self-pay | Admitting: Internal Medicine

## 2020-01-15 DIAGNOSIS — R7989 Other specified abnormal findings of blood chemistry: Secondary | ICD-10-CM

## 2020-01-15 NOTE — Telephone Encounter (Signed)
Patient's wife calling to schedule follow up lab work, but the orders are not in yet. She would like a call back or a mychart message to notify her when they are put in.

## 2020-01-16 NOTE — Telephone Encounter (Signed)
Fay Records, MD  01/15/2020 3:00 PM EDT     Lipids are better  LDL 73  HDL 85   Minimal elevation of AST/ALT    REcomm:  Keep on same doses of meds  Watch diet  Goal of LDL is still below 70 The LFT elevation is trivial  I would repeat liver panel and GGT in 3 wks    Unable to reach patient

## 2020-01-16 NOTE — Telephone Encounter (Signed)
Orders for repeat LFTs and GGt placed for in 3 weeks. Message to patient via my Chart Appointment made for 02/06/20.

## 2020-02-02 ENCOUNTER — Other Ambulatory Visit: Payer: Self-pay

## 2020-02-02 ENCOUNTER — Other Ambulatory Visit: Payer: Medicare HMO

## 2020-02-02 ENCOUNTER — Other Ambulatory Visit: Payer: Medicare HMO | Admitting: *Deleted

## 2020-02-02 DIAGNOSIS — R7989 Other specified abnormal findings of blood chemistry: Secondary | ICD-10-CM

## 2020-02-02 LAB — HEPATIC FUNCTION PANEL
ALT: 38 IU/L (ref 0–44)
AST: 36 IU/L (ref 0–40)
Albumin: 4.7 g/dL (ref 3.8–4.8)
Alkaline Phosphatase: 79 IU/L (ref 48–121)
Bilirubin Total: 0.7 mg/dL (ref 0.0–1.2)
Bilirubin, Direct: 0.22 mg/dL (ref 0.00–0.40)
Total Protein: 6.8 g/dL (ref 6.0–8.5)

## 2020-02-02 LAB — GAMMA GT: GGT: 99 IU/L — ABNORMAL HIGH (ref 0–65)

## 2020-02-06 ENCOUNTER — Telehealth: Payer: Self-pay | Admitting: *Deleted

## 2020-02-06 ENCOUNTER — Other Ambulatory Visit: Payer: Medicare HMO

## 2020-02-06 DIAGNOSIS — R945 Abnormal results of liver function studies: Secondary | ICD-10-CM

## 2020-02-06 DIAGNOSIS — R7989 Other specified abnormal findings of blood chemistry: Secondary | ICD-10-CM

## 2020-02-06 NOTE — Telephone Encounter (Addendum)
-----   Message from Fay Records, MD sent at 02/05/2020  2:15 PM EDT ----- Spoke with pt   He will stop Crestor      Repeate Liver panel and GGT in 3 to 4 wks    Keep on other meds , beer as usual __________________________________________________________________________________  Medication list updated.  Lab orders placed. MyChart message to patient to find out what day he would like to schedule labs.

## 2020-02-28 ENCOUNTER — Other Ambulatory Visit: Payer: Self-pay

## 2020-02-28 ENCOUNTER — Other Ambulatory Visit: Payer: Medicare HMO | Admitting: *Deleted

## 2020-02-28 DIAGNOSIS — R7989 Other specified abnormal findings of blood chemistry: Secondary | ICD-10-CM

## 2020-02-28 LAB — HEPATIC FUNCTION PANEL
ALT: 32 IU/L (ref 0–44)
AST: 30 IU/L (ref 0–40)
Albumin: 4.7 g/dL (ref 3.8–4.8)
Alkaline Phosphatase: 75 IU/L (ref 48–121)
Bilirubin Total: 0.6 mg/dL (ref 0.0–1.2)
Bilirubin, Direct: 0.21 mg/dL (ref 0.00–0.40)
Total Protein: 7.1 g/dL (ref 6.0–8.5)

## 2020-02-28 LAB — GAMMA GT: GGT: 99 IU/L — ABNORMAL HIGH (ref 0–65)

## 2020-03-06 ENCOUNTER — Telehealth: Payer: Self-pay | Admitting: *Deleted

## 2020-03-06 DIAGNOSIS — E785 Hyperlipidemia, unspecified: Secondary | ICD-10-CM

## 2020-03-06 MED ORDER — ROSUVASTATIN CALCIUM 10 MG PO TABS
10.0000 mg | ORAL_TABLET | Freq: Every day | ORAL | 3 refills | Status: DC
Start: 2020-03-06 — End: 2020-10-07

## 2020-03-06 NOTE — Telephone Encounter (Signed)
crestor added back to med list. Lab orders placed for 11/2020.

## 2020-03-06 NOTE — Telephone Encounter (Signed)
-----   Message from Fay Records, MD sent at 02/29/2020 10:11 PM EDT ----- I have reviewed liver function test with pt     I have also reviewed with [facility]   He said GGT is very very sensitive   Does not follow Told him about mild elevation of AST/ALT during crestor trial   He said ok to try again   WOuld not stop For now I would get pt to take 10 mg daily   F/U lipids and liver panel (not GGT) in 8 months

## 2020-03-13 ENCOUNTER — Telehealth: Payer: Self-pay | Admitting: Internal Medicine

## 2020-03-13 NOTE — Telephone Encounter (Signed)
Pt is calling to talk with Michalene regarding his medication. Please call back

## 2020-03-13 NOTE — Telephone Encounter (Signed)
Left messages on home and mobile numbers for patient to call back. His med list includes zoloft 50 mg and there are other meds in outside med list. (buspar and xanax).  Need to clarify with patient he is referring to these medications.  Liver functions are normal.  Plan is to repeat LFTs next May.  His appointment is scheduled.

## 2020-03-13 NOTE — Telephone Encounter (Signed)
Reviewed with Dr. Harrington Challenger. Will review with Fuller [place], Neligh re: restarting alprazolam and Buspar --meds listed in outside medication list.

## 2020-03-14 NOTE — Telephone Encounter (Signed)
Spoke with patient and his wife. Adv that per Dr. Harrington Challenger whom I reviewed with, and per [facility], it is ok to resume both xanax and buspar.  Medication list has been updated.

## 2020-03-14 NOTE — Telephone Encounter (Signed)
Ok to resume both xanax and buspar

## 2020-05-17 ENCOUNTER — Other Ambulatory Visit: Payer: Self-pay | Admitting: *Deleted

## 2020-05-17 DIAGNOSIS — I712 Thoracic aortic aneurysm, without rupture, unspecified: Secondary | ICD-10-CM

## 2020-06-24 ENCOUNTER — Ambulatory Visit
Admission: RE | Admit: 2020-06-24 | Discharge: 2020-06-24 | Disposition: A | Discharge status: Home / Self Care | Payer: Medicare HMO | Source: Ambulatory Visit | Attending: Thoracic Surgery (Cardiothoracic Vascular Surgery) | Admitting: Thoracic Surgery (Cardiothoracic Vascular Surgery)

## 2020-06-24 DIAGNOSIS — I712 Thoracic aortic aneurysm, without rupture, unspecified: Secondary | ICD-10-CM

## 2020-06-24 MED ORDER — IOPAMIDOL (ISOVUE-370) INJECTION 76%
75.0000 mL | Freq: Once | INTRAVENOUS | Status: AC | PRN
  Administered 2020-06-24: 75 mL via INTRAVENOUS

## 2020-07-02 ENCOUNTER — Other Ambulatory Visit: Payer: Self-pay | Admitting: *Deleted

## 2020-07-02 ENCOUNTER — Ambulatory Visit: Payer: Medicare HMO | Admitting: Thoracic Surgery (Cardiothoracic Vascular Surgery)

## 2020-07-02 ENCOUNTER — Encounter: Payer: Self-pay | Admitting: Thoracic Surgery (Cardiothoracic Vascular Surgery)

## 2020-07-02 ENCOUNTER — Other Ambulatory Visit: Payer: Self-pay

## 2020-07-02 VITALS — BP 121/68 | HR 60 | Resp 20 | Ht 70.0 in | Wt 160.0 lb

## 2020-07-02 DIAGNOSIS — I712 Thoracic aortic aneurysm, without rupture, unspecified: Secondary | ICD-10-CM

## 2020-07-02 DIAGNOSIS — D1803 Hemangioma of intra-abdominal structures: Secondary | ICD-10-CM

## 2020-07-02 MED ORDER — METOPROLOL SUCCINATE ER 25 MG PO TB24: 25.0000 mg | ORAL_TABLET | Freq: Every day | ORAL | 5 refills | Status: DC

## 2020-07-02 NOTE — Progress Notes (Signed)
[address]       Eugene Guzman [address]             [phone]       HPI: Eugene Guzman returns for a scheduled follow-up  Eugene "Eugene Guzman" Guzman is a 64 year old man with a past history significant for anxiety, depression, family history of coronary disease, coronary calcification by CT, dyslipidemia, and borderline hypertension. He had a CT for coronary calcium score done earlier this year. His calcium score was in the 97th percentile but a stress test showed no evidence of ischemia. 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  Over the past 6 months he has been having a lot of anxiety. His medications were recently changed. He is not having any chest pain, pressure, or tightness. Not short of breath. His blood pressures have been consistently above 140 in the mornings and often above normal in the evenings as well. His wife has been tracking those.  Past Medical History:  Diagnosis Date  . Anxiety   . Depressed     Current Outpatient Medications  Medication Sig Dispense Refill  . ALPRAZolam (XANAX) 1 MG tablet Take 1 mg by mouth daily as needed.    Marland Kitchen aspirin EC 81 MG tablet Take 81 mg by mouth daily. Swallow whole.    . metoprolol succinate (TOPROL XL) 25 MG 24 hr tablet Take 1 tablet (25 mg total) by mouth daily. 30 tablet 5  . rosuvastatin (CRESTOR) 10 MG tablet Take 1 tablet (10 mg total) by mouth daily. 90 tablet 3  . busPIRone (BUSPAR) 5 MG tablet Take 5 mg by mouth 2 (two) times daily.    . sertraline (ZOLOFT) 50 MG tablet Take 25 mg by mouth daily.     No current facility-administered medications for this visit.    Physical Exam BP 121/68   Pulse 60   Resp 20   Ht 5\' 10"  (1.778 m)   Wt 160 lb (72.6 kg)   SpO2 97% Comment: RA  BMI 22.25 kg/m  64 year old man in no acute distress Alert and oriented x3 with no focal deficits No carotid bruits Cardiac regular rate and rhythm normal S1 and S2 Lungs clear with equal breath sounds  bilaterally No peripheral edema  Diagnostic Tests: CT ANGIOGRAPHY CHEST WITH CONTRAST  TECHNIQUE: Multidetector CT imaging of the chest was performed using the standard protocol during bolus administration of intravenous contrast. Multiplanar CT image reconstructions and MIPs were obtained to evaluate the vascular anatomy.  CONTRAST:  69mL ISOVUE-370 IOPAMIDOL (ISOVUE-370) INJECTION 76%  COMPARISON:  11/27/2019  FINDINGS: Cardiovascular: Thoracic aorta demonstrates atherosclerotic calcifications. Dilatation of the ascending aorta to 4.6 cm is noted stable in appearance from the prior exam. Sinus of Valsalva at the aortic root measures 5.1 cm. Sino-tubular junction measures approximately 3.6 cm. Normal tapering is noted in the thoracic aortic arch in the descending thoracic aorta shows no aneurysmal dilatation or significant atherosclerotic abnormality. No dissection is seen. No cardiac enlargement is noted. Heavy coronary calcifications are noted similar to that seen on prior cardiac scoring. The pulmonary artery as visualized is within normal limits.  Mediastinum/Nodes: Thoracic inlet is within normal limits. No sizable hilar or mediastinal adenopathy is noted. The esophagus as visualized is within normal limits.  Lungs/Pleura: Lungs are well aerated bilaterally. No focal infiltrate or sizable effusion is seen. No sizable parenchymal nodule is noted.  Upper Abdomen: There is a 2.1 cm faintly enhancing lesion within the right  lobe of the liver best seen on image number 143 of series 4. This was not well appreciated on prior exam in 2015 but likely represents a hepatic hemangioma. Remainder of the upper abdomen appears within normal limits.  Musculoskeletal: Mild degenerative changes of the thoracic spine are noted. Old healed rib fractures are noted on the left.  Review of the MIP images confirms the above findings.  IMPRESSION: Dilatation of the ascending  aorta as described above relatively stable from the prior exam. Ascending thoracic aortic aneurysm. Recommend semi-annual imaging followup by CTA or MRA and referral to cardiothoracic surgery if not already obtained. This recommendation follows 2010 ACCF/AHA/AATS/ACR/ASA/SCA/SCAI/SIR/STS/SVM Guidelines for the Diagnosis and Management of Patients With Thoracic Aortic Disease. Circulation. 2010; 121: S937-D428. Aortic aneurysm NOS (ICD10-I71.9)  Likely hemangioma in the right lobe of the liver. Short-term ultrasound follow-up could be performed as clinically indicated.  No other focal abnormality is seen.  Aortic Atherosclerosis (ICD10-I70.0).   Electronically Signed   By: Alcide Clever M.D.   On: 06/24/2020 09:29  I personally reviewed the CT images and concur with the findings noted above  Impression: Eugene Guzman is a 64 year old man with a history of coronary calcification, dyslipidemia, anxiety, depression, and borderline hypertension.  Ascending aneurysm/thoracic aortic atherosclerosis he was found to have a 4.5 cm ascending aneurysm about 6 months ago on a cardiac CT. It is stable on today's exam. He needs continued semiannual follow-up. He is on a statin.  Hypertension-consistently above 140 early in the day and often in the evenings as well. Given that he has an aneurysm and some coronary disease I think he would benefit from being on a low-dose beta-blocker. We'll start Toprol-XL 25 mg daily.  Liver lesion noted on CT-likely hemangioma but will get an ultrasound to confirm  Plan: Hepatic ultrasound to evaluate finding on CT Start Toprol-XL 25 mg daily Return in 6 months with CT angio of chest  I spent over 20 minutes in review of images, records, and in consultation with Mr. Ostermiller today. Loreli Slot, MD [facility] [phone]

## 2020-07-16 DIAGNOSIS — R69 Illness, unspecified: Secondary | ICD-10-CM | POA: Diagnosis not present

## 2020-07-16 DIAGNOSIS — F410 Panic disorder [episodic paroxysmal anxiety] without agoraphobia: Secondary | ICD-10-CM | POA: Diagnosis not present

## 2020-07-19 ENCOUNTER — Ambulatory Visit
Admission: RE | Admit: 2020-07-19 | Discharge: 2020-07-19 | Disposition: A | Discharge status: Home / Self Care | Payer: Medicare HMO | Source: Ambulatory Visit | Attending: Thoracic Surgery (Cardiothoracic Vascular Surgery) | Admitting: Thoracic Surgery (Cardiothoracic Vascular Surgery)

## 2020-07-19 ENCOUNTER — Telehealth: Payer: Self-pay | Admitting: Thoracic Surgery (Cardiothoracic Vascular Surgery)

## 2020-07-19 DIAGNOSIS — D1809 Hemangioma of other sites: Secondary | ICD-10-CM | POA: Diagnosis not present

## 2020-07-19 DIAGNOSIS — D1803 Hemangioma of intra-abdominal structures: Secondary | ICD-10-CM

## 2020-07-19 NOTE — Telephone Encounter (Signed)
I called [name] to inform him US showed no suspicious liver lesions.   ULTRASOUND ABDOMEN LIMITED RIGHT UPPER QUADRANT  COMPARISON:  CTA 06/24/2020  FINDINGS: Gallbladder:  No gallstones or wall thickening visualized. No sonographic Murphy sign noted by sonographer.  Common bile duct:  Diameter: Normal, 3 mm.  Liver:  Normal in echogenicity. The high right hepatic lobe hyperenhancing lesion is not identified. No suspicious liver lesions seen. Portal vein is patent on color Doppler imaging with normal direction of blood flow towards the liver.  Other: No ascites.  IMPRESSION: No focal liver lesion identified. No suspicious liver lesions seen. Presuming no history of primary malignancy or liver disease, this can be presumed a hemangioma. If any such history, consider follow-up with pre and post contrast abdominal MRI.   Electronically Signed   By: Abigail Miyamoto M.D.   On: 07/19/2020 13:40   Eugene Guzman. Roxan Hockey, MD [facility] [phone]

## 2020-10-07 ENCOUNTER — Telehealth: Payer: Self-pay | Admitting: Internal Medicine

## 2020-10-07 MED ORDER — ROSUVASTATIN CALCIUM 10 MG PO TABS: 10.0000 mg | ORAL_TABLET | Freq: Every day | ORAL | 0 refills | Status: DC

## 2020-10-07 NOTE — Telephone Encounter (Signed)
New Message:   Pt lost his Rosuvastatin medicine. He would like a new prescription sent to a new pharmacy.    *STAT* If patient is at the pharmacy, call can be transferred to refill team.   1. Which medications need to be refilled? (please list name of each medication and dose if known)  Rosuvastatin 10 mg  2. Which pharmacy/location (including street and city if local pharmacy) is medication to be sent to? [facility], [place]  3. Do they need a 30 day or 90 day supply?  90 days and refills

## 2020-10-07 NOTE — Telephone Encounter (Signed)
Pt's medication was sent to pt's pharmacy as requested. Confirmation received.  °

## 2020-11-04 ENCOUNTER — Other Ambulatory Visit: Payer: Medicare HMO

## 2020-11-05 ENCOUNTER — Other Ambulatory Visit: Payer: Self-pay

## 2020-11-05 ENCOUNTER — Other Ambulatory Visit: Payer: Medicare HMO | Admitting: *Deleted

## 2020-11-05 DIAGNOSIS — E785 Hyperlipidemia, unspecified: Secondary | ICD-10-CM

## 2020-11-05 LAB — HEPATIC FUNCTION PANEL
ALT: 22 IU/L (ref 0–44)
AST: 22 IU/L (ref 0–40)
Albumin: 4.9 g/dL — ABNORMAL HIGH (ref 3.8–4.8)
Alkaline Phosphatase: 85 IU/L (ref 44–121)
Bilirubin Total: 0.6 mg/dL (ref 0.0–1.2)
Bilirubin, Direct: 0.16 mg/dL (ref 0.00–0.40)
Total Protein: 6.8 g/dL (ref 6.0–8.5)

## 2020-11-05 LAB — LIPID PANEL
Chol/HDL Ratio: 2.3 ratio (ref 0.0–5.0)
Cholesterol, Total: 180 mg/dL (ref 100–199)
HDL: 77 mg/dL (ref >39)
LDL Chol Calc (NIH): 80 mg/dL (ref 0–99)
Triglycerides: 137 mg/dL (ref 0–149)
VLDL Cholesterol Cal: 23 mg/dL (ref 5–40)

## 2020-11-08 ENCOUNTER — Telehealth: Payer: Self-pay | Admitting: *Deleted

## 2020-11-08 NOTE — Telephone Encounter (Signed)
Mrs. Goffe contacted the office requesting help getting patient Eugene Guzman in with a PCP. Provided wife with [place] phone number to aid in her search. No further questions at this time.

## 2020-11-18 ENCOUNTER — Telehealth: Payer: Self-pay | Admitting: *Deleted

## 2020-11-18 DIAGNOSIS — E785 Hyperlipidemia, unspecified: Secondary | ICD-10-CM

## 2020-11-18 MED ORDER — ROSUVASTATIN CALCIUM 20 MG PO TABS: 20.0000 mg | ORAL_TABLET | Freq: Every day | ORAL | 3 refills | Status: DC

## 2020-11-18 NOTE — Telephone Encounter (Signed)
-----   Message from Dorris Carnes V, MD sent at 11/11/2020  9:56 PM EDT ----- LDL is a little higher than it should be  Was 44 before   Now 80 I would recomm increasing Crestor to 20 mg    Check lipomed with LDL in 2 months     Re PCP:   Billey Gosling is a PCP that may be taking new patients   With [facility] on [address]

## 2020-11-18 NOTE — Telephone Encounter (Signed)
Spoke with patient about increasing Crestor to 20 mg and repeat labs in June.  Information shared about possible PCP Billey Gosling also, she might be taking new patients.  Patient appreciative of phone call.

## 2020-11-18 NOTE — Telephone Encounter (Signed)
Dr. Harrington Challenger reviewed your labs: "LDL is a little higher than it should be Was 29 before.  Now 45.  I would recommend increasing Crestor to  20 mg.  Check labs (lipomed panel with LDL) in 2 months.  Re PCP:  Billey Gosling is a PCP that may be taking new patients.  She is with [facility] on [facility]."   Left message for pt detailed (DPR) and sent results through my chart.

## 2020-11-18 NOTE — Telephone Encounter (Signed)
Patient's wife calling back stating she did receive the message.

## 2020-11-19 ENCOUNTER — Other Ambulatory Visit: Payer: Self-pay | Admitting: *Deleted

## 2020-11-19 DIAGNOSIS — I712 Thoracic aortic aneurysm, without rupture, unspecified: Secondary | ICD-10-CM

## 2020-12-23 DIAGNOSIS — R69 Illness, unspecified: Secondary | ICD-10-CM | POA: Diagnosis not present

## 2020-12-23 DIAGNOSIS — F410 Panic disorder [episodic paroxysmal anxiety] without agoraphobia: Secondary | ICD-10-CM | POA: Diagnosis not present

## 2020-12-26 ENCOUNTER — Other Ambulatory Visit: Payer: Self-pay

## 2020-12-26 ENCOUNTER — Ambulatory Visit
Admission: RE | Admit: 2020-12-26 | Discharge: 2020-12-26 | Disposition: A | Discharge status: Home / Self Care | Payer: Medicare HMO | Source: Ambulatory Visit | Attending: Thoracic Surgery (Cardiothoracic Vascular Surgery) | Admitting: Thoracic Surgery (Cardiothoracic Vascular Surgery)

## 2020-12-26 DIAGNOSIS — I712 Thoracic aortic aneurysm, without rupture, unspecified: Secondary | ICD-10-CM

## 2020-12-26 DIAGNOSIS — I2510 Atherosclerotic heart disease of native coronary artery without angina pectoris: Secondary | ICD-10-CM | POA: Diagnosis not present

## 2020-12-26 DIAGNOSIS — I700 Atherosclerosis of aorta: Secondary | ICD-10-CM | POA: Diagnosis not present

## 2020-12-26 MED ORDER — IOPAMIDOL (ISOVUE-370) INJECTION 76%
75.0000 mL | Freq: Once | INTRAVENOUS | Status: AC | PRN
  Administered 2020-12-26: 75 mL via INTRAVENOUS

## 2020-12-31 ENCOUNTER — Ambulatory Visit: Payer: Medicare HMO | Admitting: Thoracic Surgery (Cardiothoracic Vascular Surgery)

## 2020-12-31 ENCOUNTER — Other Ambulatory Visit: Payer: Self-pay

## 2020-12-31 ENCOUNTER — Encounter: Payer: Self-pay | Admitting: Thoracic Surgery (Cardiothoracic Vascular Surgery)

## 2020-12-31 VITALS — BP 123/70 | HR 92 | Resp 20 | Ht 70.0 in | Wt 159.0 lb

## 2020-12-31 DIAGNOSIS — I712 Thoracic aortic aneurysm, without rupture, unspecified: Secondary | ICD-10-CM

## 2020-12-31 NOTE — Progress Notes (Signed)
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  Eugene Guzman is a 65 year old man with a history of anxiety, depression, coronary calcification on CT, dyslipidemia, and borderline hypertension.  He does have a family history of CAD as well.  He had a CT for coronary calcium score done in 2021.  He had a lot of calcium in his coronaries but a stress test showed no evidence of ischemia.  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  His primary complaint is fatigue and lack of energy.  He says his blood pressure is usually around 140 in the mornings.  He is not having any chest pain, pressure, or tightness.   Past Medical History:  Diagnosis Date   Anxiety    Coronary artery calcification seen on CT scan    Depressed    Hypertension      Current Outpatient Medications  Medication Sig Dispense Refill   ALPRAZolam (XANAX) 1 MG tablet Take 1 mg by mouth daily as needed.     busPIRone (BUSPAR) 5 MG tablet Take 5 mg by mouth 2 (two) times daily.     citalopram (CELEXA) 20 MG tablet Take by mouth.     metoprolol succinate (TOPROL XL) 25 MG 24 hr tablet Take 1 tablet (25 mg total) by mouth daily. 30 tablet 5   rosuvastatin (CRESTOR) 20 MG tablet Take 1 tablet (20 mg total) by mouth daily. 90 tablet 3   No current facility-administered medications for this visit.    Physical Exam BP 123/70   Pulse 92   Resp 20   Ht 5\' 10"  (1.778 m)   Wt 159 lb (72.1 kg)   SpO2 96% Comment: RA  BMI 22.62 kg/m  65 year old man in no acute distress Alert and oriented x3 with no focal deficits No carotid bruits Cardiac regular rate and rhythm with normal S1 and S2, no murmur Lungs clear Pulses intact  Diagnostic Tests: CT ANGIOGRAPHY CHEST WITH CONTRAST   TECHNIQUE: Multidetector CT imaging of the chest was performed using the standard protocol during  bolus administration of intravenous contrast. Multiplanar CT image reconstructions and MIPs were obtained to evaluate the vascular anatomy.   CONTRAST:  63mL ISOVUE-370 IOPAMIDOL (ISOVUE-370) INJECTION 76%   COMPARISON:  06/24/2020   FINDINGS: Cardiovascular:   Heart:   No cardiomegaly. No pericardial fluid/thickening. Calcifications of left main, left anterior descending, circumflex, right coronary arteries.   Aorta:   Minimal aortic valve calcifications.   Aortic annulus best estimated on the coronal reformatted images, 28 mm.   Coronal images demonstrate best estimate of the sino-tubular junction, 39 mm.   Axial images estimate of the ascending aorta 44 mm.   Mild atherosclerosis of the aortic arch and descending thoracic aorta. No dissection. No pedunculated or ulcerated plaque. Three vessel arch. Type 3 arch. Branch vessels are patent. Cervical cerebral vessels patent at the base of the neck.   There is irregular plaque/ulcerated plaque at the proximal left subclavian artery, just after the origin, with extension approximately 1 mm-2 mm beyond the lumen. This was present on the comparison CT.   Pulmonary arteries:   Timing of the contrast bolus is not optimized for evaluation of pulmonary artery filling defects. No filling defects of the left atrium identified  Mediastinum/Nodes: No mediastinal adenopathy. Unremarkable appearance of the thoracic esophagus.   Unremarkable appearance of the thoracic inlet.   Lungs/Pleura: Central airways are clear. No pleural effusion. No confluent airspace disease.   No pneumothorax.   Upper Abdomen: No acute.   Musculoskeletal: No acute displaced fracture. Degenerative changes of the spine. Previously identified hyperenhancing lesion in the right liver is not as well visualized on today's study, with faint enhancement in this region. Overall characteristics are compatible with benign vascular lesion.   Review of  the MIP images confirms the above findings.   IMPRESSION: Relatively unchanged size and configuration of the ascending aorta. Greatest estimated diameter on the current CT 4.3 cm. Associated atherosclerosis. Aortic Atherosclerosis (ICD10-I70.0). Aortic aneurysm NOS (ICD10-I71.9)   Coronary artery disease.   Signed,   Eugene Fanny. Dellia Guzman, RPVI   [facility]   [facility]     Electronically Signed   By: Eugene Guzman D.O.   On: 12/26/2020 13:20 I personally reviewed the CT images and concur with the findings noted above  Impression: Eugene Guzman is a 65 year old man with a history of anxiety, depression, hypertension, coronary calcification on CT, and aortic root and ascending aorta aneurysm.  Ascending aneurysm-stable.  Largest in the aortic root is would be expected.  About 5 cm there.  4.3 cm in mid ascending.  Hypertension-blood pressure within normal limits at his visit today.  He says it often runs around 140 in the mornings.  He has been checking himself in any other time of the day.  Depression-he says he feels a lack of energy and is tired all the time.  He and his wife wonder if the Toprol or his statin could be contributing.  While that is possible I suspect this is mostly depression but recommend they check with Eugene Guzman to see if she feels like any change in his medications he was going to.  Plan: Return in 6 months with CT angio  Eugene Nakayama, MD [facility] [phone]

## 2021-01-01 ENCOUNTER — Other Ambulatory Visit: Payer: Medicare HMO | Admitting: *Deleted

## 2021-01-01 DIAGNOSIS — E785 Hyperlipidemia, unspecified: Secondary | ICD-10-CM | POA: Diagnosis not present

## 2021-01-01 DIAGNOSIS — R972 Elevated prostate specific antigen [PSA]: Secondary | ICD-10-CM | POA: Diagnosis not present

## 2021-01-02 NOTE — Progress Notes (Signed)
Reviewed meds and scans with pt  He complains of fatigue   No CP   he has a long hx of depression    Has been switched off of Zoloft to Celexa  Not sure if cuasing  Impress: I do not think fatigue is due to Crestor   keep on thi Could it be do to Topprol   Could be though low dose   Could cut in 1/2 for 1 wk then off.   Please call in amlodipine 2.5 mg  He can take 1/2 Changing meds for depression can make it difficult to discern what the problem is     If continues to have and/ or gets worse consider further evaluation for ischemia   At some pt may need cath

## 2021-01-03 ENCOUNTER — Telehealth: Payer: Self-pay | Admitting: *Deleted

## 2021-01-03 LAB — NMR, LIPOPROFILE
Cholesterol, Total: 190 mg/dL (ref 100–199)
HDL Particle Number: 49.2 umol/L (ref >=30.5)
HDL-C: 79 mg/dL (ref >39)
LDL Particle Number: 869 nmol/L (ref <1000)
LDL Size: 21 nm (ref >20.5)
LDL-C (NIH Calc): 85 mg/dL (ref 0–99)
LP-IR Score: 56 — ABNORMAL HIGH (ref <=45)
Small LDL Particle Number: 372 nmol/L (ref <=527)
Triglycerides: 158 mg/dL — ABNORMAL HIGH (ref 0–149)

## 2021-01-03 LAB — LIPOPROTEIN A (LPA): Lipoprotein (a): <8.4 nmol/L (ref <75.0)

## 2021-01-03 LAB — APOLIPOPROTEIN B: Apolipoprotein B: 71 mg/dL (ref <90)

## 2021-01-03 NOTE — Telephone Encounter (Deleted)
-----   Message from Fay Records, MD sent at 01/02/2021  9:20 PM EDT -----    ----- Message ----- From: Melrose Nakayama, MD Sent: 12/31/2020  10:05 AM EDT To: Fay Records, MD

## 2021-01-03 NOTE — Telephone Encounter (Signed)
Pt aware of recommendations and will try decreasing Toprol in the meantime will get clarification on Amlodipine  Is pt to take Amlodipine 2.5 mg  1/2 tab daily ? Will forward to Dr Harrington Challenger .Adonis Housekeeper

## 2021-01-03 NOTE — Telephone Encounter (Signed)
He complains of fatigue   No CP   he has a long hx of depression    Has been switched off of Zoloft to Celexa  Not sure if cuasing   Impress: I do not think fatigue is due to Crestor   keep on thi Could it be do to Topprol   Could be though low dose   Could cut in 1/2 for 1 wk then off.   Please call in amlodipine 2.5 mg  He can take 1/2 Changing meds for depression can make it difficult to discern what the problem is     If continues to have and/ or gets worse consider further evaluation for ischemia   At some pt may need cath

## 2021-01-03 NOTE — Telephone Encounter (Signed)
-----   Message from Fay Records, MD sent at 01/02/2021  9:20 PM EDT -----    ----- Message ----- From: Melrose Nakayama, MD Sent: 12/31/2020  10:05 AM EDT To: Fay Records, MD

## 2021-01-07 NOTE — Telephone Encounter (Signed)
Pt just had lipids drawn IN regards to amlodipine I would recomm  1/2 of 2.5 mg    Folllow BP  (runs higher at home)

## 2021-01-08 MED ORDER — AMLODIPINE BESYLATE 2.5 MG PO TABS: 1.2500 mg | ORAL_TABLET | Freq: Every day | ORAL | 3 refills | Status: DC

## 2021-01-08 NOTE — Telephone Encounter (Signed)
Left a message for the pt to call back.  

## 2021-01-08 NOTE — Telephone Encounter (Signed)
Pt aware to start amlodipine 1/2 of 2.5 mg tablet one week after weaning off Toprol XL

## 2021-01-09 ENCOUNTER — Telehealth: Payer: Self-pay

## 2021-01-09 DIAGNOSIS — E785 Hyperlipidemia, unspecified: Secondary | ICD-10-CM

## 2021-01-09 DIAGNOSIS — R972 Elevated prostate specific antigen [PSA]: Secondary | ICD-10-CM | POA: Diagnosis not present

## 2021-01-09 DIAGNOSIS — R3915 Urgency of urination: Secondary | ICD-10-CM | POA: Diagnosis not present

## 2021-01-09 MED ORDER — EZETIMIBE 10 MG PO TABS: 10.0000 mg | ORAL_TABLET | Freq: Every day | ORAL | 3 refills | Status: DC

## 2021-01-09 NOTE — Telephone Encounter (Signed)
-----   Message from Fay Records, MD sent at 01/07/2021  4:11 PM EDT ----- LDL is still higher than it should be   Goal is less than 70 Some people are hyper absorbers of fats   I would add Zetia 10 mg   F/U lipids in 8 wks(lipomed panel) Also-  Triglycerids are high   158   Diet dependent   Much better in past    Watch carbs/sweets

## 2021-01-09 NOTE — Telephone Encounter (Signed)
The patient has been notified of the result and verbalized understanding.  All questions (if any) were answered. Antonieta Iba, RN 01/09/2021 2:13 PM  Rx for zetia has been sent in. Lab work has been scheduled.

## 2021-01-10 DIAGNOSIS — R69 Illness, unspecified: Secondary | ICD-10-CM | POA: Diagnosis not present

## 2021-01-10 DIAGNOSIS — F410 Panic disorder [episodic paroxysmal anxiety] without agoraphobia: Secondary | ICD-10-CM | POA: Diagnosis not present

## 2021-01-30 ENCOUNTER — Other Ambulatory Visit: Payer: Self-pay

## 2021-01-30 NOTE — Telephone Encounter (Signed)
Contacted patient   Reviewed meds with pharmacy   OK to take  Wife said patinet has had chronic diarrhea issues      Recomm  that he could try stopping Zetia for a bit and see if it improves   If no change resume   If improves call/email

## 2021-01-31 ENCOUNTER — Ambulatory Visit (INDEPENDENT_AMBULATORY_CARE_PROVIDER_SITE_OTHER): Payer: Medicare HMO | Admitting: Internal Medicine

## 2021-01-31 ENCOUNTER — Encounter: Payer: Self-pay | Admitting: Internal Medicine

## 2021-01-31 DIAGNOSIS — I2510 Atherosclerotic heart disease of native coronary artery without angina pectoris: Secondary | ICD-10-CM | POA: Diagnosis not present

## 2021-01-31 DIAGNOSIS — N400 Enlarged prostate without lower urinary tract symptoms: Secondary | ICD-10-CM | POA: Insufficient documentation

## 2021-01-31 DIAGNOSIS — I7121 Aneurysm of the ascending aorta, without rupture: Secondary | ICD-10-CM | POA: Insufficient documentation

## 2021-01-31 DIAGNOSIS — I10 Essential (primary) hypertension: Secondary | ICD-10-CM

## 2021-01-31 DIAGNOSIS — F339 Major depressive disorder, recurrent, unspecified: Secondary | ICD-10-CM | POA: Insufficient documentation

## 2021-01-31 DIAGNOSIS — N401 Enlarged prostate with lower urinary tract symptoms: Secondary | ICD-10-CM

## 2021-01-31 DIAGNOSIS — R351 Nocturia: Secondary | ICD-10-CM

## 2021-01-31 DIAGNOSIS — I712 Thoracic aortic aneurysm, without rupture: Secondary | ICD-10-CM | POA: Diagnosis not present

## 2021-01-31 DIAGNOSIS — E785 Hyperlipidemia, unspecified: Secondary | ICD-10-CM | POA: Insufficient documentation

## 2021-01-31 DIAGNOSIS — R69 Illness, unspecified: Secondary | ICD-10-CM | POA: Diagnosis not present

## 2021-01-31 DIAGNOSIS — E782 Mixed hyperlipidemia: Secondary | ICD-10-CM | POA: Diagnosis not present

## 2021-01-31 NOTE — Progress Notes (Signed)
New Patient Office Visit     This visit occurred during the SARS-CoV-2 public health emergency.  Safety protocols were in place, including screening questions prior to the visit, additional usage of staff PPE, and extensive cleaning of exam room while observing appropriate contact time as indicated for disinfecting solutions.    CC/Reason for Visit: Establish care, discuss chronic conditions Previous PCP: Unknown Last Visit: Recently  HPI: Eugene Guzman is a 65 y.o. male who is coming in today for the above mentioned reasons. Past Medical History is significant for: Hypertension, hyperlipidemia, BPH, coronary artery disease diagnosed after a coronary CT scan placed him in the 97th percentile, he also has an ascending thoracic aortic aneurysm that was measured at 4.3 cm in June.  He also has a history of depression/anxiety, unclear if he has a diagnosis of bipolar disorder.  He is followed by a psychiatrist by the name of Noemi Chapel.  There have been some recent medication changes as his mood does not seem stable.  He has tried Zoloft, Celexa that has been uptitrated several times and most recently was placed on Depakote which he decided not to take after the pharmacist told him that this was a seizure medication.  He would like my opinion on whether this is a wise choice.  He recently started seeing Dr. Tresa Moore with urology, he is having a lot of nocturia.  Prostate biopsy was not recommended.  Most recent PSA was around 4.3.  They plan to follow his PSA yearly.  His cardiologist is Dr. Harrington Challenger, his CT surgeon is Dr. Roxan Hockey.   Past Medical/Surgical History: Past Medical History:  Diagnosis Date   Anxiety    Coronary artery calcification seen on CT scan    Depressed    Hypertension     Past Surgical History:  Procedure Laterality Date   TONSILLECTOMY  1961    Social History:  reports that he has quit smoking. His smoking use included cigars. He has never used smokeless tobacco.  He reports current alcohol use. He reports that he does not use drugs.  Allergies: Allergies  Allergen Reactions   Penicillins     Family History:  Family History  Problem Relation Age of Onset   Hypertension Mother    Heart attack Father      Current Outpatient Medications:    ALPRAZolam (XANAX) 1 MG tablet, Take 1 mg by mouth daily as needed., Disp: , Rfl:    amLODipine (NORVASC) 2.5 MG tablet, Take 0.5 tablets (1.25 mg total) by mouth daily., Disp: 45 tablet, Rfl: 3   citalopram (CELEXA) 20 MG tablet, Take by mouth., Disp: , Rfl:    ezetimibe (ZETIA) 10 MG tablet, Take 1 tablet (10 mg total) by mouth daily., Disp: 90 tablet, Rfl: 3   finasteride (PROSCAR) 5 MG tablet, Take 5 mg by mouth daily., Disp: , Rfl:    rosuvastatin (CRESTOR) 20 MG tablet, Take 1 tablet (20 mg total) by mouth daily., Disp: 90 tablet, Rfl: 3   divalproex (DEPAKOTE ER) 500 MG 24 hr tablet, Take by mouth. (Patient not taking: Reported on 01/31/2021), Disp: , Rfl:   Review of Systems:  Constitutional: Denies fever, chills, diaphoresis, appetite change and fatigue.  HEENT: Denies photophobia, eye pain, redness, hearing loss, ear pain, congestion, sore throat, rhinorrhea, sneezing, mouth sores, trouble swallowing, neck pain, neck stiffness and tinnitus.   Respiratory: Denies SOB, DOE, cough, chest tightness,  and wheezing.   Cardiovascular: Denies chest pain, palpitations and leg swelling.  Gastrointestinal: Denies nausea, vomiting, abdominal pain, diarrhea, constipation, blood in stool and abdominal distention.  Genitourinary: Denies dysuria, urgency, frequency, hematuria, flank pain and difficulty urinating.  Endocrine: Denies: hot or cold intolerance, sweats, changes in hair or nails, polyuria, polydipsia. Musculoskeletal: Denies myalgias, back pain, joint swelling, arthralgias and gait problem.  Skin: Denies pallor, rash and wound.  Neurological: Denies dizziness, seizures, syncope, weakness,  light-headedness, numbness and headaches.  Hematological: Denies adenopathy. Easy bruising, personal or family bleeding history  Psychiatric/Behavioral: Denies suicidal ideation, mood changes, confusion, nervousness, sleep disturbance and agitation    Physical Exam: Vitals:   01/31/21 1311  BP: 130/80  Pulse: 64  Temp: 98.4 F (36.9 C)  TempSrc: Oral  SpO2: 97%  Weight: 160 lb 11.2 oz (72.9 kg)  Height: '5\' 10"'$  (1.778 m)   Body mass index is 23.06 kg/m.  Constitutional: NAD, calm, comfortable Eyes: PERRL, lids and conjunctivae normal, wears corrective lenses ENMT: Mucous membranes are moist.  Respiratory: clear to auscultation bilaterally, no wheezing, no crackles. Normal respiratory effort. No accessory muscle use.  Cardiovascular: Regular rate and rhythm, no murmurs / rubs / gallops. No extremity edema.  Neurologic: Grossly intact and nonfocal   Impression and Plan:  Primary hypertension -Blood pressure is currently well controlled on amlodipine.  Benign prostatic hyperplasia with nocturia -With elevated PSA, followed by urology on finasteride.  Depression, recurrent ([place]) -I think that being on Depakote as a mood stabilizer is probably a good idea.  I am not clear as to what his mental health diagnosis is exactly.  From what he tells me there are definitely some depressive features but also some strong anxiety features. -I have advised that he continue to follow-up with his psychiatrist.  Mixed hyperlipidemia -Most recent lipid panel in June 2022 with a total cholesterol of 190, triglycerides 158 and LDL 85. -He is currently on rosuvastatin 20 mg daily and ezetimibe 10 mg daily. -Recheck lipids in around 3 months.  Coronary artery disease involving native coronary artery of native heart without angina pectoris -He has no pain, shortness of breath with exertion.  He sees Dr. Harrington Challenger.  He is currently on rosuvastatin, ezetimibe and amlodipine.  Ascending aortic aneurysm  (HCC) -Followed every 6 months by CT surgery, his most recent measurement was 4.3 cm in June.    Time spent: 47 minutes reviewing chart, interviewing and examining patient and formulating plan of care.    Lelon Frohlich, MD [facility]

## 2021-03-04 ENCOUNTER — Other Ambulatory Visit: Payer: Self-pay

## 2021-03-04 ENCOUNTER — Other Ambulatory Visit: Payer: Medicare HMO | Admitting: *Deleted

## 2021-03-04 DIAGNOSIS — E785 Hyperlipidemia, unspecified: Secondary | ICD-10-CM

## 2021-03-04 LAB — LIPID PANEL
Chol/HDL Ratio: 1.7 ratio (ref 0.0–5.0)
Cholesterol, Total: 160 mg/dL (ref 100–199)
HDL: 92 mg/dL (ref >39)
LDL Chol Calc (NIH): 54 mg/dL (ref 0–99)
Triglycerides: 77 mg/dL (ref 0–149)
VLDL Cholesterol Cal: 14 mg/dL (ref 5–40)

## 2021-03-05 DIAGNOSIS — F410 Panic disorder [episodic paroxysmal anxiety] without agoraphobia: Secondary | ICD-10-CM | POA: Diagnosis not present

## 2021-03-05 DIAGNOSIS — R69 Illness, unspecified: Secondary | ICD-10-CM | POA: Diagnosis not present

## 2021-03-07 ENCOUNTER — Other Ambulatory Visit: Payer: Medicare HMO

## 2021-04-03 DIAGNOSIS — R69 Illness, unspecified: Secondary | ICD-10-CM | POA: Diagnosis not present

## 2021-04-03 DIAGNOSIS — F332 Major depressive disorder, recurrent severe without psychotic features: Secondary | ICD-10-CM | POA: Diagnosis not present

## 2021-04-03 DIAGNOSIS — F410 Panic disorder [episodic paroxysmal anxiety] without agoraphobia: Secondary | ICD-10-CM | POA: Diagnosis not present

## 2021-04-08 ENCOUNTER — Encounter: Payer: Self-pay | Admitting: Internal Medicine

## 2021-05-05 ENCOUNTER — Other Ambulatory Visit: Payer: Self-pay

## 2021-05-06 ENCOUNTER — Ambulatory Visit (INDEPENDENT_AMBULATORY_CARE_PROVIDER_SITE_OTHER): Payer: Medicare HMO | Admitting: Internal Medicine

## 2021-05-06 ENCOUNTER — Encounter: Payer: Self-pay | Admitting: Internal Medicine

## 2021-05-06 VITALS — BP 110/80 | HR 62 | Temp 98.2°F | Ht 70.25 in | Wt 160.0 lb

## 2021-05-06 DIAGNOSIS — E782 Mixed hyperlipidemia: Secondary | ICD-10-CM | POA: Diagnosis not present

## 2021-05-06 DIAGNOSIS — F339 Major depressive disorder, recurrent, unspecified: Secondary | ICD-10-CM

## 2021-05-06 DIAGNOSIS — I2510 Atherosclerotic heart disease of native coronary artery without angina pectoris: Secondary | ICD-10-CM | POA: Diagnosis not present

## 2021-05-06 DIAGNOSIS — Z0000 Encounter for general adult medical examination without abnormal findings: Secondary | ICD-10-CM | POA: Diagnosis not present

## 2021-05-06 DIAGNOSIS — I10 Essential (primary) hypertension: Secondary | ICD-10-CM

## 2021-05-06 DIAGNOSIS — Z23 Encounter for immunization: Secondary | ICD-10-CM

## 2021-05-06 DIAGNOSIS — I7121 Aneurysm of the ascending aorta, without rupture: Secondary | ICD-10-CM | POA: Diagnosis not present

## 2021-05-06 DIAGNOSIS — G4733 Obstructive sleep apnea (adult) (pediatric): Secondary | ICD-10-CM

## 2021-05-06 DIAGNOSIS — R69 Illness, unspecified: Secondary | ICD-10-CM | POA: Diagnosis not present

## 2021-05-06 LAB — COMPREHENSIVE METABOLIC PANEL
ALT: 25 U/L (ref 0–53)
AST: 27 U/L (ref 0–37)
Albumin: 4.6 g/dL (ref 3.5–5.2)
Alkaline Phosphatase: 67 U/L (ref 39–117)
BUN: 16 mg/dL (ref 6–23)
CO2: 28 mEq/L (ref 19–32)
Calcium: 9.5 mg/dL (ref 8.4–10.5)
Chloride: 102 mEq/L (ref 96–112)
Creatinine, Ser: 1 mg/dL (ref 0.40–1.50)
GFR: 77.88 mL/min (ref >60.00)
Glucose, Bld: 94 mg/dL (ref 70–99)
Potassium: 4.7 mEq/L (ref 3.5–5.1)
Sodium: 139 mEq/L (ref 135–145)
Total Bilirubin: 0.7 mg/dL (ref 0.2–1.2)
Total Protein: 7.2 g/dL (ref 6.0–8.3)

## 2021-05-06 LAB — VITAMIN D 25 HYDROXY (VIT D DEFICIENCY, FRACTURES): VITD: 28.44 ng/mL — ABNORMAL LOW (ref 30.00–100.00)

## 2021-05-06 LAB — LIPID PANEL
Cholesterol: 166 mg/dL (ref 0–200)
HDL: 83.4 mg/dL (ref >39.00)
LDL Cholesterol: 72 mg/dL (ref 0–99)
NonHDL: 82.3
Total CHOL/HDL Ratio: 2
Triglycerides: 52 mg/dL (ref 0.0–149.0)
VLDL: 10.4 mg/dL (ref 0.0–40.0)

## 2021-05-06 LAB — CBC WITH DIFFERENTIAL/PLATELET
Basophils Absolute: 0.1 10*3/uL (ref 0.0–0.1)
Basophils Relative: 0.7 % (ref 0.0–3.0)
Eosinophils Absolute: 0.2 10*3/uL (ref 0.0–0.7)
Eosinophils Relative: 2.6 % (ref 0.0–5.0)
HCT: 44.5 % (ref 39.0–52.0)
Hemoglobin: 15 g/dL (ref 13.0–17.0)
Lymphocytes Relative: 48.9 % — ABNORMAL HIGH (ref 12.0–46.0)
Lymphs Abs: 3.9 10*3/uL (ref 0.7–4.0)
MCHC: 33.8 g/dL (ref 30.0–36.0)
MCV: 95.4 fl (ref 78.0–100.0)
Monocytes Absolute: 0.7 10*3/uL (ref 0.1–1.0)
Monocytes Relative: 9.1 % (ref 3.0–12.0)
Neutro Abs: 3.1 10*3/uL (ref 1.4–7.7)
Neutrophils Relative %: 38.7 % — ABNORMAL LOW (ref 43.0–77.0)
Platelets: 150 10*3/uL (ref 150.0–400.0)
RBC: 4.66 Mil/uL (ref 4.22–5.81)
RDW: 12.8 % (ref 11.5–15.5)
WBC: 7.9 10*3/uL (ref 4.0–10.5)

## 2021-05-06 LAB — TSH: TSH: 3.82 u[IU]/mL (ref 0.35–5.50)

## 2021-05-06 LAB — VITAMIN B12: Vitamin B-12: 275 pg/mL (ref 211–911)

## 2021-05-06 LAB — HEMOGLOBIN A1C: Hgb A1c MFr Bld: 5.7 % (ref 4.6–6.5)

## 2021-05-06 NOTE — Addendum Note (Signed)
Addended by: Westley Hummer B on: 05/06/2021 02:19 PM   Modules accepted: Orders

## 2021-05-06 NOTE — Progress Notes (Signed)
Established Patient Office Visit     This visit occurred during the SARS-CoV-2 public health emergency.  Safety protocols were in place, including screening questions prior to the visit, additional usage of staff PPE, and extensive cleaning of exam room while observing appropriate contact time as indicated for disinfecting solutions.    CC/Reason for Visit: Annual preventive exam and subsequent Medicare wellness visit  HPI: Eugene Guzman is a 65 y.o. male who is coming in today for the above mentioned reasons. Past Medical History is significant for: Hypertension, hyperlipidemia, BPH, coronary artery disease followed by Dr. Harrington Challenger, and ascending thoracic aortic aneurysm for which she gets CT scans every 6 months last in June at which time it measured 4.3 cm.  His cardiologist is Dr. Harrington Challenger and his CT surgeon is Dr. Roxan Hockey.  He also has a severe mood disorder with depression and anxiety.  He is actually scheduled to see his psychiatrist this week.  He is overdue for pneumonia, second shingles and Tdap.  He has recently had his flu vaccine and his COVID booster.  He had a colonoscopy 3 years ago with Dr. Earlean Shawl and is a 5-year follow-up.  He has routine dental care, is overdue for an eye exam.  His wife has been describing frequent episodes of snoring, apnea at nighttime and shallow breathing.   Past Medical/Surgical History: Past Medical History:  Diagnosis Date   Anxiety    Coronary artery calcification seen on CT scan    Depressed    Hypertension     Past Surgical History:  Procedure Laterality Date   TONSILLECTOMY  1961    Social History:  reports that he has quit smoking. His smoking use included cigars. He has never used smokeless tobacco. He reports current alcohol use. He reports that he does not use drugs.  Allergies: Allergies  Allergen Reactions   Penicillins     Family History:  Family History  Problem Relation Age of Onset   Hypertension Mother    Heart  attack Father      Current Outpatient Medications:    ALPRAZolam (XANAX) 1 MG tablet, Take 1 mg by mouth daily as needed., Disp: , Rfl:    amLODipine (NORVASC) 2.5 MG tablet, Take 0.5 tablets (1.25 mg total) by mouth daily., Disp: 45 tablet, Rfl: 3   clonazePAM (KLONOPIN) 0.5 MG tablet, Take 0.5 mg by mouth 3 (three) times daily as needed for anxiety., Disp: , Rfl:    Desvenlafaxine ER 100 MG TB24, , Disp: , Rfl:    ezetimibe (ZETIA) 10 MG tablet, Take 1 tablet (10 mg total) by mouth daily., Disp: 90 tablet, Rfl: 3   finasteride (PROSCAR) 5 MG tablet, Take 5 mg by mouth daily., Disp: , Rfl:    rosuvastatin (CRESTOR) 20 MG tablet, Take 1 tablet (20 mg total) by mouth daily., Disp: 90 tablet, Rfl: 3  Review of Systems:  Constitutional: Denies fever, chills, diaphoresis, appetite change and fatigue.  HEENT: Denies photophobia, eye pain, redness, hearing loss, ear pain, congestion, sore throat, rhinorrhea, sneezing, mouth sores, trouble swallowing, neck pain, neck stiffness and tinnitus.   Respiratory: Denies SOB, DOE, cough, chest tightness,  and wheezing.   Cardiovascular: Denies chest pain, palpitations and leg swelling.  Gastrointestinal: Denies nausea, vomiting, abdominal pain, diarrhea, constipation, blood in stool and abdominal distention.  Genitourinary: Denies dysuria, urgency, frequency, hematuria, flank pain and difficulty urinating.  Endocrine: Denies: hot or cold intolerance, sweats, changes in hair or nails, polyuria, polydipsia. Musculoskeletal: Denies myalgias,  back pain, joint swelling, arthralgias and gait problem.  Skin: Denies pallor, rash and wound.  Neurological: Denies dizziness, seizures, syncope, weakness, light-headedness, numbness and headaches.  Hematological: Denies adenopathy. Easy bruising, personal or family bleeding history  Psychiatric/Behavioral: Denies suicidal ideation,  confusion, nervousness and agitation    Physical Exam: Vitals:   05/06/21 1044   BP: 110/80  Pulse: 62  Temp: 98.2 F (36.8 C)  TempSrc: Oral  SpO2: 96%  Weight: 160 lb (72.6 kg)  Height: 5' 10.25" (1.784 m)    Body mass index is 22.79 kg/m.   Constitutional: NAD, calm, comfortable Eyes: PERRL, lids and conjunctivae normal ENMT: Mucous membranes are moist. Posterior pharynx clear of any exudate or lesions. Normal dentition. Tympanic membrane is pearly white, no erythema or bulging. Neck: normal, supple, no masses, no thyromegaly Respiratory: clear to auscultation bilaterally, no wheezing, no crackles. Normal respiratory effort. No accessory muscle use.  Cardiovascular: Regular rate and rhythm, no murmurs / rubs / gallops. No extremity edema. 2+ pedal pulses. No carotid bruits.  Abdomen: no tenderness, no masses palpated. No hepatosplenomegaly. Bowel sounds positive.  Musculoskeletal: no clubbing / cyanosis. No joint deformity upper and lower extremities. Good ROM, no contractures. Normal muscle tone.  Skin: no rashes, lesions, ulcers. No induration Neurologic: CN 2-12 grossly intact. Sensation intact, DTR normal. Strength 5/5 in all 4.  Psychiatric: Normal judgment and insight. Alert and oriented x 3. Normal mood.    Subsequent Medicare wellness visit   1. Risk factors, based on past  M,S,F -cardiovascular disease risk factors include age, gender, history of hyperlipidemia, hypertension, known CAD   2.  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  Depression/mood: Severe depression followed by psychiatry   4.  Hearing: Mild to moderate issues, declines audiology referral   5.  ADL's: Independent in all ADLs   6.  Fall risk: Low fall risk   7.  Home safety: No problems identified   8.  Height weight, and visual acuity: height and weight as above, vision:  Vision Screening   Right eye Left eye Both eyes  Without correction 20/20 20/25 20/20   With correction        9.  Counseling: Advised he update his vaccination  status   10. Lab orders based on risk factors: Laboratory update will be reviewed   11. Referral : None today   12. Care plan: Follow-up with me in 6 months   13. Cognitive assessment: No cognitive impairment   14. Screening: Patient provided with a written and personalized 5-10 year screening schedule in the AVS. yes   15. Provider List Update: PCP, cardiologist Dr. Harrington Challenger, psychiatrist Noemi Chapel  16. Advance Directives: Full code   17. Opioids: Patient is not on any opioid prescriptions and has no risk factors for a substance use disorder.   [place] Office Visit from 01/31/2021 in [place] at [facility]  PHQ-9 Total Score 19       Fall Risk 01/31/2021 05/06/2021  Falls in the past year? 0 0  Was there an injury with Fall? 0 0  Fall Risk Category Calculator 0 0  Fall Risk Category Low Low      Impression and Plan:  Encounter for preventive health examination -Recommend routine eye and dental care. -Immunizations: PCV 20 today, he will get Tdap and second shingles at pharmacy, otherwise up-to-date -Healthy lifestyle discussed in detail. -Labs to be updated today. -Colon cancer screening: Up-to-date, he will get most  recent colonoscopy to [place] -Breast cancer screening: Not applicable -Cervical cancer screening: Not applicable -Lung cancer screening: Not applicable -Prostate cancer screening: PSA was 4.390 in June, follows with urology -DEXA: Not applicable   Need for vaccination against Streptococcus pneumoniae -PCV 20 today.  Primary hypertension  - Plan: CBC with Differential/Platelet, Comprehensive metabolic panel -Well-controlled.  Mixed hyperlipidemia  - Plan: Lipid panel -Currently on ezetimibe and rosuvastatin.  Coronary artery disease involving native coronary artery of native heart without angina pectoris -Followed by cardiology.  Aneurysm of ascending aorta without rupture  -Due for repeat CT at the end of the year.  Depression,  recurrent ([place])  - Plan: TSH, Vitamin B12, VITAMIN D 25 Hydroxy (Vit-D Deficiency, Fractures) -Mood is not stable, follow-up with psychiatry.  OSA (obstructive sleep apnea)  - Plan: Ambulatory referral to Neurology for sleep medicine, symptoms that the wife is describing are highly suspicious for OSA.   Patient Instructions  -Nice seeing you today!!  -Lab work today; will notify you once results are available.  -Pneumonia vaccine today. Remember second shingles and tetanus vaccines at your pharmacy.  -Sleep study will be ordered.    Lelon Frohlich, MD [facility]

## 2021-05-06 NOTE — Patient Instructions (Signed)
-  Nice seeing you today!!  -Lab work today; will notify you once results are available.  -Pneumonia vaccine today. Remember second shingles and tetanus vaccines at your pharmacy.  -Sleep study will be ordered.

## 2021-05-06 NOTE — Addendum Note (Signed)
Addended by: Amanda Cockayne on: 05/06/2021 11:36 AM   Modules accepted: Orders

## 2021-05-07 ENCOUNTER — Other Ambulatory Visit: Payer: Self-pay | Admitting: Internal Medicine

## 2021-05-07 ENCOUNTER — Encounter: Payer: Self-pay | Admitting: Internal Medicine

## 2021-05-07 DIAGNOSIS — F410 Panic disorder [episodic paroxysmal anxiety] without agoraphobia: Secondary | ICD-10-CM | POA: Diagnosis not present

## 2021-05-07 DIAGNOSIS — E559 Vitamin D deficiency, unspecified: Secondary | ICD-10-CM | POA: Insufficient documentation

## 2021-05-07 DIAGNOSIS — F332 Major depressive disorder, recurrent severe without psychotic features: Secondary | ICD-10-CM | POA: Diagnosis not present

## 2021-05-07 DIAGNOSIS — R69 Illness, unspecified: Secondary | ICD-10-CM | POA: Diagnosis not present

## 2021-05-07 MED ORDER — VITAMIN D (ERGOCALCIFEROL) 1.25 MG (50000 UNIT) PO CAPS: 50000.0000 [IU] | ORAL_CAPSULE | ORAL | 0 refills | Status: DC

## 2021-05-09 ENCOUNTER — Other Ambulatory Visit: Payer: Self-pay | Admitting: Internal Medicine

## 2021-05-09 DIAGNOSIS — E559 Vitamin D deficiency, unspecified: Secondary | ICD-10-CM

## 2021-05-20 ENCOUNTER — Other Ambulatory Visit: Payer: Self-pay | Admitting: *Deleted

## 2021-05-20 DIAGNOSIS — I712 Thoracic aortic aneurysm, without rupture, unspecified: Secondary | ICD-10-CM

## 2021-05-28 ENCOUNTER — Ambulatory Visit: Payer: Medicare HMO

## 2021-06-06 DIAGNOSIS — R69 Illness, unspecified: Secondary | ICD-10-CM | POA: Diagnosis not present

## 2021-06-06 DIAGNOSIS — F332 Major depressive disorder, recurrent severe without psychotic features: Secondary | ICD-10-CM | POA: Diagnosis not present

## 2021-06-06 DIAGNOSIS — F341 Dysthymic disorder: Secondary | ICD-10-CM | POA: Diagnosis not present

## 2021-06-06 DIAGNOSIS — F410 Panic disorder [episodic paroxysmal anxiety] without agoraphobia: Secondary | ICD-10-CM | POA: Diagnosis not present

## 2021-06-13 ENCOUNTER — Encounter: Payer: Self-pay | Admitting: Internal Medicine

## 2021-06-24 ENCOUNTER — Other Ambulatory Visit: Payer: Self-pay

## 2021-06-24 ENCOUNTER — Ambulatory Visit
Admission: RE | Admit: 2021-06-24 | Discharge: 2021-06-24 | Disposition: A | Discharge status: Home / Self Care | Payer: Medicare HMO | Source: Ambulatory Visit | Attending: Thoracic Surgery (Cardiothoracic Vascular Surgery) | Admitting: Thoracic Surgery (Cardiothoracic Vascular Surgery)

## 2021-06-24 ENCOUNTER — Encounter: Payer: Self-pay | Admitting: Thoracic Surgery (Cardiothoracic Vascular Surgery)

## 2021-06-24 ENCOUNTER — Ambulatory Visit: Payer: Medicare HMO | Admitting: Physician Assistant

## 2021-06-24 ENCOUNTER — Other Ambulatory Visit: Payer: Self-pay | Admitting: *Deleted

## 2021-06-24 VITALS — BP 145/79 | HR 64 | Resp 20 | Ht 70.0 in | Wt 166.0 lb

## 2021-06-24 DIAGNOSIS — I7121 Aneurysm of the ascending aorta, without rupture: Secondary | ICD-10-CM | POA: Diagnosis not present

## 2021-06-24 DIAGNOSIS — I2510 Atherosclerotic heart disease of native coronary artery without angina pectoris: Secondary | ICD-10-CM | POA: Diagnosis not present

## 2021-06-24 DIAGNOSIS — I712 Thoracic aortic aneurysm, without rupture, unspecified: Secondary | ICD-10-CM | POA: Diagnosis not present

## 2021-06-24 DIAGNOSIS — J439 Emphysema, unspecified: Secondary | ICD-10-CM | POA: Diagnosis not present

## 2021-06-24 DIAGNOSIS — I359 Nonrheumatic aortic valve disorder, unspecified: Secondary | ICD-10-CM

## 2021-06-24 MED ORDER — IOPAMIDOL (ISOVUE-370) INJECTION 76%
75.0000 mL | Freq: Once | INTRAVENOUS | Status: AC | PRN
  Administered 2021-06-24: 15:00:00 75 mL via INTRAVENOUS

## 2021-06-24 NOTE — Patient Instructions (Signed)
Continue to avoid strenuous activity limited to no more than 50 pounds lifting, pushing, or pulling.  Continue to carefully monitor and manage blood pressure.  We will schedule echocardiogram within the next few weeks  Follow-up 6 months with repeat chest CTA

## 2021-06-24 NOTE — Progress Notes (Signed)
Eugene Guzman       ,[place] [address]             [phone]       HPI:  Mr. Is a 65 year old gentleman with a history of anxiety, depression, dyslipidemia, hypertension, and coronary calcification on CT.  He underwent CT scanning for calcium score in 2021 that showed pending coronary calcification but a subsequent stress test was negative for ischemia.  He was, however, incidentally found to have a 4.5 cm ascending aortic aneurysm.  He was seen by Dr. Roxan Hockey 6 months ago with repeat CTA that showed no significant change in the size or configuration of the ascending aorta measuring at 4.3 cm at its greatest diameter.  Eugene Guzman returns today for 6 months follow-up after having surveillance CTA earlier today.  He denies having any chest pain or shortness of breath.  He continues to have some fatigue but this is intermittent.  He was able to work on building distance around his property a few weeks ago without any unexpected fatigue or shortness of breath.    Current Outpatient Medications  Medication Sig Dispense Refill   ALPRAZolam (XANAX) 1 MG tablet Take 1 mg by mouth daily as needed.     amLODipine (NORVASC) 2.5 MG tablet Take 0.5 tablets (1.25 mg total) by mouth daily. 45 tablet 3   Desvenlafaxine ER 100 MG TB24      ezetimibe (ZETIA) 10 MG tablet Take 1 tablet (10 mg total) by mouth daily. 90 tablet 3   finasteride (PROSCAR) 5 MG tablet Take 5 mg by mouth daily.     rosuvastatin (CRESTOR) 20 MG tablet Take 1 tablet (20 mg total) by mouth daily. 90 tablet 3   Vitamin D, Ergocalciferol, (DRISDOL) 1.25 MG (50000 UNIT) CAPS capsule Take 1 capsule (50,000 Units total) by mouth every 7 (seven) days for 12 doses. 12 capsule 0   No current facility-administered medications for this visit.    Physical Exam:  Vital signs BP 145/79 Pulse 64 Respirations 20 SPO2 99% on room air  General: Well-developed 65 year old male in no acute distress.  He is  accompanied by his wife today. Neck: No carotid bruits Heart: regular rate and rhythm, no murmur. Chest: Breath sounds are full, equal, and clear to auscultation. Extremities: All well perfused, no edema  Diagnostic Tests: CLINICAL DATA:  Follow-up TAA   EXAM: CT ANGIOGRAPHY CHEST WITH CONTRAST   TECHNIQUE: Multidetector CT imaging of the chest was performed using the standard protocol during bolus administration of intravenous contrast. Multiplanar CT image reconstructions and MIPs were obtained to evaluate the vascular anatomy.   CONTRAST:  81mL ISOVUE-370 IOPAMIDOL (ISOVUE-370) INJECTION 76%   COMPARISON:  12/26/2020   FINDINGS: Cardiovascular: Preferential opacification of the thoracic aorta. Unchanged enlargement of the tubular ascending thoracic aorta, measuring up to 4.5 x 4.5 cm. Aortic valve measures 2.9 cm. Aortic valve calcification. Sinuses of Valsalva measure 4.2 cm. The descending thoracic aorta is normal in caliber measuring up to 2.8 x 2.2 cm near the diaphragmatic hiatus. Normal heart size. Three-vessel coronary artery calcifications. No pericardial effusion.   Mediastinum/Nodes: No enlarged mediastinal, hilar, or axillary lymph nodes. Thyroid gland, trachea, and esophagus demonstrate no significant findings.   Lungs/Pleura: Mild centrilobular emphysema. Background of fine centrilobular pulmonary nodules, most concentrated in the lung apices. No pleural effusion or pneumothorax.   Upper Abdomen: No acute abnormality.   Musculoskeletal: No chest wall abnormality. No acute or significant osseous findings.  Review of the MIP images confirms the above findings.   IMPRESSION: 1. Unchanged enlargement of the tubular ascending thoracic aorta, measuring up to 4.5 x 4.5 cm. Ascending thoracic aortic aneurysm. Recommend semi-annual imaging followup by CTA or MRA and referral to cardiothoracic surgery if not already obtained. This recommendation follows 2010  ACCF/AHA/AATS/ACR/ASA/SCA/SCAI/SIR/STS/SVM Guidelines for the Diagnosis and Management of Patients With Thoracic Aortic Disease. Circulation. 2010; 121: F842-J031. Aortic aneurysm NOS (ICD10-I71.9) 2. Aortic valve calcification. Correlate for echocardiographic evidence of aortic valve dysfunction. 3. Emphysema. 4. Background of fine centrilobular pulmonary nodules, most concentrated in the lung apices, most commonly seen in smoking-related respiratory bronchiolitis. 5. Coronary artery disease.   Emphysema (ICD10-J43.9).     Electronically Signed   By: Delanna Ahmadi M.D.   On: 06/24/2021 15:02    Impression / Plan:  Pleasant 65 year old gentleman with an ascending aortic aneurysm measuring 4.5 cm it was noted incidentally on calcium scoring CT in 2021.  CTA obtained today shows stability of the ascending aortic dilation.  There was mention of aortic valve calcification and echo was recommended.   We again discussed the importance of careful management of blood pressure and avoiding strenuous activity limited to no more than 50 pounds of lifting, pushing, or pulling.. We will schedule for an echocardiogram in the next several weeks and follow-up CTA of the chest in 6 months.    Eugene Odea, PA-C [facility] [phone]

## 2021-06-25 ENCOUNTER — Encounter (HOSPITAL_COMMUNITY): Payer: Self-pay

## 2021-07-16 ENCOUNTER — Ambulatory Visit (HOSPITAL_COMMUNITY): Payer: Medicare HMO | Attending: Cardiology

## 2021-07-16 ENCOUNTER — Other Ambulatory Visit: Payer: Self-pay

## 2021-07-16 DIAGNOSIS — I351 Nonrheumatic aortic (valve) insufficiency: Secondary | ICD-10-CM | POA: Diagnosis not present

## 2021-07-16 DIAGNOSIS — I359 Nonrheumatic aortic valve disorder, unspecified: Secondary | ICD-10-CM | POA: Insufficient documentation

## 2021-07-16 LAB — ECHOCARDIOGRAM COMPLETE
AR max vel: 2.02 cm2
AV Area VTI: 2.05 cm2
AV Area mean vel: 2.06 cm2
AV Mean grad: 8 mmHg
AV Peak grad: 14.4 mmHg
Ao pk vel: 1.9 m/s
Area-P 1/2: 3.12 cm2
P 1/2 time: 504 msec
S' Lateral: 3.4 cm

## 2021-07-17 ENCOUNTER — Other Ambulatory Visit (HOSPITAL_COMMUNITY): Payer: Medicare HMO

## 2021-07-22 ENCOUNTER — Ambulatory Visit: Payer: Medicare HMO | Admitting: Neurology

## 2021-07-22 ENCOUNTER — Encounter: Payer: Self-pay | Admitting: Neurology

## 2021-07-22 VITALS — BP 140/76 | HR 66 | Ht 70.5 in | Wt 170.5 lb

## 2021-07-22 DIAGNOSIS — R0683 Snoring: Secondary | ICD-10-CM

## 2021-07-22 DIAGNOSIS — G4733 Obstructive sleep apnea (adult) (pediatric): Secondary | ICD-10-CM

## 2021-07-22 DIAGNOSIS — I7121 Aneurysm of the ascending aorta, without rupture: Secondary | ICD-10-CM

## 2021-07-22 DIAGNOSIS — I10 Essential (primary) hypertension: Secondary | ICD-10-CM | POA: Diagnosis not present

## 2021-07-22 DIAGNOSIS — I2510 Atherosclerotic heart disease of native coronary artery without angina pectoris: Secondary | ICD-10-CM

## 2021-07-22 NOTE — Progress Notes (Addendum)
SLEEP MEDICINE CLINIC    Provider:  Larey Seat, MD  Primary Care Physician:  Isaac Bliss, Rayford Halsted, [place] [address]     Referring Provider: Isaac Bliss, Rayford Halsted, [place],  [place] [address]          Chief Complaint according to patient   Patient presents with:     New Patient (Initial Visit)      Pt referred by Dr. Isaac Bliss for possible OSA. Pt has been told by wife of his loud snoring and has witnessed sleep apnea in pt. Does not have fatigue during the day. No prior SS.       HISTORY OF PRESENT ILLNESS:  Eugene Guzman is a 66 y.o. Caucasian male patient seen here as a referral on 07/22/2021 from PCP for a  sleep consult.  Chief concern according to patient: " I snored all my life, even in college. My wife sleeps through it, but reports witnessing apnea . And I am a mouth breather.  I had tonsils and adenoids taken at age 47, and that was done to help me breathe, I wore braces".    I have the pleasure of seeing Eugene Guzman on 1=17-2023,  a right-handed Caucasian male with a possible sleep disorder.  He has a past medical history of Health Anxiety, Aortic Thoracic Aneurysm ([place]), Coronary artery calcification seen on CT scan, Depressed, and Hypertension.Abnormal calcium score and abnormal stress test.   Eugene Guzman returns for follow-up of his aortic root and ascending aneurysm. Eugene Guzman is a 66 year old man with a history of anxiety, depression, coronary calcification on CT, dyslipidemia, and borderline hypertension.  He does have a family history of CAD as well.  He had a CT for coronary calcium score done in 2021.  He had a lot of calcium in his coronaries but a stress test showed no evidence of ischemia.  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      Sleep relevant medical history: Nocturia 2-4 times - BPH, Prostate related. Urge incontinence. Rib fracture.  Sleep  walking in childhood,  Tonsillectomy yes, no deviated septum repair, no UPPP? all his live depressed, hypothymia.  Family medical /sleep history: no other family member on CPAP with OSA, insomnia, sleep walkers.  Father died at age 34.    Social history:  [place], smoked in HS. Patient is working as Optometrist, still part-time- and lives in a household with  spouse, no pets. Quit 2016 , cigarettes.  The patient currently works part time . Tobacco use: .former smoker   ETOH use: 2 beers a night , Caffeine intake in form of Coffee( /) Soda( /) Tea ( lunchtime 3-4 glasses / total of 36 ounces) . Regular exercise in form of yard work.   Hobbies :woodworking- Farm work.   Sleep habits are as follows: The patient's dinner time is between 5-6 PM.  The patient goes to bed at  12-2 AM, but falls asleep on the couch , watching TV.   and continues to sleep for 6 hours, wakes for many bathroom breaks.   The preferred sleep position is supine , with the support of 1-2 pillows. Shoulder pain on the right.  Dreams are reportedly frequent/vivid, not pleasant, night mares.  No PTSD.   9 AM is the usual rise time. The patient wakes up spontaneously at 8 AM. This is new- he  used to be a later sleeper.  He reports not feeling refreshed or restored in AM, with symptoms such as morning headaches, and residual fatigue.  Naps are taken infrequently, not needing on Abilify.  Allowing for better nocturnal sleep.    Review of Systems: Out of a complete 14 system review, the patient complains of only the following symptoms, and all other reviewed systems are negative.:  Fatigue, sleepiness , snoring, fragmented sleep, NOCTURIA  Panic attacks,   Worried.     How likely are you to doze in the following situations: 0 = not likely, 1 = slight chance, 2 = moderate chance, 3 = high chance   Sitting and Reading? Watching Television? Sitting inactive in a public place (theater or meeting)? As a  passenger in a car for an hour without a break? Lying down in the afternoon when circumstances permit? Sitting and talking to someone? Sitting quietly after lunch without alcohol? In a car, while stopped for a few minutes in traffic?   Total = 4/ 24 points   FSS endorsed at na/ 63 points.   Social History   Socioeconomic History   Marital status: Married    Spouse name: Eugene Guzman   Number of children: 0   Years of education: Not on file   Highest education level: Bachelor's degree (e.g., BA, AB, BS)  Occupational History   Not on file  Tobacco Use   Smoking status: Former    Types: Cigars   Smokeless tobacco: Never  Substance and Sexual Activity   Alcohol use: Yes    Alcohol/week: 10.0 standard drinks    Types: 10 Cans of beer per week    Comment: beer occasionally   Drug use: No   Sexual activity: Not on file  Other Topics Concern   Not on file  Social History Narrative   Lives with wife   Right handed   Caffeine: ice tea daily   Social Determinants of Health   Financial Resource Strain: Not on file  Food Insecurity: Not on file  Transportation Needs: Not on file  Physical Activity: Not on file  Stress: Not on file  Social Connections: Not on file    Family History  Problem Relation Age of Onset   Hypertension Mother    Heart attack Father    Ovarian cancer Maternal Grandmother    Colon cancer Paternal Grandfather     Past Medical History:  Diagnosis Date   Anxiety    Aortic aneurysm ([place])    Coronary artery calcification seen on CT scan    Depressed    Hypertension     Past Surgical History:  Procedure Laterality Date   TONSILLECTOMY  1961     Current Outpatient Medications on File Prior to Visit  Medication Sig Dispense Refill   ALPRAZolam (XANAX) 1 MG tablet Take 1 mg by mouth daily as needed.     amLODipine (NORVASC) 2.5 MG tablet Take 0.5 tablets (1.25 mg total) by mouth daily. 45 tablet 3   ARIPiprazole (ABILIFY) 2 MG tablet Take 2 mg by  mouth daily.     Desvenlafaxine ER 100 MG TB24      ezetimibe (ZETIA) 10 MG tablet Take 1 tablet (10 mg total) by mouth daily. 90 tablet 3   finasteride (PROSCAR) 5 MG tablet Take 5 mg by mouth daily.     rosuvastatin (CRESTOR) 20 MG tablet Take 1 tablet (20 mg total) by mouth daily. 90 tablet 3   No current facility-administered medications on file  prior to visit.    Allergies  Allergen Reactions   Penicillins     Physical exam:  Today's Vitals   07/22/21 1052  BP: 140/76  Pulse: 66  Weight: 170 lb 8 oz (77.3 kg)  Height: 5' 10.5" (1.791 m)   Body mass index is 24.12 kg/m.   Wt Readings from Last 3 Encounters:  07/22/21 170 lb 8 oz (77.3 kg)  06/24/21 166 lb (75.3 kg)  05/06/21 160 lb (72.6 kg)     Ht Readings from Last 3 Encounters:  07/22/21 5' 10.5" (1.791 m)  06/24/21 5\' 10"  (1.778 m)  05/06/21 5' 10.25" (1.784 m)      General: The patient is awake, alert and appears not in acute distress. The patient is well groomed. Head: Normocephalic, atraumatic. Neck is supple.  Mallampati 3,  neck circumference:16.5 inches. Nasal airflow patent.  Retrognathia is seen.  Small lower jaw  Dental status:  Cardiovascular:  Regular rate and cardiac rhythm by pulse, without distended neck veins. Respiratory: Lungs are clear to auscultation.  Skin:  Without evidence of ankle edema, or rash. Trunk: The patient's posture is erect.   Neurologic exam : The patient is awake and alert, oriented to place and time.   Memory subjective described as intact.  Attention span & concentration ability appears normal.  Speech is fluent,  without  dysarthria, dysphonia or aphasia.  Mood and affect are appropriate.   Cranial nerves: no loss of smell or taste reported  Pupils are equal and briskly reactive to light. Funduscopic exam deferred.  Extraocular movements in vertical and horizontal planes were intact and without nystagmus. No Diplopia. Visual fields by finger perimetry are  intact. Hearing was intact to tuning fork.  Facial sensation intact to fine touch. Facial motor strength is symmetric and tongue and uvula move midline.  Neck ROM : rotation, tilt and flexion extension were normal for age and shoulder shrug was symmetrical.    Motor exam:  Symmetric bulk, tone and ROM.   Normal tone without cog -wheeling, symmetric grip strength .   Sensory:  Fine touch, pinprick and vibration were tested  and  normal.  Proprioception tested in the upper extremities was normal.   Coordination: Rapid alternating movements in the fingers/hands were of normal speed.  The Finger-to-nose maneuver was intact without evidence of ataxia, dysmetria or tremor.   Gait and station: Patient could rise unassisted from a seated position, walked without assistive device.  Stance is of normal width/ base and the patient turned with 3 steps.  Toe and heel walk were deferred.  Deep tendon reflexes: in the upper and lower extremities are symmetric and intact.  Babinski response was normal        After spending a total time of  45  minutes face to face and additional time for physical and neurologic examination, review of laboratory studies,  personal review of imaging studies, reports and results of other testing and review of referral information / records as far as provided in visit, I have established the following assessments:  Medication reviewed, recent labs reviewed, low Vit D diagnosed,   1) loud snoring and witnessed apnea. We screen for OSA . 2) improving sleep times, bedtime moved to midnight through 8 AM and he feels better rested on Abiilify and Effexor. 3) Nocturia- very frequent , attributed to BPH.    My Plan is to proceed with:  1) HST - screening for apnea. Hopefully helping  with HTN and aortic aneurysm.  RV  2-4 months.    I would like to thank Isaac Bliss, Rayford Halsted, MD and Isaac Bliss, Rayford Halsted, [address] [place],  [place] [address] for  allowing me to meet with and to take care of this pleasant patient.     I plan to follow up either personally or through our NP within 2-4 months.    Electronically signed by: Larey Seat, MD 07/22/2021 11:32 AM  [facility] Board certified by The [employer] of Sleep Medicine and Diplomate of the [employer] of Sleep Medicine. Board certified In Neurology through the [place], Fellow of the [employer] of Neurology. Medical Director of [facility].

## 2021-07-22 NOTE — Patient Instructions (Signed)
Screening for Sleep Apnea Sleep apnea is a condition in which breathing pauses or becomes shallow during sleep. Sleep apnea screening is a test to determine if you are at risk for sleep apnea. The test includes a series of questions. It will only takes a few minutes. Your health care provider may ask you to have this test in preparation for surgery or as part of a physical exam. What are the symptoms of sleep apnea? Common symptoms of sleep apnea include: Snoring. Waking up often at night. Daytime sleepiness. Pauses in breathing. Choking or gasping during sleep. Irritability. Forgetfulness. Trouble thinking clearly. Depression. Personality changes. Most people with sleep apnea do not know that they have it. What are the advantages of sleep apnea screening? Getting screened for sleep apnea can help: Ensure your safety. It is important for your health care providers to know whether or not you have sleep apnea, especially if you are having surgery or have other long-term (chronic) health conditions. Improve your health and allow you to get a better night's rest. Restful sleep can help you: Have more energy. Lose weight. Improve high blood pressure. Improve diabetes management. Prevent stroke. Prevent car accidents. What happens during the screening? Screening usually includes being asked a list of questions about your sleep quality. Some questions you may be asked include: Do you snore? Is your sleep restless? Do you have daytime sleepiness? Has a partner or spouse told you that you stop breathing during sleep? Have you had trouble concentrating or memory loss? What is your age? What is your neck circumference? To measure your neck, keep your back straight and gently wrap the tape measure around your neck. Put the tape measure at the middle of your neck, between your chin and collarbone. What is your sex assigned at birth? Do you have or are you being treated for high blood  pressure? If your screening test is positive, you are at risk for the condition. Further testing may be needed to confirm a diagnosis of sleep apnea. Where to find more information You can find screening tools online or at your health care clinic. For more information about sleep apnea screening and healthy sleep, visit these websites: Centers for Disease Control and Prevention: http://www.wolf.info/ American Sleep Apnea Association: www.sleepapnea.org Contact a health care provider if: You think that you may have sleep apnea. Summary Sleep apnea screening can help determine if you are at risk for sleep apnea. It is important for your health care providers to know whether or not you have sleep apnea, especially if you are having surgery or have other chronic health conditions. You may be asked to take a screening test for sleep apnea in preparation for surgery or as part of a physical exam. This information is not intended to replace advice given to you by your health care provider. Make sure you discuss any questions you have with your health care provider. Document Revised: 05/31/2020 Document Reviewed: 05/31/2020 Elsevier Patient Education  2022 [place] Sleep Information, Adult Quality sleep is important for your mental and physical health. It also improves your quality of life. Quality sleep means you: Are asleep for most of the time you are in bed. Fall asleep within 30 minutes. Wake up no more than once a night.  Are awake for no longer than 20 minutes if you do wake up during the night. Most adults need 7-8 hours of quality sleep each night. How can poor sleep affect me? If you do not get enough quality sleep,  you may have: Mood swings. Daytime sleepiness. Confusion. Decreased reaction time. Sleep disorders, such as insomnia and sleep apnea. Difficulty with: Solving problems. Coping with stress. Paying attention. These issues may affect your performance and productivity  at work, school, and at home. Lack of sleep may also put you at higher risk for accidents, suicide, and risky behaviors. If you do not get quality sleep you may also be at higher risk for several health problems, including: Infections. Type 2 diabetes. Heart disease. High blood pressure. Obesity. Worsening of long-term conditions, like arthritis, kidney disease, depression, Parkinson's disease, and epilepsy. What actions can I take to get more quality sleep?   Stick to a sleep schedule. Go to sleep and wake up at about the same time each day. Do not try to sleep less on weekdays and make up for lost sleep on weekends. This does not work. Try to get about 30 minutes of exercise on most days. Do not exercise 2-3 hours before going to bed. Limit naps during the day to 30 minutes or less. Do not use any products that contain nicotine or tobacco, such as cigarettes or e-cigarettes. If you need help quitting, ask your health care provider. Do not drink caffeinated beverages for at least 8 hours before going to bed. Coffee, tea, and some sodas contain caffeine. Do not drink alcohol close to bedtime. Do not eat large meals close to bedtime. Do not take naps in the late afternoon. Try to get at least 30 minutes of sunlight every day. Morning sunlight is best. Make time to relax before bed. Reading, listening to music, or taking a hot bath promotes quality sleep. Make your bedroom a place that promotes quality sleep. Keep your bedroom dark, quiet, and at a comfortable room temperature. Make sure your bed is comfortable. Take out sleep distractions like TV, a computer, smartphone, and bright lights. If you are lying awake in bed for longer than 20 minutes, get up and do a relaxing activity until you feel sleepy. Work with your health care provider to treat medical conditions that may affect sleeping, such as: Nasal obstruction. Snoring. Sleep apnea and other sleep disorders. Talk to your health care  provider if you think any of your prescription medicines may cause you to have difficulty falling or staying asleep. If you have sleep problems, talk with a sleep consultant. If you think you have a sleep disorder, talk with your health care provider about getting evaluated by a specialist. Where to find more information [place] website: https://sleepfoundation.org National Heart, Lung, and [place] ([place]): http://www.saunders.info/.pdf Centers for Disease Control and Prevention (CDC): LearningDermatology.pl Contact a health care provider if you: Have trouble getting to sleep or staying asleep. Often wake up very early in the morning and cannot get back to sleep. Have daytime sleepiness. Have daytime sleep attacks of suddenly falling asleep and sudden muscle weakness (narcolepsy). Have a tingling sensation in your legs with a strong urge to move your legs (restless legs syndrome). Stop breathing briefly during sleep (sleep apnea). Think you have a sleep disorder or are taking a medicine that is affecting your quality of sleep. Summary Most adults need 7-8 hours of quality sleep each night. Getting enough quality sleep is an important part of health and well-being. Make your bedroom a place that promotes quality sleep and avoid things that may cause you to have poor sleep, such as alcohol, caffeine, smoking, and large meals. Talk to your health care provider if you have trouble falling  asleep or staying asleep. This information is not intended to replace advice given to you by your health care provider. Make sure you discuss any questions you have with your health care provider. Document Revised: 09/29/2017 Document Reviewed: 09/29/2017 Elsevier Patient Education  [place].

## 2021-07-25 ENCOUNTER — Encounter: Payer: Self-pay | Admitting: Internal Medicine

## 2021-07-25 DIAGNOSIS — F410 Panic disorder [episodic paroxysmal anxiety] without agoraphobia: Secondary | ICD-10-CM | POA: Diagnosis not present

## 2021-07-25 DIAGNOSIS — F331 Major depressive disorder, recurrent, moderate: Secondary | ICD-10-CM | POA: Diagnosis not present

## 2021-07-25 DIAGNOSIS — R69 Illness, unspecified: Secondary | ICD-10-CM | POA: Diagnosis not present

## 2021-07-30 ENCOUNTER — Other Ambulatory Visit: Payer: Medicare HMO

## 2021-08-06 ENCOUNTER — Ambulatory Visit: Payer: Medicare HMO | Admitting: Internal Medicine

## 2021-08-11 ENCOUNTER — Ambulatory Visit: Payer: Medicare HMO | Admitting: Internal Medicine

## 2021-08-11 ENCOUNTER — Other Ambulatory Visit (INDEPENDENT_AMBULATORY_CARE_PROVIDER_SITE_OTHER): Payer: Medicare HMO

## 2021-08-11 DIAGNOSIS — E559 Vitamin D deficiency, unspecified: Secondary | ICD-10-CM

## 2021-08-11 LAB — VITAMIN D 25 HYDROXY (VIT D DEFICIENCY, FRACTURES): VITD: 37.21 ng/mL (ref 30.00–100.00)

## 2021-08-12 ENCOUNTER — Other Ambulatory Visit: Payer: Self-pay | Admitting: Internal Medicine

## 2021-08-12 DIAGNOSIS — E559 Vitamin D deficiency, unspecified: Secondary | ICD-10-CM

## 2021-08-12 MED ORDER — VITAMIN D (ERGOCALCIFEROL) 1.25 MG (50000 UNIT) PO CAPS: 50000.0000 [IU] | ORAL_CAPSULE | ORAL | 0 refills | Status: AC

## 2021-08-19 DIAGNOSIS — H5203 Hypermetropia, bilateral: Secondary | ICD-10-CM | POA: Diagnosis not present

## 2021-08-27 DIAGNOSIS — F331 Major depressive disorder, recurrent, moderate: Secondary | ICD-10-CM | POA: Diagnosis not present

## 2021-08-27 DIAGNOSIS — F410 Panic disorder [episodic paroxysmal anxiety] without agoraphobia: Secondary | ICD-10-CM | POA: Diagnosis not present

## 2021-08-27 DIAGNOSIS — R69 Illness, unspecified: Secondary | ICD-10-CM | POA: Diagnosis not present

## 2021-08-28 ENCOUNTER — Telehealth: Payer: Self-pay

## 2021-08-28 NOTE — Telephone Encounter (Signed)
LVM for pt to call me back to schedule sleep study  

## 2021-09-08 ENCOUNTER — Ambulatory Visit (INDEPENDENT_AMBULATORY_CARE_PROVIDER_SITE_OTHER): Payer: Medicare HMO | Admitting: Neurology

## 2021-09-08 DIAGNOSIS — I2510 Atherosclerotic heart disease of native coronary artery without angina pectoris: Secondary | ICD-10-CM

## 2021-09-08 DIAGNOSIS — I10 Essential (primary) hypertension: Secondary | ICD-10-CM

## 2021-09-08 DIAGNOSIS — G4734 Idiopathic sleep related nonobstructive alveolar hypoventilation: Secondary | ICD-10-CM

## 2021-09-08 DIAGNOSIS — I7121 Aneurysm of the ascending aorta, without rupture: Secondary | ICD-10-CM

## 2021-09-08 DIAGNOSIS — R058 Other specified cough: Secondary | ICD-10-CM

## 2021-09-08 DIAGNOSIS — G4733 Obstructive sleep apnea (adult) (pediatric): Secondary | ICD-10-CM

## 2021-09-10 ENCOUNTER — Encounter: Payer: Self-pay | Admitting: Neurology

## 2021-09-10 DIAGNOSIS — R0683 Snoring: Secondary | ICD-10-CM

## 2021-09-10 DIAGNOSIS — G4733 Obstructive sleep apnea (adult) (pediatric): Secondary | ICD-10-CM

## 2021-09-10 NOTE — Progress Notes (Signed)
IMPRESSION:  This HST confirms the presence of severe obstructive sleep apnea not REM sleep dependent and also not positional dependent but accentuated in supine sleep.  The most critical finding was a severe hypoxia. ?? ?RECOMMENDATION: I would request the urgent return for this patient for an in lab CPAP or BiPAP titration that would allow [place] not just to control his apnea but hopefully also to titrate according to his oxygen needs.  This patient may need additional oxygen at night. ?Only an attended sleep study can be however titrated to oxygen as well as to positive airway pressure. ?? ?If his insurance would not allow the return for an attended titration I will order urgently a CPAP device 5 through 20 cmH2O pressure 2 cm EPR heated humidification and mask of patient's choice.   ?This patient is not present in his HST a severe snorer, he should be free to choose any  interface as comfortable. ?? ??

## 2021-09-10 NOTE — Procedures (Signed)
? ?  ?  ?[facility] ?  ?HOME SLEEP TEST REPORT ( by [facility])   ?STUDY DATE:  09-09-2021 ?  ?ORDERING CLINICIAN: Larey Seat, MD  ?REFERRING CLINICIAN: Dr Isaac Bliss, MD  ?  " I snored all my life, even in college. My wife sleeps through it, but reports witnessing apnea . And I am a mouth breather.  I had tonsils and adenoids taken at age 66, and that was done to help me breathe, I wore braces". ? 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 Cardiac; Abnormal calcium score and abnormal stress test.  ? ?  ?Epworth sleepiness score: 4/24. ?  ?BMI: 23.8 kg/m? ?  ?Neck Circumference: 17" ?  ?FINDINGS: ?  ?Sleep Summary: ?  ?Total Recording Time (hours, min): Total recording time amounted to 9 hours 36 minutes of which 8 hours and 57 minutes was a total calculated sleep time.  REM sleep proportion was 33.3%.     ?           ?  ?Respiratory Indices: ?  ?Calculated pAHI (per hour):   Overall apnea-hypopnea index was high at 47.9/h and did not vary much between REM and non-REM sleep stages.   ?REM sleep AHI was 43.4 and normal REM sleep AHI 50.2/h                                      ?  ?Positional AHI: Supine AHI was associated with 54.0 apneas and hypopneas per hour the sleep on the left with an AHI of 23.9/h.  Right-sided sleep also had a high AHI at 43.0/h. ? ?Snoring data show a mean volume of 41 dB with snoring accompanying 17% of total sleep time.  This is mild to moderate.                                                ?  ?Oxygen Saturation Statistics: ?  ?Oxygen Saturation (%) Mean: The mean oxygen saturation was 90% with a minimum of 71 and a maximum of 99% oxygenation                                 ?  ?O2 Saturation (minutes) <89%:   157.3 minutes the equivalent of 29% total sleep time. ?O2 saturation  (minutes) less than 90% was 178 minutes was 33% of total sleep time. ? ?There were even 35 minutes below 80% saturation.  This is severe hypoxia.      ?  ?Pulse Rate Statistics: ?  ?Pulse Mean (bpm):      60 bpm         ?  ?Pulse Range:     Between 48 and 90 bpm          ?  ?IMPRESSION:  This HST confirms the presence of severe obstructive sleep apnea not REM sleep dependent and also not positional dependent but accentuated in supine sleep.  The most critical finding was a severe hypoxia. ?  ?RECOMMENDATION: I would request the  urgent return for this patient for an in lab CPAP or BiPAP titration that would allow [place] not just to control his apnea but hopefully also to titrate according to his oxygen needs.  This patient may need additional oxygen at night. ?Only an attended sleep study can be however titrated to oxygen as well as to positive airway pressure. ? ?If his insurance would not allow the return for an attended titration I will order urgently a CPAP device 5 through 20 cmH2O pressure 2 cm EPR heated humidification and mask of patient's choice.  This patient is not a severe snorer, he should be free to choose a nasal pillow or other interface as comfortable. ? ?  ?INTERPRETING PHYSICIAN: ? ? Larey Seat, MD  ? ?Medical Director of [facility].  ? ? ? ? ? ? ? ? ? ? ? ? ? ? ? ? ? ? ?

## 2021-09-10 NOTE — Addendum Note (Signed)
Addended by: Larey Seat on: 09/10/2021 05:27 PM ? ? Modules accepted: Orders ? ?

## 2021-09-10 NOTE — Progress Notes (Signed)
? ? ? ? ? ?  ?  ?[facility] ?  ?HOME SLEEP TEST REPORT ( by Watch PAT)   ?STUDY DATE:  09-09-2021 ?  ?ORDERING CLINICIAN: Larey Seat, MD  ?REFERRING CLINICIAN: Dr Isaac Bliss, MD  ?  " I snored all my life, even in college. My wife sleeps through it, but reports witnessing apnea . And I am a mouth breather.  I had tonsils and adenoids taken at age 66, and that was done to help me breathe, I wore braces". ? ?I had the pleasure of seeing Eugene Guzman on 07-22-2021,  a right-handed Caucasian male with a  past medical history of Health Anxiety, Aortic Thoracic Aneurysm ([place]), Coronary artery calcification seen on CT scan,  and Hypertension. Cardiac; Abnormal calcium score and abnormal stress test.  ? ?  ?Epworth sleepiness score: 4/24. ?  ?BMI: 23.8 kg/m? ?  ?Neck Circumference: 17" ?  ?FINDINGS: ?  ?Sleep Summary: ?  ?Total Recording Time (hours, min): Total recording time amounted to 9 hours 36 minutes of which 8 hours and 57 minutes was a total calculated sleep time.  REM sleep proportion was 33.3%.     ?           ?  ?Respiratory Indices: ?  ?Calculated pAHI (per hour):   Overall apnea-hypopnea index was high at 47.9/h and did not vary much between REM and non-REM sleep stages.   ?REM sleep AHI was 43.4 and normal REM sleep AHI 50.2/h                                      ?  ?Positional AHI: Supine AHI was associated with 54.0 apneas and hypopneas per hour the sleep on the left with an AHI of 23.9/h.  Right-sided sleep also had a high AHI at 43.0/h. ? ?Snoring data show a mean volume of 41 dB with snoring accompanying 17% of total sleep time.  This is mild to moderate.                                                ?  ?Oxygen Saturation Statistics: ?  ?Oxygen Saturation (%) Mean: The mean oxygen saturation was 90% with a minimum of 71 and a maximum of 99% oxygenation                                 ?  ?O2 Saturation (minutes) <89%:   157.3 minutes the equivalent of 29% total sleep time. ?O2  saturation (minutes) less than 90% was 178 minutes was 33% of total sleep time. ? ?There were even 35 minutes below 80% saturation.  This is severe hypoxia.      ?  ?Pulse Rate Statistics: ?  ?Pulse Mean (bpm):      60 bpm         ?  ?Pulse Range:     Between 48 and 90 bpm          ?  ?IMPRESSION:  This HST confirms the presence of severe obstructive sleep apnea not REM sleep dependent and also not positional dependent but accentuated in supine sleep.  The most critical finding was a severe hypoxia. ?  ?RECOMMENDATION:  I would request the urgent return for this patient for an in lab CPAP or BiPAP titration that would allow [place] not just to control his apnea but hopefully also to titrate according to his oxygen needs.  This patient may need additional oxygen at night. ?Only an attended sleep study can be however titrated to oxygen as well as to positive airway pressure. ? ?If his insurance would not allow the return for an attended titration I will order urgently a CPAP device 5 through 20 cmH2O pressure 2 cm EPR heated humidification and mask of patient's choice.  This patient is not a severe snorer, he should be free to choose a nasal pillow or other interface as comfortable. ? ?  ?INTERPRETING PHYSICIAN: ? ? Larey Seat, MD  ? ?Medical Director of [facility].  ? ? ? ? ? ? ? ? ? ? ? ? ? ? ? ? ? ? ? ? ?

## 2021-09-18 ENCOUNTER — Ambulatory Visit (INDEPENDENT_AMBULATORY_CARE_PROVIDER_SITE_OTHER): Payer: Medicare HMO | Admitting: Internal Medicine

## 2021-09-18 VITALS — BP 120/68 | HR 64 | Temp 97.4°F | Wt 171.1 lb

## 2021-09-18 DIAGNOSIS — J069 Acute upper respiratory infection, unspecified: Secondary | ICD-10-CM | POA: Diagnosis not present

## 2021-09-18 DIAGNOSIS — R059 Cough, unspecified: Secondary | ICD-10-CM | POA: Diagnosis not present

## 2021-09-18 DIAGNOSIS — J029 Acute pharyngitis, unspecified: Secondary | ICD-10-CM

## 2021-09-18 LAB — POCT INFLUENZA A/B
Influenza A, POC: NEGATIVE
Influenza B, POC: NEGATIVE

## 2021-09-18 LAB — POC COVID19 BINAXNOW: SARS Coronavirus 2 Ag: NEGATIVE

## 2021-09-18 LAB — POCT RAPID STREP A (OFFICE): Rapid Strep A Screen: NEGATIVE

## 2021-09-18 NOTE — Progress Notes (Signed)
? ? ? ?Acute office Visit ? ? ? ? ?This visit occurred during the SARS-CoV-2 public health emergency.  Safety protocols were in place, including screening questions prior to the visit, additional usage of staff PPE, and extensive cleaning of exam room while observing appropriate contact time as indicated for disinfecting solutions.  ? ? ?CC/Reason for Visit: URI symptoms ? ?HPI: Eugene Guzman is a 66 y.o. male who is coming in today for the above mentioned reasons.  For the past 2 weeks has noticed an increasing dry cough with rhinorrhea congestion sneezing and a sore throat.  2 days ago his symptoms got progressively worse.  He only used Delsym for 1 dose but feels like it is not helping.  No recent travels or sick contacts, no fever, no shortness of breath. ? ?Past Medical/Surgical History: ?Past Medical History:  ?Diagnosis Date  ? Anxiety   ? Aortic aneurysm ([place])   ? Coronary artery calcification seen on CT scan   ? Depressed   ? Hypertension   ? ? ?Past Surgical History:  ?Procedure Laterality Date  ? TONSILLECTOMY  1961  ? ? ?Social History: ? reports that he has quit smoking. His smoking use included cigars. He has never used smokeless tobacco. He reports current alcohol use of about 10.0 standard drinks per week. He reports that he does not use drugs. ? ?Allergies: ?Allergies  ?Allergen Reactions  ? Penicillins   ? ? ?Family History:  ?Family History  ?Problem Relation Age of Onset  ? Hypertension Mother   ? Heart attack Father   ? Ovarian cancer Maternal Grandmother   ? Colon cancer Paternal Grandfather   ? ? ? ?Current Outpatient Medications:  ?  amLODipine (NORVASC) 2.5 MG tablet, Take 0.5 tablets (1.25 mg total) by mouth daily., Disp: 45 tablet, Rfl: 3 ?  ARIPiprazole (ABILIFY) 2 MG tablet, Take 2 mg by mouth daily., Disp: , Rfl:  ?  desvenlafaxine (PRISTIQ) 25 MG 24 hr tablet, 3 tablets qd, Disp: , Rfl:  ?  ezetimibe (ZETIA) 10 MG tablet, Take 1 tablet (10 mg total) by mouth daily., Disp: 90 tablet,  Rfl: 3 ?  finasteride (PROSCAR) 5 MG tablet, Take 5 mg by mouth daily., Disp: , Rfl:  ?  rosuvastatin (CRESTOR) 20 MG tablet, Take 1 tablet (20 mg total) by mouth daily., Disp: 90 tablet, Rfl: 3 ?  Vitamin D, Ergocalciferol, (DRISDOL) 1.25 MG (50000 UNIT) CAPS capsule, Take 1 capsule (50,000 Units total) by mouth every 7 (seven) days for 12 doses., Disp: 12 capsule, Rfl: 0 ? ?Review of Systems:  ?Constitutional: Denies fever, chills, diaphoresis, appetite change.  ?HEENT: Denies photophobia, eye pain, redness, mouth sores, trouble swallowing, neck pain, neck stiffness and tinnitus.   ?Respiratory: Denies SOB, DOE, chest tightness,  and wheezing.   ?Cardiovascular: Denies chest pain, palpitations and leg swelling.  ?Gastrointestinal: Denies nausea, vomiting, abdominal pain, diarrhea, constipation, blood in stool and abdominal distention.  ?Genitourinary: Denies dysuria, urgency, frequency, hematuria, flank pain and difficulty urinating.  ?Endocrine: Denies: hot or cold intolerance, sweats, changes in hair or nails, polyuria, polydipsia. ?Musculoskeletal: Denies myalgias, back pain, joint swelling, arthralgias and gait problem.  ?Skin: Denies pallor, rash and wound.  ?Neurological: Denies dizziness, seizures, syncope, weakness, light-headedness, numbness and headaches.  ?Hematological: Denies adenopathy. Easy bruising, personal or family bleeding history  ?Psychiatric/Behavioral: Denies suicidal ideation, mood changes, confusion, nervousness, sleep disturbance and agitation ? ? ? ?Physical Exam: ?Vitals:  ? 09/18/21 1447  ?BP: 120/68  ?Pulse: 64  ?Temp: Marland Kitchen)  97.4 ?F (36.3 ?C)  ?TempSrc: Oral  ?SpO2: 98%  ?Weight: 171 lb 1.6 oz (77.6 kg)  ? ? ?Body mass index is 24.2 kg/m?. ? ? ?Constitutional: NAD, calm, comfortable ?Eyes: PERRL, lids and conjunctivae normal ?ENMT: Mucous membranes are moist. Posterior pharynx is erythematous but clear of any exudate or lesions. Normal dentition. Tympanic membrane is pearly white, no  erythema or bulging. ?Respiratory: clear to auscultation bilaterally, no wheezing, no crackles. Normal respiratory effort. No accessory muscle use.  ?Cardiovascular: Regular rate and rhythm, no murmurs / rubs / gallops. No extremity edema.  ?Neurologic: Grossly intact and nonfocal ?Psychiatric: Normal judgment and insight. Alert and oriented x 3. Normal mood.  ? ? ?Impression and Plan: ? ?Cough, unspecified type - Plan: POC Influenza A/B, POC COVID-19, POC Rapid Strep A ? ?Sore throat - Plan: POC Influenza A/B, POC COVID-19, POC Rapid Strep A ? ?Viral URI with cough ? ?-In office COVID, flu and strep were negative. ? ?-Given exam findings, PNA, pharyngitis, ear infection are not likely, hence abx have not been prescribed. ?-Have advised rest, fluids, OTC antihistamines, cough suppressants and mucinex. ?-RTC if no improvement in 10-14 days. ? ? ?Time spent: 22 minutes reviewing chart, interviewing and examining patient and formulating plan of care. ? ? ? ?Lelon Frohlich, MD ?[facility] ? ? ?

## 2021-09-22 ENCOUNTER — Telehealth: Payer: Self-pay

## 2021-09-22 DIAGNOSIS — F331 Major depressive disorder, recurrent, moderate: Secondary | ICD-10-CM | POA: Diagnosis not present

## 2021-09-22 DIAGNOSIS — R69 Illness, unspecified: Secondary | ICD-10-CM | POA: Diagnosis not present

## 2021-09-22 DIAGNOSIS — F410 Panic disorder [episodic paroxysmal anxiety] without agoraphobia: Secondary | ICD-10-CM | POA: Diagnosis not present

## 2021-09-22 NOTE — Telephone Encounter (Signed)
Insurance has denied CPAP titration sleep study. Insurance states that pt has to try AutoPAP first.  ?

## 2021-09-22 NOTE — Telephone Encounter (Signed)
Send mychart message to the pt and will forward the orders to [place].  ?

## 2021-09-24 ENCOUNTER — Telehealth: Payer: Self-pay | Admitting: Neurology

## 2021-09-24 NOTE — Telephone Encounter (Signed)
Pt's wife, Meshulem Onorato pt would like a call from the nurse to discuss titration study vs autopap before making finally decision.  ?

## 2021-09-24 NOTE — Telephone Encounter (Signed)
Called wife back at number provided but pt picked up. All he was wondering is what kind of mask he will be set up on. Advised per order, it will be mask of choice. [facility] will fit him for mask of choice with him. He verbalized understanding. ? ?I scheduled initial cpap f/u for 12/25/21 at 8:30a with Dr. Brett Fairy. Asked he send mychart message day he receives machine to ensure this appt will work. He verbalized understanding and appreciation.  ?

## 2021-09-26 DIAGNOSIS — R69 Illness, unspecified: Secondary | ICD-10-CM | POA: Diagnosis not present

## 2021-09-26 DIAGNOSIS — F330 Major depressive disorder, recurrent, mild: Secondary | ICD-10-CM | POA: Diagnosis not present

## 2021-10-01 DIAGNOSIS — G4733 Obstructive sleep apnea (adult) (pediatric): Secondary | ICD-10-CM | POA: Diagnosis not present

## 2021-10-07 ENCOUNTER — Telehealth: Payer: Self-pay | Admitting: Neurology

## 2021-10-07 NOTE — Telephone Encounter (Signed)
Thanks

## 2021-10-07 NOTE — Telephone Encounter (Signed)
Pt was scheduled for his initial CPAP on 11-13-21  ?Pt was informed to bring machine and power cord to the appointemnt ?DME: [facility] ?[phone] option 1 ?F: [phone]  ? ? ? ?DME: [facility] ?EQAST:[phone] ?Fax:[phone] ?  ?Set up on 10/01/21 w an AirSense 10 Auto ?F/u:11/01/21-11/29/21 ?

## 2021-11-01 DIAGNOSIS — G4733 Obstructive sleep apnea (adult) (pediatric): Secondary | ICD-10-CM | POA: Diagnosis not present

## 2021-11-12 ENCOUNTER — Other Ambulatory Visit: Payer: Self-pay | Admitting: Thoracic Surgery (Cardiothoracic Vascular Surgery)

## 2021-11-12 ENCOUNTER — Other Ambulatory Visit (INDEPENDENT_AMBULATORY_CARE_PROVIDER_SITE_OTHER): Payer: Medicare HMO

## 2021-11-12 DIAGNOSIS — E559 Vitamin D deficiency, unspecified: Secondary | ICD-10-CM

## 2021-11-12 DIAGNOSIS — I7121 Aneurysm of the ascending aorta, without rupture: Secondary | ICD-10-CM

## 2021-11-12 LAB — VITAMIN D 25 HYDROXY (VIT D DEFICIENCY, FRACTURES): VITD: 51.41 ng/mL (ref 30.00–100.00)

## 2021-11-12 NOTE — Progress Notes (Signed)
?[facility] ?[address] ?[place]. [place] [address] ?[phone] ? ?     OFFICE FOLLOW UP NOTE ? ?[name] ?Date of Birth:  Aug 05, 1955 ?Medical Record Number:  [id number]  ? ?Reason for visit: Initial CPAP follow-up ? ? ? ?SUBJECTIVE: ? ? ?CHIEF COMPLAINT:  ?Chief Complaint  ?Patient presents with  ? Obstructive Sleep Apnea  ?  Rm 3 alone ?Pt is well, no concerns with machine. Still adjusting to it.  ? ? ?HPI:  ? ?Update 11/12/2021 JM: Patient returns for initial CPAP follow-up visit.  Completed HST 3/6 which showed severe OSA with calculated AHI 47.9/h as well as severe hypoxia.  Recommended titration study to ensure adequate control of hypoxia but patient wished to proceed with AutoPap initially.  AutoPap initiated 3/29.  Review of compliance report as below showing excellent usage and optimal residual AHI at 4.2. ? ?He has been having some difficulties adjusting to CPAP.  Currently using nasal mask.  At times will feel claustrophobic. Feels he wakes up frequently throughout the night.  at times will need to fall asleep first in his recliner, then will wake up and go to bedroom and put CPAP on, feels like he is able to fall asleep easier when he is more drowsy. Usually wakes up around 6 to 7 AM, will remove mask and sleep for a couple more hours.  Feels more fatigued now since starting than he was prior.  Epworth Sleepiness Scale 5/24 (prior to start 4/24).  Fatigue severity scale 36/63 (no prior level).  ? ? ? ? ? ? ? ? ? ?History provided from Dr. Edwena Felty initial consult visit on 07/22/2021 for reference purposes only ?Eugene Guzman is a 66 y.o. Caucasian male patient seen here as a referral on 07/22/2021 from PCP for a  sleep consult.  ?Chief concern according to patient: " I snored all my life, even in college. My wife sleeps through it, but reports witnessing apnea . And I am a mouth breather.  I had tonsils and adenoids taken at age 25, and that was done to help me breathe,  I wore braces". ?  ?  ?I have the pleasure of seeing Eugene Guzman on 1=17-2023,  a right-handed Caucasian male with a possible sleep disorder.  He has a past medical history of Health Anxiety, Aortic Thoracic Aneurysm ([place]), Coronary artery calcification seen on CT scan, Depressed, and Hypertension.Abnormal calcium score and abnormal stress test.  ?  ?[name] returns for follow-up of his aortic root and ascending aneurysm. ?Eugene Guzman is a 66 year old man with a history of anxiety, depression, coronary calcification on CT, dyslipidemia, and borderline hypertension.  He does have a family history of CAD as well.  He had a CT for coronary calcium score done in 2021.  He had a lot of calcium in his coronaries but a stress test showed no evidence of ischemia.  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  ?  ?   ?Sleep relevant medical history: Nocturia 2-4 times - BPH, Prostate related. Urge incontinence. Rib fracture.  ?Sleep walking in childhood,  Tonsillectomy yes, no deviated septum repair, no UPPP? all his live depressed, hypothymia.  ?Family medical /sleep history: no other family member on CPAP with OSA, insomnia, sleep walkers.  ?Father died at age 71.  ?  ?Social history:  [place], smoked in HS. Patient is working as Optometrist, still part-time- and lives in a household with  spouse, no pets. Quit 2016 , cigarettes.  ?The patient currently works part time . ?Tobacco use: .former smoker   ETOH use: 2 beers a night , Caffeine intake in form of Coffee( /) Soda( /) Tea ( lunchtime 3-4 glasses / total of 36 ounces) . ?Regular exercise in form of yard work.   ?Hobbies :woodworking- Farm work. ?  ?  ?Sleep habits are as follows: The patient's dinner time is between 5-6 PM.  ?The patient goes to bed at  12-2 AM, but falls asleep on the couch , watching TV.  ? and continues to sleep for 6 hours, wakes for many bathroom breaks.   ?The preferred sleep position is supine , with  the support of 1-2 pillows. Shoulder pain on the right.  ?Dreams are reportedly frequent/vivid, not pleasant, night mares.  No PTSD.  ?  ?9 AM is the usual rise time. The patient wakes up spontaneously at 8 AM. This is new- he used to be a later sleeper.  ?He reports not feeling refreshed or restored in AM, with symptoms such as morning headaches, and residual fatigue.  ?Naps are taken infrequently, not needing on Abilify.  Allowing for better nocturnal sleep.  ? ? ? ? ? ?ROS:   ?14 system review of systems performed and negative with exception of those listed in HPI ? ?PMH:  ?Past Medical History:  ?Diagnosis Date  ? Anxiety   ? Aortic aneurysm ([place])   ? Coronary artery calcification seen on CT scan   ? Depressed   ? Hypertension   ? ? ?PSH:  ?Past Surgical History:  ?Procedure Laterality Date  ? TONSILLECTOMY  1961  ? ? ?Social History:  ?Social History  ? ?Socioeconomic History  ? Marital status: Married  ?  Spouse name: [name]  ? Number of children: 0  ? Years of education: Not on file  ? Highest education level: Bachelor's degree (e.g., BA, AB, BS)  ?Occupational History  ? Not on file  ?Tobacco Use  ? Smoking status: Former  ?  Types: Cigars  ? Smokeless tobacco: Never  ?Substance and Sexual Activity  ? Alcohol use: Yes  ?  Alcohol/week: 10.0 standard drinks  ?  Types: 10 Cans of beer per week  ?  Comment: beer occasionally  ? Drug use: No  ? Sexual activity: Not on file  ?Other Topics Concern  ? Not on file  ?Social History Narrative  ? Lives with wife  ? Right handed  ? Caffeine: ice tea daily  ? ?Social Determinants of Health  ? ?Financial Resource Strain: Low Risk   ? Difficulty of Paying Living Expenses: Not hard at all  ?Food Insecurity: No Food Insecurity  ? Worried About Charity fundraiser in the Last Year: Never true  ? Ran Out of Food in the Last Year: Never true  ?Transportation Needs: No Transportation Needs  ? Lack of Transportation (Medical): No  ? Lack of Transportation (Non-Medical): No   ?Physical Activity: Sufficiently Active  ? Days of Exercise per Week: 3 days  ? Minutes of Exercise per Session: 120 min  ?Stress: Stress Concern Present  ? Feeling of Stress : To some extent  ?Social Connections: Moderately Isolated  ? Frequency of Communication with Friends and Family: More than three times a week  ? Frequency of Social Gatherings with Friends and Family: More than three times a week  ? Attends Religious Services: Never  ? Active Member of Clubs or Organizations: No  ? Attends  Club or Organization Meetings: Not on file  ? Marital Status: Married  ?Intimate Partner Violence: Not on file  ? ? ?Family History:  ?Family History  ?Problem Relation Age of Onset  ? Hypertension Mother   ? Heart attack Father   ? Ovarian cancer Maternal Grandmother   ? Colon cancer Paternal Grandfather   ? ? ?Medications:   ?Current Outpatient Medications on File Prior to Visit  ?Medication Sig Dispense Refill  ? amLODipine (NORVASC) 2.5 MG tablet Take 0.5 tablets (1.25 mg total) by mouth daily. 45 tablet 3  ? ARIPiprazole (ABILIFY) 2 MG tablet Take 2 mg by mouth daily.    ? desvenlafaxine (PRISTIQ) 25 MG 24 hr tablet 3 tablets qd    ? ezetimibe (ZETIA) 10 MG tablet Take 1 tablet (10 mg total) by mouth daily. 90 tablet 3  ? finasteride (PROSCAR) 5 MG tablet Take 5 mg by mouth daily.    ? rosuvastatin (CRESTOR) 20 MG tablet Take 1 tablet (20 mg total) by mouth daily. 90 tablet 3  ? ?No current facility-administered medications on file prior to visit.  ? ? ?Allergies:   ?Allergies  ?Allergen Reactions  ? Penicillins   ? ? ? ? ?OBJECTIVE: ? ?Physical Exam ? ?Vitals:  ? 11/13/21 0732  ?BP: 125/75  ?Pulse: (!) 55  ?Weight: 170 lb (77.1 kg)  ?Height: '5\' 10"'$  (1.778 m)  ? ?Body mass index is 24.39 kg/m?Marland Kitchen ?No results found. ? ?General: well developed, well nourished, very pleasant elderly Caucasian male, seated, in no evident distress ?Head: head normocephalic and atraumatic.   ?Neck: supple with no carotid or supraclavicular  bruits, neck circumference 16.5", mallampati 3 ?Cardiovascular: regular rate and rhythm, no murmurs ?Musculoskeletal: no deformity ?Skin:  no rash/petichiae ?Vascular:  Normal pulses all extremities ?  ?Neur

## 2021-11-13 ENCOUNTER — Encounter: Payer: Self-pay | Admitting: Adult Health

## 2021-11-13 ENCOUNTER — Ambulatory Visit: Payer: Medicare HMO | Admitting: Adult Health

## 2021-11-13 VITALS — BP 125/75 | HR 55 | Ht 70.0 in | Wt 170.0 lb

## 2021-11-13 DIAGNOSIS — G4730 Sleep apnea, unspecified: Secondary | ICD-10-CM | POA: Diagnosis not present

## 2021-11-13 DIAGNOSIS — G4734 Idiopathic sleep related nonobstructive alveolar hypoventilation: Secondary | ICD-10-CM

## 2021-11-13 NOTE — Patient Instructions (Addendum)
Your Plan: ? ?Try using your CPAP machine for 20 to 30 minutes in the evening to help improve tolerance ? ?Will follow up regarding need of overnight oxygen study to ensure satisfactory treatment of your low oxygen levels previously seen on your sleep study ? ?We will follow-up regarding possibility of decreasing max pressure ? ? ? ?Follow-up in 6 months or call earlier if needed ? ? ? ? ?Thank you for coming to see [place] at [facility]. I hope we have been able to provide you high quality care today. ? ?You may receive a patient satisfaction survey over the next few weeks. We would appreciate your feedback and comments so that we may continue to improve ourselves and the health of our patients. ? ?

## 2021-11-13 NOTE — Progress Notes (Signed)
Order for supply renewal has been sent to [facility]. Confirmation of fax received.  ?

## 2021-11-26 ENCOUNTER — Encounter: Payer: Self-pay | Admitting: Internal Medicine

## 2021-11-27 ENCOUNTER — Encounter: Payer: Self-pay | Admitting: Neurology

## 2021-11-28 ENCOUNTER — Other Ambulatory Visit: Payer: Self-pay | Admitting: Neurology

## 2021-11-28 DIAGNOSIS — G4730 Sleep apnea, unspecified: Secondary | ICD-10-CM

## 2021-11-28 DIAGNOSIS — G4734 Idiopathic sleep related nonobstructive alveolar hypoventilation: Secondary | ICD-10-CM

## 2021-11-28 DIAGNOSIS — G4733 Obstructive sleep apnea (adult) (pediatric): Secondary | ICD-10-CM

## 2021-11-28 DIAGNOSIS — R0683 Snoring: Secondary | ICD-10-CM

## 2021-12-01 DIAGNOSIS — G4733 Obstructive sleep apnea (adult) (pediatric): Secondary | ICD-10-CM | POA: Diagnosis not present

## 2021-12-02 ENCOUNTER — Encounter: Payer: Self-pay | Admitting: Neurology

## 2021-12-02 ENCOUNTER — Other Ambulatory Visit: Payer: Self-pay | Admitting: Internal Medicine

## 2021-12-07 DIAGNOSIS — G4734 Idiopathic sleep related nonobstructive alveolar hypoventilation: Secondary | ICD-10-CM | POA: Diagnosis not present

## 2021-12-09 ENCOUNTER — Telehealth: Payer: Self-pay

## 2021-12-09 NOTE — Telephone Encounter (Signed)
Received ONO report. Discussed with Janett Billow and she would like for Dr. Brett Fairy to review once she returns from vacation.  I have placed results in pod 1.

## 2021-12-11 ENCOUNTER — Encounter: Payer: Medicare HMO | Admitting: Adult Health

## 2021-12-12 ENCOUNTER — Encounter: Payer: Self-pay | Admitting: Adult Health

## 2021-12-15 NOTE — Telephone Encounter (Signed)
Patient completed an overnight oximetry test while wearing his CPAP.  The test was recorded for 7 hours 15 minutes and 38 seconds.  There was no concern of the oxygen level dropping below 89%.  Based off of this report there would be no need for adding oxygen to the CPAP machine.

## 2021-12-19 ENCOUNTER — Other Ambulatory Visit: Payer: Medicare HMO

## 2021-12-23 ENCOUNTER — Ambulatory Visit
Admission: RE | Admit: 2021-12-23 | Discharge: 2021-12-23 | Disposition: A | Discharge status: Home / Self Care | Payer: Medicare HMO | Source: Ambulatory Visit | Attending: Thoracic Surgery (Cardiothoracic Vascular Surgery) | Admitting: Thoracic Surgery (Cardiothoracic Vascular Surgery)

## 2021-12-23 DIAGNOSIS — I700 Atherosclerosis of aorta: Secondary | ICD-10-CM | POA: Diagnosis not present

## 2021-12-23 DIAGNOSIS — I2510 Atherosclerotic heart disease of native coronary artery without angina pectoris: Secondary | ICD-10-CM | POA: Diagnosis not present

## 2021-12-23 DIAGNOSIS — I7121 Aneurysm of the ascending aorta, without rupture: Secondary | ICD-10-CM

## 2021-12-23 DIAGNOSIS — I712 Thoracic aortic aneurysm, without rupture, unspecified: Secondary | ICD-10-CM | POA: Diagnosis not present

## 2021-12-23 MED ORDER — IOPAMIDOL (ISOVUE-300) INJECTION 61%
75.0000 mL | Freq: Once | INTRAVENOUS | Status: AC | PRN
  Administered 2021-12-23: 75 mL via INTRAVENOUS

## 2021-12-23 NOTE — Progress Notes (Unsigned)
[address]       [place] [address]             [phone]        PCP is Isaac Bliss, Rayford Halsted, MD Referring Provider is Isaac Bliss, Estel*  No chief complaint on file.   HPI: This is a 66 year old male with a past medical history of hypertension, former cigar use,anxiety, depression, coronary calcifications, and dyslipidemia who has been followed by our office for an ascending thoracic aortic aneurysm. He was last seen in December 2022 and the ATAA was 4.5 cm. Eugene Guzman continues to feel well.  He denies any new chest pain or shortness of breath.  He was recently diagnosed with sleep apnea and is having some trouble adjusting to using the CPAP machine.  Past Medical History:  Diagnosis Date   Anxiety    Aortic aneurysm (HCC)    Coronary artery calcification seen on CT scan    Depressed    Hypertension     Past Surgical History:  Procedure Laterality Date   TONSILLECTOMY  1961    Family History  Problem Relation Age of Onset   Hypertension Mother    Heart attack Father    Ovarian cancer Maternal Grandmother    Colon cancer Paternal Grandfather     Social History Social History   Tobacco Use   Smoking status: Former    Types: Cigars   Smokeless tobacco: Never  Substance Use Topics   Alcohol use: Yes    Alcohol/week: 10.0 standard drinks of alcohol    Types: 10 Cans of beer per week    Comment: beer occasionally   Drug use: No    Current Outpatient Medications  Medication Sig Dispense Refill   amLODipine (NORVASC) 2.5 MG tablet Take 0.5 tablets (1.25 mg total) by mouth daily. 15 tablet 0   ARIPiprazole (ABILIFY) 2 MG tablet Take 2 mg by mouth daily.     desvenlafaxine (PRISTIQ) 25 MG 24 hr tablet 3 tablets qd     ezetimibe (ZETIA) 10 MG tablet Take 1 tablet (10 mg total) by mouth daily. 90 tablet 3   finasteride (PROSCAR) 5 MG tablet Take 5 mg by mouth daily.     rosuvastatin (CRESTOR) 20 MG tablet Take 1 tablet (20 mg  total) by mouth daily. 90 tablet 3     Allergies  Allergen Reactions   Penicillins    Vital Signs: BP 149/85 Pulse 55 Respirations 20 SPO2 98% on room air   Physical Exam: CV-regular rate and rhythm, very soft systolic murmur Pulmonary-breath sounds are full, equal, and clear to auscultation Abdomen-soft nontender Extremities-well-perfused, no peripheral edema  Diagnostic Tests: CLINICAL DATA:  Follow-up ascending thoracic aortic dilatation   EXAM: CT ANGIOGRAPHY CHEST WITH CONTRAST   TECHNIQUE: Multidetector CT imaging of the chest was performed using the standard protocol during bolus administration of intravenous contrast. Multiplanar CT image reconstructions and MIPs were obtained to evaluate the vascular anatomy.   RADIATION DOSE REDUCTION: This exam was performed according to the departmental dose-optimization program which includes automated exposure control, adjustment of the mA and/or kV according to patient size and/or use of iterative reconstruction technique.   CONTRAST:  26m ISOVUE-300 IOPAMIDOL (ISOVUE-300) INJECTION 61%   COMPARISON:  CT 06/24/2021   FINDINGS: Cardiovascular: Measured at the same level (pulmonary outflow tract) and orientation (axial), the ascending thoracic aorta measures 4.5 x 4.5 cm (image 90/series 4) compared to 4.5 x 4.5.  Transverse arch is normal caliber with intimal calcification. Great vessels are normal. Descending thoracic aorta is normal caliber.   Coronary artery calcification and aortic atherosclerotic calcification.   Mediastinum/Nodes: No axillary or supraclavicular adenopathy. No mediastinal or hilar adenopathy. No pericardial fluid. Esophagus normal.   Lungs/Pleura: No suspicious pulmonary nodules. Normal pleural. Airways normal.   Upper Abdomen: Limited view of the liver, kidneys, pancreas are unremarkable. Normal adrenal glands.   Musculoskeletal: No aggressive osseous lesion.   Review of the MIP  images confirms the above findings.   IMPRESSION: 1. Stable dilatation of the ascending thoracic aorta at 4.5 cm. Ascending thoracic aortic aneurysm. Recommend semi-annual imaging followup by CTA or MRA and referral to cardiothoracic surgery if not already obtained. This recommendation follows 2010 ACCF/AHA/AATS/ACR/ASA/SCA/SCAI/SIR/STS/SVM Guidelines for the Diagnosis and Management of Patients With Thoracic Aortic Disease. Circulation. 2010; 121: C623-J628. Aortic aneurysm NOS (ICD10-I71.9) 2. Coronary artery calcification and Aortic Atherosclerosis (ICD10-I70.0).     Electronically Signed   By: Suzy Bouchard M.D.   On: 12/23/2021 11:41    ECHOCARDIOGRAM REPORT         Patient Name:   Eugene Guzman Date of Exam: 07/16/2021  Medical Rec #:  [id number]       Height:       70.0 in  Accession #:    [id number]      Weight:       166.0 lb  Date of Birth:  10/14/1955       BSA:          1.928 m  Patient Age:    29 years        BP:           145/79 mmHg  Patient Gender: M               HR:           65 bpm.  Exam Location:  [facility]   Procedure: 2D Echo, 3D Echo, Cardiac Doppler, Color Doppler and Strain  Analysis   Indications:    I35.9 AV calcification     History:        Patient has no prior history of Echocardiogram  examinations.                  CAD; Risk Factors:Hypertension, Dyslipidemia and Former  Smoker.                  Ascending aortic aneurysm.     Sonographer:    Basilia Jumbo BS, RDCS  Referring Phys: [facility]     1. Left ventricular ejection fraction, by estimation, is 60 to 65%. The  left ventricle has normal function. The left ventricle has no regional  wall motion abnormalities. Left ventricular diastolic parameters are  consistent with Grade I diastolic  dysfunction (impaired relaxation).   2. Right ventricular systolic function is normal. The right ventricular  size is normal.   3. Left atrial size was mildly  dilated.   4. The mitral valve is normal in structure. No evidence of mitral valve  regurgitation. No evidence of mitral stenosis.   5. The aortic valve is bicuspid. There is mild calcification of the  aortic valve. Aortic valve regurgitation is mild. Aortic valve  sclerosis/calcification is present, without any evidence of aortic stenosis.   6. Aortic dilatation noted. There is moderate dilatation of the aortic  root and of the ascending aorta, measuring 45 mm.   7. The  inferior vena cava is dilated in size with >50% respiratory  variability, suggesting right atrial pressure of 8 mmHg.   FINDINGS   Left Ventricle: Left ventricular ejection fraction, by estimation, is 60  to 65%. The left ventricle has normal function. The left ventricle has no  regional wall motion abnormalities. The left ventricular internal cavity  size was normal in size. There is   no left ventricular hypertrophy. Left ventricular diastolic parameters  are consistent with Grade I diastolic dysfunction (impaired relaxation).   Right Ventricle: The right ventricular size is normal. No increase in  right ventricular wall thickness. Right ventricular systolic function is  normal.   Left Atrium: Left atrial size was mildly dilated.   Right Atrium: Right atrial size was normal in size.   Pericardium: There is no evidence of pericardial effusion.   Mitral Valve: The mitral valve is normal in structure. No evidence of  mitral valve regurgitation. No evidence of mitral valve stenosis.   Tricuspid Valve: The tricuspid valve is normal in structure. Tricuspid  valve regurgitation is trivial. No evidence of tricuspid stenosis.   Aortic Valve: The aortic valve is bicuspid. There is mild calcification of  the aortic valve. Aortic valve regurgitation is mild. Aortic regurgitation  PHT measures 504 msec. Aortic valve sclerosis/calcification is present,  without any evidence of aortic  stenosis. Aortic valve mean gradient  measures 8.0 mmHg. Aortic valve peak  gradient measures 14.4 mmHg. Aortic valve area, by VTI measures 2.05 cm.   Pulmonic Valve: The pulmonic valve was normal in structure. Pulmonic valve  regurgitation is trivial. No evidence of pulmonic stenosis.   Aorta: The aortic root is normal in size and structure and aortic  dilatation noted. There is moderate dilatation of the aortic root and of  the ascending aorta, measuring 45 mm.   Venous: The inferior vena cava is dilated in size with greater than 50%  respiratory variability, suggesting right atrial pressure of 8 mmHg.   IAS/Shunts: No atrial level shunt detected by color flow Doppler.      LEFT VENTRICLE  PLAX 2D  LVIDd:         5.10 cm   Diastology  LVIDs:         3.40 cm   LV e' medial:    7.35 cm/s  LV PW:         0.80 cm   LV E/e' medial:  9.9  LV IVS:        1.10 cm   LV e' lateral:   11.00 cm/s  LVOT diam:     2.30 cm   LV E/e' lateral: 6.6  LV SV:         90  LV SV Index:   47        2D Longitudinal Strain  LVOT Area:     4.15 cm  2D Strain GLS (A2C):   -20.5 %                           2D Strain GLS (A3C):   -23.8 %                           2D Strain GLS (A4C):   -21.1 %                           2D Strain GLS Avg:     -  21.8 %                              3D Volume EF:                           3D EF:        57 %                           LV EDV:       136 ml                           LV ESV:       58 ml                           LV SV:        78 ml   RIGHT VENTRICLE             IVC  RV Basal diam:  3.10 cm     IVC diam: 2.10 cm  RV S prime:     14.80 cm/s  TAPSE (M-mode): 2.7 cm   LEFT ATRIUM             Index        RIGHT ATRIUM           Index  LA diam:        3.00 cm 1.56 cm/m   RA Pressure: 8.00 mmHg  LA Vol (A2C):   59.5 ml 30.85 ml/m  RA Area:     18.10 cm  LA Vol (A4C):   34.3 ml 17.79 ml/m  RA Volume:   48.80 ml  25.31 ml/m  LA Biplane Vol: 47.5 ml 24.63 ml/m   AORTIC VALVE  AV Area (Vmax):     2.02 cm  AV Area (Vmean):   2.06 cm  AV Area (VTI):     2.05 cm  AV Vmax:           190.00 cm/s  AV Vmean:          133.000 cm/s  AV VTI:            0.439 m  AV Peak Grad:      14.4 mmHg  AV Mean Grad:      8.0 mmHg  LVOT Vmax:         92.40 cm/s  LVOT Vmean:        66.100 cm/s  LVOT VTI:          0.217 m  LVOT/AV VTI ratio: 0.49  AI PHT:            504 msec     AORTA  Ao Root diam: 4.60 cm  Ao Asc diam:  4.50 cm   MITRAL VALVE               TRICUSPID VALVE                             Estimated RAP:  8.00 mmHg  MV Decel Time: 243 msec  MV E velocity: 73.10 cm/s  SHUNTS  MV A velocity: 75.90 cm/s  Systemic VTI:  0.22 m  MV E/A ratio:  0.96  Systemic Diam: 2.30 cm   Glori Bickers MD  Electronically signed by Glori Bickers MD  Signature Date/Time: 07/16/2021/5:18:32 PM       Impression and Plan:  Stable 4.5 cm ascending aortic aneurysm and recently discovered bicuspid aortic valve without aortic insufficiency or aortic stenosis.  He does have some mild aortic valve and annular sclerosis.  There is no indication for surgical intervention at this time.  Plan to continue with surveillance CTA in 6 months per standard guidelines and recommend periodic echocardiogram to follow-up on bicuspid aortic valve.   Enid Cutter, PA-C [facility] [phone]

## 2021-12-24 ENCOUNTER — Encounter: Payer: Self-pay | Admitting: Physician Assistant

## 2021-12-24 ENCOUNTER — Ambulatory Visit: Payer: Medicare HMO | Admitting: Physician Assistant

## 2021-12-24 VITALS — BP 149/85 | HR 55 | Resp 20 | Ht 70.0 in | Wt 172.0 lb

## 2021-12-24 DIAGNOSIS — Q231 Congenital insufficiency of aortic valve: Secondary | ICD-10-CM

## 2021-12-24 DIAGNOSIS — I7121 Aneurysm of the ascending aorta, without rupture: Secondary | ICD-10-CM

## 2021-12-24 DIAGNOSIS — I10 Essential (primary) hypertension: Secondary | ICD-10-CM | POA: Diagnosis not present

## 2021-12-24 NOTE — Patient Instructions (Signed)
Risk Modification in patient's with ascending thoracic aortic aneurysm:   Continue good control of blood pressure (prefer SBP 130/80 or less)-he monitors his blood pressure at home and maintains pretty good control on very low Dose Amlodipine   2. Avoid fluoroquinolone antibiotics (I.e Ciprofloxacin, Avelox, Levofloxacin, Ofloxacin)   3.  Use of statin (to decrease cardiovascular risk)-you should continue on Crestor and Zetia   4.  Exercise and activity limitations is individualized, but in general, contact sports are to be  avoided and one should avoid heavy lifting (defined as half of ideal body weight) and exercises involving sustained Valsalva maneuver.  5.Follow-up CTA and chest echocardiogram in 6 months.

## 2021-12-25 ENCOUNTER — Ambulatory Visit: Payer: Medicare HMO | Admitting: Neurology

## 2022-01-01 DIAGNOSIS — G4733 Obstructive sleep apnea (adult) (pediatric): Secondary | ICD-10-CM | POA: Diagnosis not present

## 2022-01-08 ENCOUNTER — Encounter: Payer: Self-pay | Admitting: Neurology

## 2022-01-08 ENCOUNTER — Telehealth: Payer: Self-pay | Admitting: Internal Medicine

## 2022-01-08 NOTE — Telephone Encounter (Signed)
Patient is upset about appt being out until October. Told patient that was the soonest that she had available. Patient refused to see NP or PA. Patient would like for Dr. Harrington Challenger or nurse to give them a call

## 2022-01-08 NOTE — Telephone Encounter (Signed)
Called the pt to advise of the opening on Monday 7/10. There was no answer LVM with this information asking the pt to call back or shoot a mychart message letting me know if he can accept this time.

## 2022-01-08 NOTE — Telephone Encounter (Signed)
Returned call to patient.  He would like for Dr. Harrington Challenger to call him to discuss whether or not he needs to come in for an appointment with her.  Dr. Harrington Challenger does not have any openings to schedule patient an appointment until 04/22/22 and patient states he does not want to wait that long.  Will forward to Dr. Harrington Challenger to review.

## 2022-01-12 ENCOUNTER — Encounter: Payer: Self-pay | Admitting: Neurology

## 2022-01-12 ENCOUNTER — Other Ambulatory Visit: Payer: Self-pay | Admitting: Internal Medicine

## 2022-01-12 ENCOUNTER — Ambulatory Visit: Payer: Medicare HMO | Admitting: Neurology

## 2022-01-12 VITALS — BP 161/72 | HR 62 | Ht 70.0 in | Wt 169.0 lb

## 2022-01-12 DIAGNOSIS — G4733 Obstructive sleep apnea (adult) (pediatric): Secondary | ICD-10-CM

## 2022-01-12 DIAGNOSIS — G4730 Sleep apnea, unspecified: Secondary | ICD-10-CM | POA: Diagnosis not present

## 2022-01-12 DIAGNOSIS — I2510 Atherosclerotic heart disease of native coronary artery without angina pectoris: Secondary | ICD-10-CM

## 2022-01-12 DIAGNOSIS — G4734 Idiopathic sleep related nonobstructive alveolar hypoventilation: Secondary | ICD-10-CM | POA: Diagnosis not present

## 2022-01-12 MED ORDER — ALPRAZOLAM 0.25 MG PO TABS: 0.2500 mg | ORAL_TABLET | Freq: Every evening | ORAL | 0 refills | Status: DC | PRN

## 2022-01-12 NOTE — Progress Notes (Signed)
SLEEP MEDICINE CLINIC    Provider:  Larey Seat, MD  Primary Care Physician:  Eugene Guzman, Eugene Halsted, MD [place] [address]     Referring Provider: Isaac Guzman, Eugene Guzman, [place],  [place] [address]          Chief Complaint according to patient   Patient presents with:     New Patient (Initial Visit)      Pt referred by Dr. Isaac Guzman for possible OSA. Marland Kitchen       HISTORY OF PRESENT ILLNESS:  Eugene Guzman is a 66 y.o. Caucasian male patient seen here in a RV on 01/12/2022 01-12-2022. The patient the patient underwent a home sleep test multivitamin cast on cast on March September 10, 2018 was September 09, 2021 which documented severe sleep apnea not REM sleep dependent but REM sleep dependent but with hypoxia. The mean oxygen saturation was 90% with a minimum of 71% saturation 470 357 minutes the patient was presenting with low oxygen at night.  This was severe hypoxia.  Supine sleep accentuated the apnea the patient had much less apnea when sleeping on his left side.  There was no REM sleep accentuation as he was fitted for a CPAP device 5 through 20 cm 2 cm EPR and had already 1 follow-up with Eugene Guzman here in the sleep clinic.  At the time he felt that CPAP is not helping him and that he is actually more fatigued and more sleepy.  He had just found a good median between Abilify and his main antidepressant when he started on CPAP and he feels now that this gain has been eradicated.  He has not grown to like his CPAP.  He has made wonderful effort.  He has used the machine 87% of the last 30 days average user time 4 hours 46 minutes his AHI is down to 2.0 so numerically the apnea machine works well for him he only needed the 95th percentile pressure of 10.7 cm water but he is reports that this time of use is sleep time;  He is using the machine until he finally can fall asleep and as soon as 4 hours are over he is happy to  report off.  He wakes more fatigued, more sleepy. I want to invite him for an lin lab study, qualifying him for CPAP or BiPAP and is using nasal cradle and pillows.  He is not wanting to go through in lab study, due to panic and claustrophobia.  He is willing to repeat a SLEEP STUDY HST to verify his baseline.  Making sure the hypoxia is true.              from PCP for a  sleep consult.  Chief concern according to patient: " I snored all my life, even in college. My wife sleeps through it, but reports witnessing apnea . And I am a mouth breather.  I had tonsils and adenoids taken at age 21, and that was done to help me breathe, I wore braces".    I have the pleasure of seeing Eugene Guzman on 1=17-2023,  a right-handed Caucasian male with a possible sleep disorder.  He has a past medical history of Health Anxiety, Aortic Thoracic Aneurysm ([place]), Coronary artery calcification seen on CT scan, Depressed, and Hypertension.Abnormal calcium score and abnormal stress test.   Eugene Guzman returns for follow-up of his aortic root and ascending aneurysm. Eugene Guzman is a 66 year old man with a history of anxiety, depression, coronary calcification on CT, dyslipidemia, and borderline hypertension.  He does have a family history of CAD as well.  He had a CT for coronary calcium score done in 2021.  He had a lot of calcium in his coronaries but a stress test showed no evidence of ischemia.  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      Sleep relevant medical history: Nocturia 2-4 times - BPH, Prostate related. Urge incontinence. Rib fracture.  Sleep walking in childhood,  Tonsillectomy yes, no deviated septum repair, no UPPP? all his live depressed, hypothymia.  Family medical /sleep history: no other family member on CPAP with OSA, insomnia, sleep walkers.  Father died at age 50.    Social history:  [place], smoked in HS. Patient is working as Optometrist,  still part-time- and lives in a household with  spouse, no pets. Quit 2016 , cigarettes.  The patient currently works part time . Tobacco use: .former smoker   ETOH use: 2 beers a night , Caffeine intake in form of Coffee( /) Soda( /) Tea ( lunchtime 3-4 glasses / total of 36 ounces) . Regular exercise in form of yard work.   Hobbies :woodworking- Farm work.   Sleep habits are as follows: The patient's dinner time is between 5-6 PM.  The patient goes to bed at  12-2 AM, but falls asleep on the couch , watching TV.   and continues to sleep for 6 hours, wakes for many bathroom breaks.   The preferred sleep position is supine , with the support of 1-2 pillows. Shoulder pain on the right.  Dreams are reportedly frequent/vivid, not pleasant, night mares.  No PTSD.   9 AM is the usual rise time. The patient wakes up spontaneously at 8 AM. This is new- he used to be a later sleeper.  He reports not feeling refreshed or restored in AM, with symptoms such as morning headaches, and residual fatigue.  Naps are taken infrequently, not needing on Abilify.  Allowing for better nocturnal sleep.    Review of Systems: Out of a complete 14 system review, the patient complains of only the following symptoms, and all other reviewed systems are negative.:  Fatigue, sleepiness , snoring, fragmented sleep, NOCTURIA  Panic attacks,  Claustrophobia/ Worried.     How likely are you to doze in the following situations: 0 = not likely, 1 = slight chance, 2 = moderate chance, 3 = high chance   Sitting and Reading? Watching Television? Sitting inactive in a public place (theater or meeting)? As a passenger in a car for an hour without a break? Lying down in the afternoon when circumstances permit? Sitting and talking to someone? Sitting quietly after lunch without alcohol? In a car, while stopped for a few minutes in traffic?   Total =up to 9 points on CPAP from  4/ 24 points   FSS endorsed at 38/ 63  points.   Social History   Socioeconomic History   Marital status: Married    Spouse name: Eugene Guzman   Number of children: 0   Years of education: Not on file   Highest education level: Bachelor's degree (e.g., BA, AB, BS)  Occupational History   Not on file  Tobacco Use   Smoking status: Former    Types: Cigars   Smokeless tobacco: Never  Substance and Sexual Activity   Alcohol use: Yes  Alcohol/week: 10.0 standard drinks of alcohol    Types: 10 Cans of beer per week    Comment: beer occasionally   Drug use: No   Sexual activity: Not on file  Other Topics Concern   Not on file  Social History Narrative   Lives with wife   Right handed   Caffeine: ice tea daily   Social Determinants of Health   Financial Resource Strain: Low Risk  (09/18/2021)   Overall Financial Resource Strain (CARDIA)    Difficulty of Paying Living Expenses: Not hard at all  Food Insecurity: No Food Insecurity (09/18/2021)   Hunger Vital Sign    Worried About Running Out of Food in the Last Year: Never true    Ran Out of Food in the Last Year: Never true  Transportation Needs: No Transportation Needs (09/18/2021)   PRAPARE - Hydrologist (Medical): No    Lack of Transportation (Non-Medical): No  Physical Activity: Sufficiently Active (09/18/2021)   Exercise Vital Sign    Days of Exercise per Week: 3 days    Minutes of Exercise per Session: 120 min  Stress: Stress Concern Present (09/18/2021)   [place]    Feeling of Stress : To some extent  Social Connections: Moderately Isolated (09/18/2021)   Social Connection and Isolation Panel [NHANES]    Frequency of Communication with Friends and Family: More than three times a week    Frequency of Social Gatherings with Friends and Family: More than three times a week    Attends Religious Services: Never    Marine scientist or Organizations: No    Attends  Music therapist: Not on file    Marital Status: Married    Family History  Problem Relation Age of Onset   Hypertension Mother    Heart attack Father    Ovarian cancer Maternal Grandmother    Colon cancer Paternal Grandfather     Past Medical History:  Diagnosis Date   Anxiety    Aortic aneurysm ([place])    Coronary artery calcification seen on CT scan    Depressed    Hypertension     Past Surgical History:  Procedure Laterality Date   TONSILLECTOMY  1961     Current Outpatient Medications on File Prior to Visit  Medication Sig Dispense Refill   amLODipine (NORVASC) 2.5 MG tablet Take 0.5 tablets (1.25 mg total) by mouth daily. 15 tablet 0   ARIPiprazole (ABILIFY) 2 MG tablet Take 2 mg by mouth daily.     desvenlafaxine (PRISTIQ) 25 MG 24 hr tablet 3 tablets qd     ezetimibe (ZETIA) 10 MG tablet Take 1 tablet (10 mg total) by mouth daily. 90 tablet 3   finasteride (PROSCAR) 5 MG tablet Take 5 mg by mouth daily.     multivitamin (ONE-A-DAY MEN'S) TABS tablet 1 tablet.     rosuvastatin (CRESTOR) 20 MG tablet Take 1 tablet (20 mg total) by mouth daily. 90 tablet 3   No current facility-administered medications on file prior to visit.    Allergies  Allergen Reactions   Penicillins     Physical exam:  Today's Vitals   01/12/22 1437  BP: (!) 161/72  Pulse: 62  Weight: 169 lb (76.7 kg)  Height: '5\' 10"'$  (1.778 m)   Body mass index is 24.25 kg/m.   Wt Readings from Last 3 Encounters:  01/12/22 169 lb (76.7 kg)  12/24/21 172 lb (  78 kg)  11/13/21 170 lb (77.1 kg)     Ht Readings from Last 3 Encounters:  01/12/22 '5\' 10"'$  (1.778 m)  12/24/21 '5\' 10"'$  (1.778 m)  11/13/21 '5\' 10"'$  (1.778 m)      General: The patient is awake, alert and appears not in acute distress. The patient is well groomed. Head: Normocephalic, atraumatic. Neck is supple.  Mallampati 3,  neck circumference:16.5 inches. Nasal airflow patent.  Retrognathia is seen.  Small lower jaw   Dental status:  Cardiovascular:  Regular rate and cardiac rhythm by pulse, without distended neck veins. Respiratory: Lungs are clear to auscultation.  Skin:  Without evidence of ankle edema, or rash. Trunk: The patient's posture is erect.   Neurologic exam : The patient is awake and alert, oriented to place and time.   Memory subjective described as intact.  Attention span & concentration ability appears normal.  Speech is fluent,  without  dysarthria, dysphonia or aphasia.  Mood and affect are appropriate.   Cranial nerves: no loss of smell or taste reported  Pupils are equal and briskly reactive to light. Funduscopic exam deferred.  Extraocular movements in vertical and horizontal planes were intact and without nystagmus. No Diplopia. Visual fields by finger perimetry are intact. Hearing was intact to tuning fork.  Facial sensation intact to fine touch. Facial motor strength is symmetric and tongue and uvula move midline.  Neck ROM : rotation, tilt and flexion extension were normal for age and shoulder shrug was symmetrical.    Motor exam:  Symmetric bulk, tone and ROM.   Normal tone without cog -wheeling, symmetric grip strength .   Sensory:  Fine touch, pinprick and vibration were tested  and  normal.  Proprioception tested in the upper extremities was normal.   Coordination: Rapid alternating movements in the fingers/hands were of normal speed.  The Finger-to-nose maneuver was intact without evidence of ataxia, dysmetria or tremor.   Gait and station: Patient could rise unassisted from a seated position, walked without assistive device.  Stance is of normal width/ base and the patient turned with 3 steps.  Toe and heel walk were deferred.  Deep tendon reflexes: in the upper and lower extremities are symmetric and intact.  Babinski response was normal        After spending a total time of  25  minutes face to face and additional time for physical and neurologic  examination, review of laboratory studies,  personal review of imaging studies, reports and results of other testing and review of referral information / records as far as provided in visit, I have established the following assessments:  Intolerant of CPAP.   Medication reviewed, recent labs reviewed, low Vit D diagnosed,   He had just found a good median between Abilify and his main antidepressant when he started on CPAP and he feels now that this gain has been eradicated.  He has not grown to like his CPAP.  He has made wonderful effort.  He has used the machine 87% of the last 30 days average user time 4 hours 46 minutes his AHI is down to 2.0 so numerically the apnea machine works well for him he only needed the 95th percentile pressure of 10.7 cm water but he is reports that this time of use is sleep time;  He is using the machine until he finally can fall asleep and as soon as 4 hours are over he is happy to report off.  He wakes more fatigued, more sleepy.  I want to invite him for an lin lab study, qualifying him for CPAP or BiPAP and is using nasal cradle and pillows.  He is not wanting to go through in lab study, due to panic and claustrophobia.  He is willing to repeat a SLEEP STUDY HST to verify his baseline.  Making sure the hypoxia is true.   Reduce alcohol : one can per evening of light beer.     My Plan is to proceed with:  1) repeated  HST  or just ONO -  this time to confirm hypoxia is even present without CPAP.  Offered 0.5 mg xanax if needed.   I would like to thank Eugene Guzman, Eugene Halsted, MD and Eugene Guzman, Eugene Guzman, [address] [place],  [place] [address] for allowing me to meet with and to take care of this pleasant patient.     I plan to follow up  through our NP within 2-4 months after the HST repeat .    Electronically signed by: Eugene Seat, MD 01/12/2022 3:27 PM  [facility] Board certified by  The [employer] of Sleep Medicine and Diplomate of the [employer] of Sleep Medicine. Board certified In Neurology through the [place], Fellow of the [employer] of Neurology. Medical Director of [facility].

## 2022-01-12 NOTE — Patient Instructions (Signed)

## 2022-01-13 NOTE — Telephone Encounter (Signed)
Spoke with the pt and made him an appt for 02/10/22.

## 2022-01-15 ENCOUNTER — Telehealth: Payer: Self-pay | Admitting: Internal Medicine

## 2022-01-15 DIAGNOSIS — I2510 Atherosclerotic heart disease of native coronary artery without angina pectoris: Secondary | ICD-10-CM

## 2022-01-15 DIAGNOSIS — E785 Hyperlipidemia, unspecified: Secondary | ICD-10-CM

## 2022-01-15 DIAGNOSIS — R7989 Other specified abnormal findings of blood chemistry: Secondary | ICD-10-CM

## 2022-01-15 DIAGNOSIS — Z79899 Other long term (current) drug therapy: Secondary | ICD-10-CM

## 2022-01-15 DIAGNOSIS — R5383 Other fatigue: Secondary | ICD-10-CM

## 2022-01-15 DIAGNOSIS — I10 Essential (primary) hypertension: Secondary | ICD-10-CM

## 2022-01-15 NOTE — Telephone Encounter (Signed)
Pt seeing dr Harrington Challenger 02/10/22 will My Chart him and ask when he can come and get labs prior.

## 2022-01-15 NOTE — Telephone Encounter (Signed)
Error

## 2022-01-15 NOTE — Addendum Note (Signed)
Addended by: Stephani Police on: 01/15/2022 04:30 PM   Modules accepted: Orders

## 2022-01-15 NOTE — Telephone Encounter (Signed)
Patient called in   Feeling fatigued    I should see him in clinic   ? If 1/2 day adding in  or in Aug Needs labs    CBC, TSH, CMET and lipomed panel On way back into town   [place] have before I see him

## 2022-01-19 ENCOUNTER — Other Ambulatory Visit: Payer: Medicare HMO

## 2022-01-19 DIAGNOSIS — Z79899 Other long term (current) drug therapy: Secondary | ICD-10-CM

## 2022-01-19 DIAGNOSIS — E785 Hyperlipidemia, unspecified: Secondary | ICD-10-CM | POA: Diagnosis not present

## 2022-01-19 DIAGNOSIS — I2510 Atherosclerotic heart disease of native coronary artery without angina pectoris: Secondary | ICD-10-CM

## 2022-01-19 DIAGNOSIS — I10 Essential (primary) hypertension: Secondary | ICD-10-CM | POA: Diagnosis not present

## 2022-01-19 DIAGNOSIS — R7989 Other specified abnormal findings of blood chemistry: Secondary | ICD-10-CM

## 2022-01-19 DIAGNOSIS — R5383 Other fatigue: Secondary | ICD-10-CM | POA: Diagnosis not present

## 2022-01-20 DIAGNOSIS — F331 Major depressive disorder, recurrent, moderate: Secondary | ICD-10-CM | POA: Diagnosis not present

## 2022-01-20 DIAGNOSIS — F410 Panic disorder [episodic paroxysmal anxiety] without agoraphobia: Secondary | ICD-10-CM | POA: Diagnosis not present

## 2022-01-20 DIAGNOSIS — R69 Illness, unspecified: Secondary | ICD-10-CM | POA: Diagnosis not present

## 2022-01-20 LAB — CBC
Hematocrit: 41.9 % (ref 37.5–51.0)
Hemoglobin: 14.2 g/dL (ref 13.0–17.7)
MCH: 31.1 pg (ref 26.6–33.0)
MCHC: 33.9 g/dL (ref 31.5–35.7)
MCV: 92 fL (ref 79–97)
Platelets: 163 10*3/uL (ref 150–450)
RBC: 4.57 x10E6/uL (ref 4.14–5.80)
RDW: 13 % (ref 11.6–15.4)
WBC: 9.6 10*3/uL (ref 3.4–10.8)

## 2022-01-20 LAB — COMPREHENSIVE METABOLIC PANEL
ALT: 29 IU/L (ref 0–44)
AST: 28 IU/L (ref 0–40)
Albumin/Globulin Ratio: 2.3 — ABNORMAL HIGH (ref 1.2–2.2)
Albumin: 4.6 g/dL (ref 3.9–4.9)
Alkaline Phosphatase: 81 IU/L (ref 44–121)
BUN/Creatinine Ratio: 16 (ref 10–24)
BUN: 14 mg/dL (ref 8–27)
Bilirubin Total: 0.4 mg/dL (ref 0.0–1.2)
CO2: 22 mmol/L (ref 20–29)
Calcium: 9.5 mg/dL (ref 8.6–10.2)
Chloride: 105 mmol/L (ref 96–106)
Creatinine, Ser: 0.9 mg/dL (ref 0.76–1.27)
Globulin, Total: 2 g/dL (ref 1.5–4.5)
Glucose: 101 mg/dL — ABNORMAL HIGH (ref 70–99)
Potassium: 4.4 mmol/L (ref 3.5–5.2)
Sodium: 142 mmol/L (ref 134–144)
Total Protein: 6.6 g/dL (ref 6.0–8.5)
eGFR: 93 mL/min/{1.73_m2} (ref >59)

## 2022-01-20 LAB — NMR, LIPOPROFILE
Cholesterol, Total: 135 mg/dL (ref 100–199)
HDL Particle Number: 49.2 umol/L (ref >=30.5)
HDL-C: 74 mg/dL (ref >39)
LDL Particle Number: 693 nmol/L (ref <1000)
LDL Size: 19.8 nm — ABNORMAL LOW (ref >20.5)
LDL-C (NIH Calc): 49 mg/dL (ref 0–99)
LP-IR Score: 31 (ref <=45)
Small LDL Particle Number: 459 nmol/L (ref <=527)
Triglycerides: 55 mg/dL (ref 0–149)

## 2022-01-20 LAB — APOLIPOPROTEIN B: Apolipoprotein B: 50 mg/dL (ref <90)

## 2022-01-20 LAB — LIPOPROTEIN A (LPA): Lipoprotein (a): <8.4 nmol/L (ref <75.0)

## 2022-01-20 LAB — TSH: TSH: 2.36 u[IU]/mL (ref 0.450–4.500)

## 2022-01-21 DIAGNOSIS — F331 Major depressive disorder, recurrent, moderate: Secondary | ICD-10-CM | POA: Diagnosis not present

## 2022-01-21 DIAGNOSIS — R69 Illness, unspecified: Secondary | ICD-10-CM | POA: Diagnosis not present

## 2022-01-21 DIAGNOSIS — F410 Panic disorder [episodic paroxysmal anxiety] without agoraphobia: Secondary | ICD-10-CM | POA: Diagnosis not present

## 2022-01-23 ENCOUNTER — Encounter: Payer: Self-pay | Admitting: Internal Medicine

## 2022-01-29 ENCOUNTER — Other Ambulatory Visit: Payer: Self-pay | Admitting: Internal Medicine

## 2022-01-31 DIAGNOSIS — G4733 Obstructive sleep apnea (adult) (pediatric): Secondary | ICD-10-CM | POA: Diagnosis not present

## 2022-02-02 ENCOUNTER — Encounter: Payer: Self-pay | Admitting: Neurology

## 2022-02-02 ENCOUNTER — Other Ambulatory Visit: Payer: Self-pay | Admitting: Internal Medicine

## 2022-02-03 NOTE — Telephone Encounter (Signed)
HST- [employer] medicare no auth req spoke to [facility] ref # [id number].  Patient is scheduled at [facility] for 02/17/22 at 11:00 AM.  Mailed packet to the patient.

## 2022-02-10 ENCOUNTER — Encounter: Payer: Self-pay | Admitting: Internal Medicine

## 2022-02-10 ENCOUNTER — Ambulatory Visit: Payer: Medicare HMO | Admitting: Internal Medicine

## 2022-02-10 VITALS — BP 130/72 | HR 54 | Ht 70.0 in | Wt 167.2 lb

## 2022-02-10 DIAGNOSIS — I10 Essential (primary) hypertension: Secondary | ICD-10-CM

## 2022-02-10 DIAGNOSIS — I2510 Atherosclerotic heart disease of native coronary artery without angina pectoris: Secondary | ICD-10-CM

## 2022-02-10 DIAGNOSIS — E785 Hyperlipidemia, unspecified: Secondary | ICD-10-CM

## 2022-02-10 DIAGNOSIS — R5383 Other fatigue: Secondary | ICD-10-CM

## 2022-02-10 MED ORDER — AMLODIPINE BESYLATE 2.5 MG PO TABS: 2.5000 mg | ORAL_TABLET | Freq: Every day | ORAL | 3 refills | Status: DC

## 2022-02-10 MED ORDER — ASPIRIN 81 MG PO TBEC: 81.0000 mg | DELAYED_RELEASE_TABLET | Freq: Every day | ORAL | 3 refills | Status: AC

## 2022-02-10 NOTE — Patient Instructions (Signed)
Medication Instructions:  Start Aspirin 81 mg daily Take Amlodipine 2.5 mg daily   *If you need a refill on your cardiac medications before your next appointment, please call your pharmacy*   Lab Work:  If you have labs (blood work) drawn today and your tests are completely normal, you will receive your results only by: [place] (if you have MyChart) OR A paper copy in the mail If you have any lab test that is abnormal or we need to change your treatment, we will call you to review the results.   Testing/Procedures: How to Prepare for Your Cardiac PET/CT Stress Test:  1. Please do not take these medications before your test:   Medications that may interfere with the cardiac pharmacological stress agent (ex. nitrates - including erectile dysfunction medications or beta-blockers) the day of the exam. (Erectile dysfunction medication should be held for at least 72 hrs prior to test) Your remaining medications may be taken with water.  2. Nothing to eat or drink, except water, 3 hours prior to arrival time.   NO caffeine/decaffeinated products, or chocolate 12 hours prior to arrival.  3. NO perfume, cologne or lotion  4. Total time is 1 to 2 hours; you may want to bring reading material for the waiting time.  5. Please report to Admitting at the [facility] Entrance 60 minutes early for your test.  [place], [place] [address]   In preparation for your appointment, medication and supplies will be purchased.  Appointment availability is limited, so if you need to cancel or reschedule, please call the Radiology Department at [phone]  24 hours in advance to avoid a cancellation fee of $100.00  What to Expect After you Arrive:  Once you arrive and check in for your appointment, you will be taken to a preparation room within the Radiology Department.  A technologist or Nurse will obtain your medical history, verify that you are correctly prepped  for the exam, and explain the procedure.  Afterwards,  an IV will be started in your arm and electrodes will be placed on your skin for EKG monitoring during the stress portion of the exam. Then you will be escorted to the PET/CT scanner.  There, staff will get you positioned on the scanner and obtain a blood pressure and EKG.  During the exam, you will continue to be connected to the EKG and blood pressure machines.  A small, safe amount of a radioactive tracer will be injected in your IV to obtain a series of pictures of your heart along with an injection of a stress agent.    After your Exam:  It is recommended that you eat a meal and drink a caffeinated beverage to counter act any effects of the stress agent.  Drink plenty of fluids for the remainder of the day and urinate frequently for the first couple of hours after the exam.  Your doctor will inform you of your test results within 7-10 business days.  For questions about your test or how to prepare for your test, please call: Marchia Bond, Cardiac Imaging Nurse Navigator  Gordy Clement, Cardiac Imaging Nurse Navigator Office: [phone]    Follow-Up: At [facility], you and your health needs are our priority.  As part of our continuing mission to provide you with exceptional heart care, we have created designated Provider Care Teams.  These Care Teams include your primary Cardiologist (physician) and Advanced Practice Providers (APPs -  Physician Assistants  and Nurse Practitioners) who all work together to provide you with the care you need, when you need it.  We recommend signing up for the patient portal called "MyChart".  Sign up information is provided on this After Visit Summary.  MyChart is used to connect with patients for Virtual Visits (Telemedicine).  Patients are able to view lab/test results, encounter notes, upcoming appointments, etc.  Non-urgent messages can be sent to your provider as well.   To learn more about what  you can do with MyChart, go to [email or web address].     Important Information About Sugar      Take Amlo

## 2022-02-10 NOTE — Progress Notes (Signed)
Cardiology Office Note   Date:  02/10/2022   ID:  Eugene Guzman, DOB 07/28/1955, MRN [id number]  PCP:  Eugene Guzman, Rayford Halsted, MD  Cardiologist:   Dorris Carnes, MD   Patient presents for follow up of CAD    History of Present Illness: Eugene Guzman is a 66 y.o. male  CAD.  Pt has a calcium score CT in 2021  score was 2118 (97th percentile for age)   Calcificatons noted in all coronary arteries.   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   he has been followed by Eugene Guzman with serial CT exams    The pt is also followed by psych for anxiety, depression  The pt returns today with his wife   he denies CP   Says when he does chores he does not get CP   He does not have to slow down   Denies DOE   His biggest complaint is that he has no energy to start.   Once started though he says he feels OK      Current Meds  Medication Sig   ALPRAZolam (XANAX) 0.25 MG tablet Take 1 tablet (0.25 mg total) by mouth at bedtime as needed for anxiety.   amLODipine (NORVASC) 2.5 MG tablet Take 0.5 tablets (1.25 mg total) by mouth daily.   ARIPiprazole (ABILIFY) 2 MG tablet Take 2 mg by mouth daily.   desvenlafaxine (PRISTIQ) 25 MG 24 hr tablet 3 tablets qd   ezetimibe (ZETIA) 10 MG tablet Take 1 tablet (10 mg total) by mouth daily.   finasteride (PROSCAR) 5 MG tablet Take 5 mg by mouth daily.   multivitamin (ONE-A-DAY MEN'S) TABS tablet 1 tablet.   rosuvastatin (CRESTOR) 20 MG tablet Take 1 tablet (20 mg total) by mouth daily.     Allergies:   Penicillins   Past Medical History:  Diagnosis Date   Anxiety    Aortic aneurysm ([place])    Coronary artery calcification seen on CT scan    Depressed    Hypertension     Past Surgical History:  Procedure Laterality Date   TONSILLECTOMY  1961     Social History:  The patient  reports that he has quit smoking. His smoking use included cigars.  He has never used smokeless tobacco. He reports current alcohol use of about 10.0 standard drinks of alcohol per week. He reports that he does not use drugs.   Family History:  The patient's family history includes Colon cancer in his paternal grandfather; Heart attack in his father; Hypertension in his mother; Ovarian cancer in his maternal grandmother.    ROS:  Please see the history of present illness. All other systems are reviewed and  Negative to the above problem except as noted.    PHYSICAL EXAM: VS:  BP 130/72   Pulse (!) 54   Ht '5\' 10"'$  (1.778 m)   Wt 167 lb 3.2 oz (75.8 kg)   SpO2 96%   BMI 23.99 kg/m   GEN: Well nourished, well developed, in no acute distress  HEENT: normal  Neck: no JVD, no carotid bruit Cardiac: RRR; no murmur  No LE edema  Respiratory:  clear to auscultation bilaterally GI: soft, nontender, nondistended, + BS  No hepatomegaly  MS: no deformity Moving all extremities   Skin: warm and dry, no rash  Neuro:  Strength and sensation are intact Psych: euthymic mood, full affect   EKG:  EKG is ordered today.  SB 54 bpm   Occcasional PVC     Lipid Panel   Last lipds in May LDL 134  HDL 67  Trig 141   LDL in 2010 was 172      Component Value Date/Time   CHOL 166 05/06/2021 1136   CHOL 160 03/04/2021 1117   TRIG 52.0 05/06/2021 1136   HDL 83.40 05/06/2021 1136   HDL 92 03/04/2021 1117   CHOLHDL 2 05/06/2021 1136   VLDL 10.4 05/06/2021 1136   LDLCALC 72 05/06/2021 1136   LDLCALC 54 03/04/2021 1117      Wt Readings from Last 3 Encounters:  02/10/22 167 lb 3.2 oz (75.8 kg)  01/12/22 169 lb (76.7 kg)  12/24/21 172 lb (78 kg)      ASSESSMENT AND PLAN:   1  CAD   Pt with extensive coronary calcifications on CT   Negative myoview in 2021   Denies CP but overall lacks energy  When he gets going he says he feels OK I am not convinced of ischemia but would recomm a PET /CT to eval for ischemia/low flow        Take ASA 81 mg daily     2    Thoracic aneurysm   Follow with S Hendrickson  3  HTN   BP is a little high   Take whole amlodipine  2.5 mg   Goal 110s t o130/     4  HL    Excellent control  LDL 49  HDL 74   Continue meds      Current medicines are reviewed at length with the patient today.  The patient does not have concerns regarding medicines.  Signed, Dorris Carnes, MD  02/10/2022 10:11 AM    [facility] [place], [place], [place]  [address] Phone: [phone]; Fax: [phone]

## 2022-02-11 DIAGNOSIS — R972 Elevated prostate specific antigen [PSA]: Secondary | ICD-10-CM | POA: Diagnosis not present

## 2022-02-12 ENCOUNTER — Other Ambulatory Visit: Payer: Self-pay | Admitting: Internal Medicine

## 2022-02-17 ENCOUNTER — Ambulatory Visit: Payer: Medicare HMO | Admitting: Neurology

## 2022-02-17 DIAGNOSIS — G4730 Sleep apnea, unspecified: Secondary | ICD-10-CM

## 2022-02-17 DIAGNOSIS — G4733 Obstructive sleep apnea (adult) (pediatric): Secondary | ICD-10-CM

## 2022-02-17 DIAGNOSIS — R972 Elevated prostate specific antigen [PSA]: Secondary | ICD-10-CM | POA: Diagnosis not present

## 2022-02-17 DIAGNOSIS — I2510 Atherosclerotic heart disease of native coronary artery without angina pectoris: Secondary | ICD-10-CM

## 2022-02-17 DIAGNOSIS — R3915 Urgency of urination: Secondary | ICD-10-CM | POA: Diagnosis not present

## 2022-02-17 DIAGNOSIS — G4734 Idiopathic sleep related nonobstructive alveolar hypoventilation: Secondary | ICD-10-CM

## 2022-02-19 ENCOUNTER — Encounter: Payer: Self-pay | Admitting: Internal Medicine

## 2022-02-19 ENCOUNTER — Ambulatory Visit (INDEPENDENT_AMBULATORY_CARE_PROVIDER_SITE_OTHER): Payer: Medicare HMO | Admitting: Internal Medicine

## 2022-02-19 VITALS — BP 120/70 | HR 59 | Temp 98.0°F | Wt 167.6 lb

## 2022-02-19 DIAGNOSIS — G4730 Sleep apnea, unspecified: Secondary | ICD-10-CM | POA: Insufficient documentation

## 2022-02-19 DIAGNOSIS — R5383 Other fatigue: Secondary | ICD-10-CM | POA: Diagnosis not present

## 2022-02-19 LAB — POCT GLYCOSYLATED HEMOGLOBIN (HGB A1C): Hemoglobin A1C: 5.5 % (ref 4.0–5.6)

## 2022-02-19 NOTE — Progress Notes (Signed)
First- I have to excuse for the stuttering text in the clinical information-  The result of this HST was different in many ways- no significant hypoxia- and positional but not REM sleep dependent apnea.  RECOMMENDATION:If CPAP is not tolerated, he has a better chance of apnea control by avoiding supine sleep- may be by using a tennis ball or Phillips sleep balance belt.    No prolonged hypoxia was noted either- meaning the patient could entertain an inspire device for treatment.

## 2022-02-19 NOTE — Progress Notes (Signed)
Established Patient Office Visit     CC/Reason for Visit: Discuss fatigue  HPI: Eugene Guzman is a 66 y.o. male who is coming in today for the above mentioned reasons. Past Medical History is significant for: Hypertension, hyperlipidemia, coronary artery disease, ascending thoracic aortic aneurysm, severe mood disorder with depression and anxiety.  He is here today to discuss fatigue.  He was diagnosed with severe sleep apnea earlier this year but has not had a good experience with CPAP.  He is seeing Dr. Brett Fairy and apparently there is a plan to repeat sleep test soon.  He also saw his cardiologist last week.  And it would appear that a nuclear medicine scan has been ordered to rule out ischemia as a cause.  He also recently had CBC, CMP, thyroid and vitamin B12 levels that were normal.  No recent URIs.  Last time his psychotropic medications were changed was at the beginning of the year.   Past Medical/Surgical History: Past Medical History:  Diagnosis Date   Anxiety    Aortic aneurysm ([place])    Coronary artery calcification seen on CT scan    Depressed    Hypertension     Past Surgical History:  Procedure Laterality Date   TONSILLECTOMY  1961    Social History:  reports that he has quit smoking. His smoking use included cigars. He has never used smokeless tobacco. He reports current alcohol use of about 10.0 standard drinks of alcohol per week. He reports that he does not use drugs.  Allergies: Allergies  Allergen Reactions   Penicillins     Family History:  Family History  Problem Relation Age of Onset   Hypertension Mother    Heart attack Father    Ovarian cancer Maternal Grandmother    Colon cancer Paternal Grandfather      Current Outpatient Medications:    ALPRAZolam (XANAX) 0.25 MG tablet, Take 1 tablet (0.25 mg total) by mouth at bedtime as needed for anxiety., Disp: 10 tablet, Rfl: 0   amLODipine (NORVASC) 2.5 MG tablet, Take 1 tablet (2.5 mg total)  by mouth daily., Disp: 90 tablet, Rfl: 3   ARIPiprazole (ABILIFY) 2 MG tablet, Take 2 mg by mouth daily., Disp: , Rfl:    aspirin EC 81 MG tablet, Take 1 tablet (81 mg total) by mouth daily. Swallow whole., Disp: 90 tablet, Rfl: 3   desvenlafaxine (PRISTIQ) 25 MG 24 hr tablet, 3 tablets qd, Disp: , Rfl:    ezetimibe (ZETIA) 10 MG tablet, Take 1 tablet (10 mg total) by mouth daily., Disp: 90 tablet, Rfl: 3   finasteride (PROSCAR) 5 MG tablet, Take 5 mg by mouth daily., Disp: , Rfl:    multivitamin (ONE-A-DAY MEN'S) TABS tablet, 1 tablet., Disp: , Rfl:    rosuvastatin (CRESTOR) 20 MG tablet, Take 1 tablet (20 mg total) by mouth daily., Disp: 30 tablet, Rfl: 0  Review of Systems:  Constitutional: Denies fever, chills, diaphoresis, appetite change.  HEENT: Denies photophobia, eye pain, redness, hearing loss, ear pain, congestion, sore throat, rhinorrhea, sneezing, mouth sores, trouble swallowing, neck pain, neck stiffness and tinnitus.   Respiratory: Denies SOB, DOE, cough, chest tightness,  and wheezing.   Cardiovascular: Denies chest pain, palpitations and leg swelling.  Gastrointestinal: Denies nausea, vomiting, abdominal pain, diarrhea, constipation, blood in stool and abdominal distention.  Genitourinary: Denies dysuria, urgency, frequency, hematuria, flank pain and difficulty urinating.  Endocrine: Denies: hot or cold intolerance, sweats, changes in hair or nails, polyuria, polydipsia. Musculoskeletal:  Denies myalgias, back pain, joint swelling, arthralgias and gait problem.  Skin: Denies pallor, rash and wound.  Neurological: Denies dizziness, seizures, syncope, weakness, light-headedness, numbness and headaches.  Hematological: Denies adenopathy. Easy bruising, personal or family bleeding history  Psychiatric/Behavioral: Denies suicidal ideation.   Physical Exam: Vitals:   02/19/22 1539  BP: 120/70  Pulse: (!) 59  Temp: 98 F (36.7 C)  TempSrc: Oral  SpO2: 99%  Weight: 167 lb 9.6  oz (76 kg)    Body mass index is 24.05 kg/m.   Constitutional: NAD, calm, comfortable Eyes: PERRL, lids and conjunctivae normal ENMT: Mucous membranes are moist.  Respiratory: clear to auscultation bilaterally, no wheezing, no crackles. Normal respiratory effort. No accessory muscle use.  Cardiovascular: Regular rate and rhythm, no murmurs / rubs / gallops. No extremity edema.   Impression and Plan:  Fatigue, unspecified type - Plan: POCT glycosylated hemoglobin (Hb A1C)  -Etiology not completely clear although several issues could be at play including cardiac ischemia and sleep apnea for which work-up is underway. -He has also scored very highly on PHQ-9.  If above issues are ruled out, I would advise that he return to his mental health provider to maybe consider adjusting psychotropic medications.  [facility] Office Visit from 02/19/2022 in [facility] [place]  PHQ-9 Total Score 19        Time spent:32 minutes reviewing chart, interviewing and examining patient and formulating plan of care.     Lelon Frohlich, MD [facility] at [facility]

## 2022-02-19 NOTE — Procedures (Signed)
[facility] TEST REPORT ( by Watch PAT)   STUDY DATE:  02-17-2022 DOB:  Nov 25, 1955 MRN: : [id number]   ORDERING CLINICIAN: Larey Seat, MD  REFERRING CLINICIANIsaac Bliss, MD   CLINICAL INFORMATION/HISTORY: Eugene Guzman is a 66 y.o. Caucasian male patient seen here in a RV on 01/12/2022 01-12-2022. The patient the patient underwent a home sleep test multivitamin cast on cast on March September 10, 2018 was September 09, 2021 which documented severe sleep apnea not REM sleep dependent but REM sleep dependent but with hypoxia. The mean oxygen saturation was 90% with a minimum of 71% saturation - and many minutes the patient was presenting with low oxygen at night.  This was severe hypoxia.  Supine sleep accentuated the apnea the patient had much less apnea when sleeping on his left side.  There was no REM sleep accentuation as he was fitted for a CPAP device 5 through 20 cm 2 cm EPR and had already 1 follow-up with Frann Rider here in the sleep clinic.  At the time he felt that CPAP is not helping him and that he is actually more fatigued and more sleepy.  He had just found a good median between Abilify and his main antidepressant when he started on CPAP and he feels now that this gain has been eradicated.  He has not grown to like his CPAP. He likes to be re-evaluated.    Epworth sleepiness score: 4/24.   BMI: 24.3kg/m   Neck Circumference: 16"   FINDINGS:   Sleep Summary:   Total Recording Time (hours, min): 9 hours and 49 minutes       Total Sleep Time (hours, min):     9 hours 3 minutes            Percent REM (%):     10.9%                                   Respiratory Indices:   Calculated pAHI (per hour):    40.3/h                         REM pAHI:      38.8/h                                           NREM pAHI:    40.5/h                          Positional AHI:   400 minutes of supine position were recorded, associated with an AHI of 50.8/h  followed by right lateral sleep with an AHI of 10.9/h.  Snoring level reached a mean volume of 42 dB and was present for 32% of total sleep time.                                               Oxygen Saturation Statistics:        O2 Saturation Range (%):    Between a nadir of 78% and maximum of 100% saturation with a mean oxygen saturation of  93%.                                   O2 Saturation (minutes) <89%:    14.3 minutes = the equivalent of 2.6% of total sleep time       Pulse Rate Statistics:          Pulse Range: Between 30 and 86 bpm with a mean heart rate of 51.  Please note that this home sleep test cannot give additional data of limb movements, cardiac rhythm, or respiratory disturbances aside from hypopnea and apnea.               IMPRESSION:  This HST confirms the presence of still severe sleep apnea , but not REM sleep dependent.  His apnea was very dependent on supine sleep position- and the patient was in supine position for over 400 minutes of recording.    RECOMMENDATION:If CPAP is not tolerated, he has a better chance of apnea control by avoiding supine sleep- may be by using a tennis ball or Phillips sleep balance belt.    No prolonged hypoxia was noted either- meaning the patient could entertain an inspire device for treatment.     INTERPRETING PHYSICIAN:   Larey Seat, MD   Medical Director of [facility] at [facility].

## 2022-02-24 ENCOUNTER — Encounter: Payer: Self-pay | Admitting: Neurology

## 2022-02-24 ENCOUNTER — Ambulatory Visit: Payer: Medicare HMO | Admitting: Internal Medicine

## 2022-02-27 DIAGNOSIS — R69 Illness, unspecified: Secondary | ICD-10-CM | POA: Diagnosis not present

## 2022-02-27 DIAGNOSIS — F331 Major depressive disorder, recurrent, moderate: Secondary | ICD-10-CM | POA: Diagnosis not present

## 2022-02-27 DIAGNOSIS — F410 Panic disorder [episodic paroxysmal anxiety] without agoraphobia: Secondary | ICD-10-CM | POA: Diagnosis not present

## 2022-03-02 ENCOUNTER — Encounter: Payer: Self-pay | Admitting: Neurology

## 2022-03-02 ENCOUNTER — Ambulatory Visit: Payer: Medicare HMO | Admitting: Neurology

## 2022-03-02 VITALS — BP 127/74 | HR 71 | Ht 70.0 in | Wt 166.6 lb

## 2022-03-02 DIAGNOSIS — F410 Panic disorder [episodic paroxysmal anxiety] without agoraphobia: Secondary | ICD-10-CM | POA: Diagnosis not present

## 2022-03-02 DIAGNOSIS — G4753 Recurrent isolated sleep paralysis: Secondary | ICD-10-CM | POA: Diagnosis not present

## 2022-03-02 DIAGNOSIS — G4730 Sleep apnea, unspecified: Secondary | ICD-10-CM | POA: Diagnosis not present

## 2022-03-02 DIAGNOSIS — R5382 Chronic fatigue, unspecified: Secondary | ICD-10-CM | POA: Diagnosis not present

## 2022-03-02 DIAGNOSIS — F3289 Other specified depressive episodes: Secondary | ICD-10-CM

## 2022-03-02 DIAGNOSIS — R5383 Other fatigue: Secondary | ICD-10-CM | POA: Insufficient documentation

## 2022-03-02 DIAGNOSIS — I2510 Atherosclerotic heart disease of native coronary artery without angina pectoris: Secondary | ICD-10-CM

## 2022-03-02 DIAGNOSIS — F518 Other sleep disorders not due to a substance or known physiological condition: Secondary | ICD-10-CM | POA: Diagnosis not present

## 2022-03-02 DIAGNOSIS — G4710 Hypersomnia, unspecified: Secondary | ICD-10-CM

## 2022-03-02 DIAGNOSIS — R69 Illness, unspecified: Secondary | ICD-10-CM | POA: Diagnosis not present

## 2022-03-02 DIAGNOSIS — F32A Depression, unspecified: Secondary | ICD-10-CM | POA: Insufficient documentation

## 2022-03-02 NOTE — Progress Notes (Addendum)
SLEEP MEDICINE CLINIC    Provider:  Larey Seat, MD  Primary Care Physician:  Eugene Guzman, Eugene Halsted, MD [place] [address]     Referring Provider: Isaac Guzman, Eugene Guzman, [address],  [place] [address]          Chief Complaint according to patient   Patient presents with:     New Patient (Initial Visit)      Pt referred by Dr. Isaac Guzman for possible OSA. Marland Kitchen       HISTORY OF PRESENT ILLNESS:  Eugene Guzman is a 66 y.o. Caucasian male patient seen here in a RV on 03/02/2022 - he is here to discuss the results of the repeated HST, which still documented severe AHI and much less hypoxia than the first test. I asked him to return to CPAP, yet he feels sleepy and extremely fatigued all day. Epworth score was today endorsed at  12 / 24 and FSS at 54/ 63 points. He reports he is scared to drive and worried al l day that he can't find the energy to run all of today's errants. Mother suffered a fracture in a fall 2 weeks ago, but fatigue did precede this.   I like for him to sleep on the left or right side and avoid supine sleep. I also offered a Inspire consult with ENT, he has worn a bite guard without problems, may consider a dental device as a less invasive treatment.  I also will order a dentist referral to dr Ron Parker. The patient feels his fatigue only started after CPAP initiation.   Eugene Guzman avoids supine sleep now-  Feels that CPAP has not provided him with any recovery of energy on the contrary he feels that fatigue became only an issue after he was evaluated for sleep apnea and treated for CPAP.  I reviewed his compliance report through at the care he is on a manufacturer setting of 5 to 20 cm of water pressure with 2 cm EPR his average daily usage time is 4 hours 29 minutes he has used the machine 75% of days and time.  Residual AHI is 2.4 so this is a significant reduction of apnea and his 95th percentile pressure  is 10.4 cmH2O.  He does not have a high air leakage so the mask seems to fit him pretty well.  There are some central apneas arising they are making up 1.4 of the residual apneas and hypopneas.   HIS fatigue and sleepiness are not related to APNEA !         01-12-2022. The patient the patient underwent a home sleep test multivitamin cast on cast on March September 10, 2018 was September 09, 2021 which documented severe sleep apnea not REM sleep dependent but REM sleep dependent but with hypoxia. The mean oxygen saturation was 90% with a minimum of 71% saturation 470 357 minutes the patient was presenting with low oxygen at night.  This was severe hypoxia.  Supine sleep accentuated the apnea the patient had much less apnea when sleeping on his left side.  There was no REM sleep accentuation as he was fitted for a CPAP device 5 through 20 cm 2 cm EPR and had already 1 follow-up with Frann Rider here in the sleep clinic.  At the time he felt that CPAP is not helping him and that he is actually more fatigued and more sleepy.  He had just found a good  median between Abilify and his main antidepressant when he started on CPAP and he feels now that this gain has been eradicated.  He has not grown to like his CPAP.  He has made wonderful effort.  He has used the machine 87% of the last 30 days average user time 4 hours 46 minutes his AHI is down to 2.0 so numerically the apnea machine works well for him he only needed the 95th percentile pressure of 10.7 cm water but he is reports that this time of use is sleep time;  He is using the machine until he finally can fall asleep and as soon as 4 hours are over he is happy to report off.  He wakes more fatigued, more sleepy. I want to invite him for an lin lab study, qualifying him for CPAP or BiPAP and is using nasal cradle and pillows.  He is not wanting to go through in lab study, due to panic and claustrophobia.  He is willing to repeat a SLEEP STUDY HST to verify  his baseline.  Making sure the hypoxia is true.              from PCP for a  sleep consult.  Chief concern according to patient: " I snored all my life, even in college. My wife sleeps through it, but reports witnessing apnea . And I am a mouth breather.  I had tonsils and adenoids taken at age 69, and that was done to help me breathe, I wore braces".    I have the pleasure of seeing Eugene Guzman on 1=17-2023,  a right-handed Caucasian male with a possible sleep disorder.  He has a past medical history of Health Anxiety, Aortic Thoracic Aneurysm ([place]), Coronary artery calcification seen on CT scan, Depressed, and Hypertension.Abnormal calcium score and abnormal stress test.   Eugene Guzman returns for follow-up of his aortic root and ascending aneurysm. Eugene Guzman is a 66 year old man with a history of anxiety, depression, coronary calcification on CT, dyslipidemia, and borderline hypertension.  He does have a family history of CAD as well.  He had a CT for coronary calcium score done in 2021.  He had a lot of calcium in his coronaries but a stress test showed no evidence of ischemia.  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      Sleep relevant medical history: Nocturia 2-4 times - BPH, Prostate related. Urge incontinence. Rib fracture.  Sleep walking in childhood,  Tonsillectomy yes, no deviated septum repair, no UPPP? all his live depressed, hypothymia.  Family medical /sleep history: no other family member on CPAP with OSA, insomnia, sleep walkers.  Father died at age 81.    Social history:  [place], smoked in HS. Patient is working as Optometrist, still part-time- and lives in a household with  spouse, no pets. Quit 2016 , cigarettes.  The patient currently works part time . Tobacco use: .former smoker   ETOH use: 2 beers a night , Caffeine intake in form of Coffee( /) Soda( /) Tea ( lunchtime 3-4 glasses / total of 36 ounces) . Regular  exercise in form of yard work.   Hobbies :woodworking- Farm work.   Sleep habits are as follows: The patient's dinner time is between 5-6 PM.  The patient goes to bed at  12-2 AM, but falls asleep on the couch , watching TV.   and continues to sleep for 6 hours, wakes for many bathroom  breaks.   The preferred sleep position is supine , with the support of 1-2 pillows. Shoulder pain on the right.  Dreams are reportedly frequent/vivid, not pleasant, night mares.  No PTSD.   9 AM is the usual rise time. The patient wakes up spontaneously at 8 AM. This is new- he used to be a later sleeper.  He reports not feeling refreshed or restored in AM, with symptoms such as morning headaches, and residual fatigue.  Naps are taken infrequently, not needing on Abilify.  Allowing for better nocturnal sleep.    Review of Systems: Out of a complete 14 system review, the patient complains of only the following symptoms, and all other reviewed systems are negative.:  Fatigue, sleepiness , snoring, fragmented sleep, NOCTURIA  Panic attacks,  Claustrophobia/ Worried.     How likely are you to doze in the following situations: 0 = not likely, 1 = slight chance, 2 = moderate chance, 3 = high chance   Sitting and Reading? Watching Television? Sitting inactive in a public place (theater or meeting)? As a passenger in a car for an hour without a break? Lying down in the afternoon when circumstances permit? Sitting and talking to someone? Sitting quietly after lunch without alcohol? In a car, while stopped for a few minutes in traffic?   Total =up to 9 points on CPAP from  4/ 24 points   FSS endorsed at 38/ 63 points.   03-02-2022. Scared to drive ! Epworth score was today endorsed at  12 / 24 and FSS at 54/ 63 points .   Social History   Socioeconomic History   Marital status: Married    Spouse name: [name]   Number of children: 0   Years of education: Not on file   Highest education level:  Bachelor's degree (e.g., BA, AB, BS)  Occupational History   Not on file  Tobacco Use   Smoking status: Former    Types: Cigars   Smokeless tobacco: Never  Substance and Sexual Activity   Alcohol use: Yes    Alcohol/week: 10.0 standard drinks of alcohol    Types: 10 Cans of beer per week    Comment: beer occasionally   Drug use: No   Sexual activity: Not on file  Other Topics Concern   Not on file  Social History Narrative   Lives with wife   Right handed   Caffeine: ice tea daily   Social Determinants of Health   Financial Resource Strain: Low Risk  (09/18/2021)   Overall Financial Resource Strain (CARDIA)    Difficulty of Paying Living Expenses: Not hard at all  Food Insecurity: No Food Insecurity (09/18/2021)   Hunger Vital Sign    Worried About Running Out of Food in the Last Year: Never true    Ran Out of Food in the Last Year: Never true  Transportation Needs: No Transportation Needs (09/18/2021)   PRAPARE - Hydrologist (Medical): No    Lack of Transportation (Non-Medical): No  Physical Activity: Sufficiently Active (09/18/2021)   Exercise Vital Sign    Days of Exercise per Week: 3 days    Minutes of Exercise per Session: 120 min  Stress: Stress Concern Present (09/18/2021)   [place]    Feeling of Stress : To some extent  Social Connections: Moderately Isolated (09/18/2021)   Social Connection and Isolation Panel [NHANES]    Frequency of Communication with Friends and Family:  More than three times a week    Frequency of Social Gatherings with Friends and Family: More than three times a week    Attends Religious Services: Never    Marine scientist or Organizations: No    Attends Music therapist: Not on file    Marital Status: Married    Family History  Problem Relation Age of Onset   Hypertension Mother    Heart attack Father    Ovarian cancer  Maternal Grandmother    Colon cancer Paternal Grandfather     Past Medical History:  Diagnosis Date   Anxiety    Aortic aneurysm ([place])    Coronary artery calcification seen on CT scan    Depressed    Hypertension     Past Surgical History:  Procedure Laterality Date   TONSILLECTOMY  1961     Current Outpatient Medications on File Prior to Visit  Medication Sig Dispense Refill   ALPRAZolam (XANAX) 0.25 MG tablet Take 1 tablet (0.25 mg total) by mouth at bedtime as needed for anxiety. 10 tablet 0   amLODipine (NORVASC) 2.5 MG tablet Take 1 tablet (2.5 mg total) by mouth daily. 90 tablet 3   ARIPiprazole (ABILIFY) 2 MG tablet Take 2 mg by mouth daily.     aspirin EC 81 MG tablet Take 1 tablet (81 mg total) by mouth daily. Swallow whole. 90 tablet 3   desvenlafaxine (PRISTIQ) 25 MG 24 hr tablet 3 tablets qd     ezetimibe (ZETIA) 10 MG tablet Take 1 tablet (10 mg total) by mouth daily. 90 tablet 3   finasteride (PROSCAR) 5 MG tablet Take 5 mg by mouth daily.     multivitamin (ONE-A-DAY MEN'S) TABS tablet 1 tablet.     rosuvastatin (CRESTOR) 20 MG tablet Take 1 tablet (20 mg total) by mouth daily. 30 tablet 0   No current facility-administered medications on file prior to visit.    Allergies  Allergen Reactions   Penicillins     Physical exam:  Today's Vitals   03/02/22 1419  BP: 127/74  Pulse: 71  Weight: 166 lb 9.6 oz (75.6 kg)  Height: '5\' 10"'$  (1.778 m)   Body mass index is 23.9 kg/m.   Wt Readings from Last 3 Encounters:  03/02/22 166 lb 9.6 oz (75.6 kg)  02/19/22 167 lb 9.6 oz (76 kg)  02/10/22 167 lb 3.2 oz (75.8 kg)     Ht Readings from Last 3 Encounters:  03/02/22 '5\' 10"'$  (1.778 m)  02/10/22 '5\' 10"'$  (1.778 m)  01/12/22 '5\' 10"'$  (1.778 m)      General: The patient is awake, alert and appears not in acute distress. The patient is well groomed. Head: Normocephalic, atraumatic. Neck is supple.  Mallampati 3,  neck circumference:16.5 inches. Nasal airflow  patent.  Retrognathia is seen.  Small lower jaw  Dental status:  Cardiovascular:  Regular rate and cardiac rhythm by pulse, without distended neck veins. Respiratory: Lungs are clear to auscultation.  Skin:  Without evidence of ankle edema, or rash. Trunk: The patient's posture is erect.   Neurologic exam : The patient is awake and alert, oriented to place and time.   Memory subjective described as intact.  Attention span & concentration ability appears normal.  Speech is fluent,  without  dysarthria, dysphonia or aphasia.  Mood and affect are appropriate.   Cranial nerves: no loss of smell or taste reported  Pupils are equal and briskly reactive to light. Funduscopic exam deferred.  Extraocular movements in vertical and horizontal planes were intact and without nystagmus. No Diplopia. Visual fields by finger perimetry are intact. Hearing was intact to tuning fork.  Facial sensation intact to fine touch. Facial motor strength is symmetric and tongue and uvula move midline.  Neck ROM : rotation, tilt and flexion extension were normal for age and shoulder shrug was symmetrical.    Motor exam:  Symmetric bulk, tone and ROM.   Normal tone without cog -wheeling, symmetric grip strength .   Sensory:  Fine touch, pinprick and vibration were tested  and  normal.  Proprioception tested in the upper extremities was normal.   Coordination: Rapid alternating movements in the fingers/hands were of normal speed.  The Finger-to-nose maneuver was intact without evidence of ataxia, dysmetria or tremor.   Gait and station: Patient could rise unassisted from a seated position, walked without assistive device.  Stance is of normal width/ base and the patient turned with 3 steps.  Toe and heel walk were deferred.  Deep tendon reflexes: in the upper and lower extremities are symmetric and intact.  Babinski response was normal        After spending a total time of  25  minutes face to face and  additional time for physical and neurologic examination, review of laboratory studies,  personal review of imaging studies, reports and results of other testing and review of referral information / records as far as provided in visit, I have established the following assessments:  Intolerant of CPAP.   Medication reviewed, recent labs reviewed, low Vit D diagnosed,   He had just found a good median between Abilify and his main antidepressant when he started on CPAP and he feels now that this gain has been eradicated.  He has not grown to like his CPAP.  He has made wonderful effort.  He has used the machine 87% of the last 30 days average user time 4 hours 46 minutes his AHI is down to 2.0 so numerically the apnea machine works well for him he only needed the 95th percentile pressure of 10.7 cm water but he is reports that this time of use is sleep time;  He is using the machine until he finally can fall asleep and as soon as 4 hours are over he is happy to report off.  He wakes more fatigued, more sleepy. I want to invite him for an lin lab study, qualifying him for CPAP or BiPAP and is using nasal cradle and pillows.  His fatigue can be depression related and worsening by [facility]  for  panic and claustrophobia.   Reduce alcohol : one can per evening of light beer.     My Plan is to proceed with:  1) referral for rheumatology panel.  2) offered referral to sleep dentistry.  3) Offered advise on positional therapy. Tennisball.   I would like to thank Eugene Guzman, Eugene Halsted, MD and Eugene Guzman, Eugene Guzman, [address] [place],  [place] [address] for allowing me to meet with and to take care of this pleasant patient.     NP in 6 months ,   Electronically signed by: Larey Seat, MD 03/02/2022 2:50 PM  [facility] [place] certified by The [employer] of Sleep Medicine and Diplomate of the [employer] of Sleep  Medicine. Board certified In Neurology through the [place], Fellow of the [employer] of Neurology. Medical Director of [employer].

## 2022-03-02 NOTE — Patient Instructions (Signed)
Fatigue If you have fatigue, you feel tired all the time and have a lack of energy or a lack of motivation. Fatigue may make it difficult to start or complete tasks because of exhaustion. Occasional or mild fatigue is often a normal response to activity or life. However, long-term (chronic) or extreme fatigue may be a symptom of a medical condition such as: Depression. Not having enough red blood cells or hemoglobin in the blood (anemia). A problem with a small gland located in the lower front part of the neck (thyroid disorder). Rheumatologic conditions. These are problems related to the body's defense system (immune system). Infections, especially certain viral infections. Fatigue can also lead to negative health outcomes over time. Follow these instructions at home: Medicines Take over-the-counter and prescription medicines only as told by your health care provider. Take a multivitamin if told by your health care provider. Do not use herbal or dietary supplements unless they are approved by your health care provider. Eating and drinking  Avoid heavy meals in the evening. Eat a well-balanced diet, which includes lean proteins, whole grains, plenty of fruits and vegetables, and low-fat dairy products. Avoid eating or drinking too many products with caffeine in them. Avoid alcohol. Drink enough fluid to keep your urine pale yellow. Activity  Exercise regularly, as told by your health care provider. Use or practice techniques to help you relax, such as yoga, tai chi, meditation, or massage therapy. Lifestyle Change situations that cause you stress. Try to keep your work and personal schedules in balance. Do not use recreational or illegal drugs. General instructions Monitor your fatigue for any changes. Go to bed and get up at the same time every day. Avoid fatigue by pacing yourself during the day and getting enough sleep at night. Maintain a healthy weight. Contact a health care  provider if: Your fatigue does not get better. You have a fever. You suddenly lose or gain weight. You have headaches. You have trouble falling asleep or sleeping through the night. You feel angry, guilty, anxious, or sad. You have swelling in your legs or another part of your body. Get help right away if: You feel confused, feel like you might faint, or faint. Your vision is blurry or you have a severe headache. You have severe pain in your abdomen, your back, or the area between your waist and hips (pelvis). You have chest pain, shortness of breath, or an irregular or fast heartbeat. You are unable to urinate, or you urinate less than normal. You have abnormal bleeding from the rectum, nose, lungs, nipples, or, if you are male, the vagina. You vomit blood. You have thoughts about hurting yourself or others. These symptoms may be an emergency. Get help right away. Call 911. Do not wait to see if the symptoms will go away. Do not drive yourself to the hospital. Get help right away if you feel like you may hurt yourself or others, or have thoughts about taking your own life. Go to your nearest emergency room or: Call 911. Call the [facility] at [phone] or 988. This is open 24 hours a day. Text the Crisis Text Line at 979-823-0385. Summary If you have fatigue, you feel tired all the time and have a lack of energy or a lack of motivation. Fatigue may make it difficult to start or complete tasks because of exhaustion. Long-term (chronic) or extreme fatigue may be a symptom of a medical condition. Exercise regularly, as told by your health care provider.  Change situations that cause you stress. Try to keep your work and personal schedules in balance. This information is not intended to replace advice given to you by your health care provider. Make sure you discuss any questions you have with your health care provider. Document Revised: 04/14/2021 Document  Reviewed: 04/14/2021 Elsevier Patient Education  2023 Elsevier Inc.  

## 2022-03-02 NOTE — Addendum Note (Signed)
Addended by: Larey Seat on: 03/02/2022 03:21 PM   Modules accepted: Orders

## 2022-03-03 DIAGNOSIS — G4733 Obstructive sleep apnea (adult) (pediatric): Secondary | ICD-10-CM | POA: Diagnosis not present

## 2022-03-04 ENCOUNTER — Other Ambulatory Visit (HOSPITAL_COMMUNITY): Payer: Medicare HMO

## 2022-03-04 ENCOUNTER — Other Ambulatory Visit: Payer: Self-pay | Admitting: Internal Medicine

## 2022-03-04 LAB — ANA,IFA RA DIAG PNL W/RFLX TIT/PATN
ANA Titer 1: NEGATIVE
Cyclic Citrullin Peptide Ab: <1 units (ref 0–19)
Rheumatoid fact SerPl-aCnc: <10 IU/mL (ref <14.0)

## 2022-03-04 LAB — CK TOTAL AND CKMB (NOT AT ARMC)
CK-MB Index: 2.6 ng/mL (ref 0.0–10.4)
Total CK: 105 U/L (ref 41–331)

## 2022-03-05 ENCOUNTER — Encounter: Payer: Self-pay | Admitting: Neurology

## 2022-03-05 ENCOUNTER — Encounter: Payer: Self-pay | Admitting: Internal Medicine

## 2022-03-11 ENCOUNTER — Ambulatory Visit: Payer: Medicare HMO | Admitting: Neurology

## 2022-03-11 LAB — NARCOLEPSY EVALUATION
DQA1*01:02: NEGATIVE
DQB1*06:02: NEGATIVE

## 2022-03-12 DIAGNOSIS — F332 Major depressive disorder, recurrent severe without psychotic features: Secondary | ICD-10-CM | POA: Diagnosis not present

## 2022-03-12 DIAGNOSIS — F410 Panic disorder [episodic paroxysmal anxiety] without agoraphobia: Secondary | ICD-10-CM | POA: Diagnosis not present

## 2022-03-12 DIAGNOSIS — R69 Illness, unspecified: Secondary | ICD-10-CM | POA: Diagnosis not present

## 2022-03-19 ENCOUNTER — Encounter: Payer: Self-pay | Admitting: Neurology

## 2022-03-20 DIAGNOSIS — F410 Panic disorder [episodic paroxysmal anxiety] without agoraphobia: Secondary | ICD-10-CM | POA: Diagnosis not present

## 2022-03-20 DIAGNOSIS — R69 Illness, unspecified: Secondary | ICD-10-CM | POA: Diagnosis not present

## 2022-03-20 DIAGNOSIS — F332 Major depressive disorder, recurrent severe without psychotic features: Secondary | ICD-10-CM | POA: Diagnosis not present

## 2022-04-03 DIAGNOSIS — G4733 Obstructive sleep apnea (adult) (pediatric): Secondary | ICD-10-CM | POA: Diagnosis not present

## 2022-04-15 ENCOUNTER — Encounter: Payer: Self-pay | Admitting: Internal Medicine

## 2022-04-16 ENCOUNTER — Telehealth (HOSPITAL_COMMUNITY): Payer: Self-pay | Admitting: Emergency Medicine

## 2022-04-16 ENCOUNTER — Telehealth: Payer: Self-pay

## 2022-04-16 DIAGNOSIS — I2510 Atherosclerotic heart disease of native coronary artery without angina pectoris: Secondary | ICD-10-CM

## 2022-04-16 NOTE — Telephone Encounter (Signed)
Pt with severe coronary calcifications   Some fatigue.   Set up for PET/CT

## 2022-04-16 NOTE — Telephone Encounter (Signed)
Attestation for Cardiac Pet Scan... sending to Dr Harrington Challenger.

## 2022-04-17 DIAGNOSIS — F410 Panic disorder [episodic paroxysmal anxiety] without agoraphobia: Secondary | ICD-10-CM | POA: Diagnosis not present

## 2022-04-17 DIAGNOSIS — R69 Illness, unspecified: Secondary | ICD-10-CM | POA: Diagnosis not present

## 2022-04-17 DIAGNOSIS — F332 Major depressive disorder, recurrent severe without psychotic features: Secondary | ICD-10-CM | POA: Diagnosis not present

## 2022-04-20 ENCOUNTER — Telehealth (HOSPITAL_COMMUNITY): Payer: Self-pay | Admitting: Emergency Medicine

## 2022-04-20 ENCOUNTER — Ambulatory Visit: Payer: Medicare HMO | Admitting: Neurology

## 2022-04-20 NOTE — Telephone Encounter (Signed)
Reaching out to patient to offer assistance regarding upcoming cardiac imaging study; pt verbalizes understanding of appt date/time, parking situation and where to check in, pre-test NPO status and medications ordered, and verified current allergies; name and call back number provided for further questions should they arise Eugene Bond RN Navigator Cardiac Imaging [facility] [phone] office [phone] cell  Arrival 245, [facility] main entrance Denies iv issues CLAUSTRO- wife driving- taking lorazepam 1 h prior to test No caffeine 12 h No food 3 h

## 2022-04-21 ENCOUNTER — Encounter (HOSPITAL_COMMUNITY)
Admission: RE | Admit: 2022-04-21 | Discharge: 2022-04-21 | Disposition: A | Discharge status: Home / Self Care | Payer: Medicare HMO | Source: Ambulatory Visit | Attending: Internal Medicine | Admitting: Internal Medicine

## 2022-04-21 DIAGNOSIS — I2510 Atherosclerotic heart disease of native coronary artery without angina pectoris: Secondary | ICD-10-CM | POA: Diagnosis not present

## 2022-04-21 DIAGNOSIS — R5383 Other fatigue: Secondary | ICD-10-CM | POA: Diagnosis not present

## 2022-04-21 DIAGNOSIS — E785 Hyperlipidemia, unspecified: Secondary | ICD-10-CM | POA: Insufficient documentation

## 2022-04-21 DIAGNOSIS — I10 Essential (primary) hypertension: Secondary | ICD-10-CM | POA: Diagnosis not present

## 2022-04-21 MED ORDER — REGADENOSON 0.4 MG/5ML IV SOLN: 0.4000 mg | Freq: Once | INTRAVENOUS | Status: DC

## 2022-04-21 MED ORDER — RUBIDIUM RB82 GENERATOR (RUBYFILL)
19.7900 | PACK | Freq: Once | INTRAVENOUS | Status: AC
  Administered 2022-04-21: 19.79 via INTRAVENOUS

## 2022-04-21 MED ORDER — RUBIDIUM RB82 GENERATOR (RUBYFILL)
19.8100 | PACK | Freq: Once | INTRAVENOUS | Status: AC
  Administered 2022-04-21: 19.81 via INTRAVENOUS

## 2022-04-21 MED ORDER — REGADENOSON 0.4 MG/5ML IV SOLN
INTRAVENOUS | Status: AC
  Filled 2022-04-21: qty 5

## 2022-04-22 ENCOUNTER — Encounter: Payer: Self-pay | Admitting: Internal Medicine

## 2022-04-22 LAB — NM PET CT CARDIAC PERFUSION MULTI W/ABSOLUTE BLOODFLOW
LV dias vol: 97 mL (ref 62–150)
LV sys vol: 34 mL
MBFR: 1.88
Nuc Rest EF: 59 %
Nuc Stress EF: 65 %
Peak HR: 75 {beats}/min
Rest HR: 62 {beats}/min
Rest MBF: 1.04 ml/g/min
Rest Nuclear Isotope Dose: 19.8 mCi
ST Depression (mm): 0 mm
Stress MBF: 1.95 ml/g/min
Stress Nuclear Isotope Dose: 19.8 mCi
TID: 1.02

## 2022-04-23 ENCOUNTER — Encounter: Payer: Self-pay | Admitting: Internal Medicine

## 2022-04-23 ENCOUNTER — Ambulatory Visit: Payer: Medicare HMO | Attending: Internal Medicine | Admitting: Internal Medicine

## 2022-04-23 VITALS — BP 118/60 | HR 67 | Wt 156.6 lb

## 2022-04-23 DIAGNOSIS — Z01810 Encounter for preprocedural cardiovascular examination: Secondary | ICD-10-CM

## 2022-04-23 NOTE — Telephone Encounter (Signed)
Pt to come in today to see Dr Harrington Challenger for updated H and P/ labs/ and EKG for [facility] on 04/28/22 at 10:30 am with Dr Ellyn Hack.

## 2022-04-23 NOTE — H&P (View-Only) (Signed)
Cardiology Office Note   Date:  04/23/2022   ID:  Eugene Guzman, DOB [birth year], MRN [id number]  PCP:  Isaac Bliss, Rayford Halsted, MD  Cardiologist:   Dorris Carnes, MD   Patient presents for follow up of CAD    History of Present Illness: Eugene Guzman is a 66 y.o. male  CAD.  Pt has a calcium score CT in 2021  score was 2118 (97th percentile for age)   Calcificatons noted in all coronary arteries.   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   he has been followed by Leonarda Salon with serial CT exams    The pt is also followed by psych for anxiety, depressio  I saw the pt in Aug    He noted some fatigue at the tme,   he and his wife questioned if it was due to his heart   Denied CP   He just had   PET / CT done yesterday showed normal perfuision     LVEF normal  No TID    Flow reserve was down a little 1.88    I have reviewed with pt and his wife   Given this abnormal response and the severity of his coronary calcifications I would recomm L heart cath to define anatomy     Comes in today for labs  Denies CP  Breathing is OK   Does have fatigue though says he gets through activities OK     Current Meds  Medication Sig   amLODipine (NORVASC) 2.5 MG tablet Take 1 tablet (2.5 mg total) by mouth daily.   aspirin EC 81 MG tablet Take 1 tablet (81 mg total) by mouth daily. Swallow whole.   ezetimibe (ZETIA) 10 MG tablet Take 1 tablet (10 mg total) by mouth daily.   finasteride (PROSCAR) 5 MG tablet Take 5 mg by mouth daily.   imipramine (TOFRANIL) 25 MG tablet Take 50 mg by mouth at bedtime.   LORazepam (ATIVAN) 0.5 MG tablet Take 0.5 mg by mouth at bedtime. Take 0.25 mg - 0.5 mg mg as need anxiety during the day   multivitamin (ONE-A-DAY MEN'S) TABS tablet Take 1 tablet by mouth daily.   rosuvastatin (CRESTOR) 20 MG tablet Take 1 tablet (20 mg total) by mouth daily.     Allergies:    Penicillins   Past Medical History:  Diagnosis Date   Anxiety    Aortic aneurysm ([place])    Coronary artery calcification seen on CT scan    Depressed    Hypertension     Past Surgical History:  Procedure Laterality Date   TONSILLECTOMY  1961     Social History:  The patient  reports that he has quit smoking. His smoking use included cigars. He has never used smokeless tobacco. He reports current alcohol use of about 10.0 standard drinks of alcohol per week. He reports that he does not use drugs.   Family History:  The patient's family history includes Colon cancer in his paternal grandfather; Heart attack in his father; Hypertension in his mother; Ovarian cancer in his maternal grandmother.    ROS:  Please see the history of present illness. All other systems are reviewed and  Negative to the above problem except as noted.    PHYSICAL EXAM: VS:  BP 118/60   Pulse 67  Wt 156 lb 9.6 oz (71 kg)   SpO2 96%   BMI 22.47 kg/m   GEN:  Thin 66 yo in no acute distress  HEENT: normal  Neck: no JVD, no carotid bruit Cardiac: RRR; no murmur  No LE edema  Respiratory:  clear to auscultation bilaterally GI: soft, nontender, nondistended, + BS  No hepatomegaly  MS: no deformity Moving all extremities   Skin: warm and dry, no rash Neuro:  Strength and sensation are intact Psych: euthymic mood, full affect   EKG:  EKG not done today  Just had Lexiscan myoview    Lipid Panel   Last lipds in May LDL 134  HDL 67  Trig 141   LDL in 2010 was 172      Component Value Date/Time   CHOL 166 05/06/2021 1136   CHOL 160 03/04/2021 1117   TRIG 52.0 05/06/2021 1136   HDL 83.40 05/06/2021 1136   HDL 92 03/04/2021 1117   CHOLHDL 2 05/06/2021 1136   VLDL 10.4 05/06/2021 1136   LDLCALC 72 05/06/2021 1136   LDLCALC 54 03/04/2021 1117      Wt Readings from Last 3 Encounters:  04/23/22 156 lb 9.6 oz (71 kg)  03/02/22 166 lb 9.6 oz (75.6 kg)  02/19/22 167 lb 9.6 oz (76 kg)       ASSESSMENT AND PLAN:   1  CAD   Pt with extensive coronary calcifications on CT   Negative myoview in 2021   Denies CP but overall lacks energy  When he gets going he says he feels OK   PET/CT yesterday showed normal perfusion but mildly decreased flow reseerve   Given "fatigue" would reocmm LHC to finally define anatomy     Risks benefits described   Pt understands and agrees to proceed    He is very anxoius about this  Keep on ASA 81 mg daily     2   Thoracic aneurysm   Follows with S Hendrickson  3  HTN   BP is a good today     4  HL    Excellent control  LDL 49  HDL 74   Continue meds      Current medicines are reviewed at length with the patient today.  The patient does not have concerns regarding medicines.  Signed, Dorris Carnes, MD  04/23/2022 2:07 PM    [address], [place], [place]  [address] Phone: [phone]; Fax: [phone]

## 2022-04-23 NOTE — Patient Instructions (Signed)
Medication Instructions:   *If you need a refill on your cardiac medications before your next appointment, please call your pharmacy*   Lab Work: BMET AND CBC TODAY   If you have labs (blood work) drawn today and your tests are completely normal, you will receive your results only by: [place] (if you have MyChart) OR A paper copy in the mail If you have any lab test that is abnormal or we need to change your treatment, we will call you to review the results.   Testing/Procedures:    Follow-Up: At [facility], you and your health needs are our priority.  As part of our continuing mission to provide you with exceptional heart care, we have created designated Provider Care Teams.  These Care Teams include your primary Cardiologist (physician) and Advanced Practice Providers (APPs -  Physician Assistants and Nurse Practitioners) who all work together to provide you with the care you need, when you need it.  We recommend signing up for the patient portal called "MyChart".  Sign up information is provided on this After Visit Summary.  MyChart is used to connect with patients for Virtual Visits (Telemedicine).  Patients are able to view lab/test results, encounter notes, upcoming appointments, etc.  Non-urgent messages can be sent to your provider as well.   To learn more about what you can do with MyChart, go to [email or web address].     Important Information About Sugar

## 2022-04-23 NOTE — Progress Notes (Signed)
Cardiology Office Note   Date:  04/23/2022   ID:  LUNDEN MCLEISH, DOB August 05, 1955, MRN [id number]  PCP:  Isaac Bliss, Rayford Halsted, MD  Cardiologist:   Dorris Carnes, MD   Patient presents for follow up of CAD    History of Present Illness: Eugene Guzman is a 66 y.o. male  CAD.  Pt has a calcium score CT in 2021  score was 2118 (97th percentile for age)   Calcificatons noted in all coronary arteries.   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   he has been followed by Leonarda Salon with serial CT exams    The pt is also followed by psych for anxiety, depressio  I saw the pt in Aug    He noted some fatigue at the tme,   he and his wife questioned if it was due to his heart   Denied CP   He just had   PET / CT done yesterday showed normal perfuision     LVEF normal  No TID    Flow reserve was down a little 1.88    I have reviewed with pt and his wife   Given this abnormal response and the severity of his coronary calcifications I would recomm L heart cath to define anatomy     Comes in today for labs  Denies CP  Breathing is OK   Does have fatigue though says he gets through activities OK     Current Meds  Medication Sig   amLODipine (NORVASC) 2.5 MG tablet Take 1 tablet (2.5 mg total) by mouth daily.   aspirin EC 81 MG tablet Take 1 tablet (81 mg total) by mouth daily. Swallow whole.   ezetimibe (ZETIA) 10 MG tablet Take 1 tablet (10 mg total) by mouth daily.   finasteride (PROSCAR) 5 MG tablet Take 5 mg by mouth daily.   imipramine (TOFRANIL) 25 MG tablet Take 50 mg by mouth at bedtime.   LORazepam (ATIVAN) 0.5 MG tablet Take 0.5 mg by mouth at bedtime. Take 0.25 mg - 0.5 mg mg as need anxiety during the day   multivitamin (ONE-A-DAY MEN'S) TABS tablet Take 1 tablet by mouth daily.   rosuvastatin (CRESTOR) 20 MG tablet Take 1 tablet (20 mg total) by mouth daily.     Allergies:    Penicillins   Past Medical History:  Diagnosis Date   Anxiety    Aortic aneurysm ([place])    Coronary artery calcification seen on CT scan    Depressed    Hypertension     Past Surgical History:  Procedure Laterality Date   TONSILLECTOMY  1961     Social History:  The patient  reports that he has quit smoking. His smoking use included cigars. He has never used smokeless tobacco. He reports current alcohol use of about 10.0 standard drinks of alcohol per week. He reports that he does not use drugs.   Family History:  The patient's family history includes Colon cancer in his paternal grandfather; Heart attack in his father; Hypertension in his mother; Ovarian cancer in his maternal grandmother.    ROS:  Please see the history of present illness. All other systems are reviewed and  Negative to the above problem except as noted.    PHYSICAL EXAM: VS:  BP 118/60   Pulse 67  Wt 156 lb 9.6 oz (71 kg)   SpO2 96%   BMI 22.47 kg/m   GEN:  Thin 66 yo in no acute distress  HEENT: normal  Neck: no JVD, no carotid bruit Cardiac: RRR; no murmur  No LE edema  Respiratory:  clear to auscultation bilaterally GI: soft, nontender, nondistended, + BS  No hepatomegaly  MS: no deformity Moving all extremities   Skin: warm and dry, no rash Neuro:  Strength and sensation are intact Psych: euthymic mood, full affect   EKG:  EKG not done today  Just had Lexiscan myoview    Lipid Panel   Last lipds in May LDL 134  HDL 67  Trig 141   LDL in 2010 was 172      Component Value Date/Time   CHOL 166 05/06/2021 1136   CHOL 160 03/04/2021 1117   TRIG 52.0 05/06/2021 1136   HDL 83.40 05/06/2021 1136   HDL 92 03/04/2021 1117   CHOLHDL 2 05/06/2021 1136   VLDL 10.4 05/06/2021 1136   LDLCALC 72 05/06/2021 1136   LDLCALC 54 03/04/2021 1117      Wt Readings from Last 3 Encounters:  04/23/22 156 lb 9.6 oz (71 kg)  03/02/22 166 lb 9.6 oz (75.6 kg)  02/19/22 167 lb 9.6 oz (76 kg)       ASSESSMENT AND PLAN:   1  CAD   Pt with extensive coronary calcifications on CT   Negative myoview in 2021   Denies CP but overall lacks energy  When he gets going he says he feels OK   PET/CT yesterday showed normal perfusion but mildly decreased flow reseerve   Given "fatigue" would reocmm LHC to finally define anatomy     Risks benefits described   Pt understands and agrees to proceed    He is very anxoius about this  Keep on ASA 81 mg daily     2   Thoracic aneurysm   Follows with S Hendrickson  3  HTN   BP is a good today     4  HL    Excellent control  LDL 49  HDL 74   Continue meds      Current medicines are reviewed at length with the patient today.  The patient does not have concerns regarding medicines.  Signed, Dorris Carnes, MD  04/23/2022 2:07 PM    [place], [place], [place]  [address] Phone: [phone]; Fax: [phone]

## 2022-04-24 ENCOUNTER — Telehealth: Payer: Self-pay | Admitting: Internal Medicine

## 2022-04-24 ENCOUNTER — Encounter: Payer: Self-pay | Admitting: Internal Medicine

## 2022-04-24 LAB — CBC
Hematocrit: 42.1 % (ref 37.5–51.0)
Hemoglobin: 14.4 g/dL (ref 13.0–17.7)
MCH: 31 pg (ref 26.6–33.0)
MCHC: 34.2 g/dL (ref 31.5–35.7)
MCV: 91 fL (ref 79–97)
Platelets: 189 10*3/uL (ref 150–450)
RBC: 4.65 x10E6/uL (ref 4.14–5.80)
RDW: 12.9 % (ref 11.6–15.4)
WBC: 14 10*3/uL — ABNORMAL HIGH (ref 3.4–10.8)

## 2022-04-24 LAB — BASIC METABOLIC PANEL
BUN/Creatinine Ratio: 12 (ref 10–24)
BUN: 15 mg/dL (ref 8–27)
CO2: 23 mmol/L (ref 20–29)
Calcium: 9.6 mg/dL (ref 8.6–10.2)
Chloride: 99 mmol/L (ref 96–106)
Creatinine, Ser: 1.25 mg/dL (ref 0.76–1.27)
Glucose: 100 mg/dL — ABNORMAL HIGH (ref 70–99)
Potassium: 4.5 mmol/L (ref 3.5–5.2)
Sodium: 138 mmol/L (ref 134–144)
eGFR: 63 mL/min/{1.73_m2} (ref >59)

## 2022-04-24 NOTE — Telephone Encounter (Signed)
When he comes for cath on Tuesday add CBC with differential    WBC 14   No evidence of infecitin when I saw him

## 2022-04-27 ENCOUNTER — Telehealth: Payer: Self-pay | Admitting: *Deleted

## 2022-04-27 NOTE — Telephone Encounter (Signed)
Cardiac Catheterization scheduled at [facility] for: Tuesday April 28, 2022 10:30 AM Arrival time and place: [facility] Main Entrance A at: 8 AM -needs CBC /diff  Nothing to eat after midnight prior to procedure, clear liquids until 5 AM day of procedure.  Medication instructions: -Usual morning medications can be taken with sips of water including aspirin 81 mg.  Confirmed patient has responsible adult to drive home post procedure and be with patient first 24 hours after arriving home.  Patient reports no new symptoms concerning for COVID-19 in the past 10 days.  Reviewed procedure instructions with patient.   Patient reports afebrile/no signs/symptoms of infection.

## 2022-04-28 ENCOUNTER — Encounter (HOSPITAL_COMMUNITY)
Admission: RE | Disposition: A | Discharge status: Home / Self Care | Payer: Self-pay | Source: Home / Self Care | Attending: Cardiology

## 2022-04-28 ENCOUNTER — Ambulatory Visit (HOSPITAL_COMMUNITY)
Admission: RE | Admit: 2022-04-28 | Discharge: 2022-04-28 | Disposition: A | Discharge status: Home / Self Care | Payer: Medicare HMO | Attending: Cardiology | Admitting: Cardiology

## 2022-04-28 ENCOUNTER — Other Ambulatory Visit: Payer: Self-pay

## 2022-04-28 DIAGNOSIS — Z7982 Long term (current) use of aspirin: Secondary | ICD-10-CM | POA: Diagnosis not present

## 2022-04-28 DIAGNOSIS — E785 Hyperlipidemia, unspecified: Secondary | ICD-10-CM | POA: Insufficient documentation

## 2022-04-28 DIAGNOSIS — I10 Essential (primary) hypertension: Secondary | ICD-10-CM | POA: Diagnosis not present

## 2022-04-28 DIAGNOSIS — R69 Illness, unspecified: Secondary | ICD-10-CM | POA: Diagnosis not present

## 2022-04-28 DIAGNOSIS — I25110 Atherosclerotic heart disease of native coronary artery with unstable angina pectoris: Secondary | ICD-10-CM | POA: Insufficient documentation

## 2022-04-28 DIAGNOSIS — R5383 Other fatigue: Secondary | ICD-10-CM

## 2022-04-28 DIAGNOSIS — F32A Depression, unspecified: Secondary | ICD-10-CM | POA: Diagnosis not present

## 2022-04-28 DIAGNOSIS — I2584 Coronary atherosclerosis due to calcified coronary lesion: Secondary | ICD-10-CM | POA: Diagnosis not present

## 2022-04-28 DIAGNOSIS — Z01812 Encounter for preprocedural laboratory examination: Secondary | ICD-10-CM

## 2022-04-28 DIAGNOSIS — I2510 Atherosclerotic heart disease of native coronary artery without angina pectoris: Secondary | ICD-10-CM

## 2022-04-28 DIAGNOSIS — I712 Thoracic aortic aneurysm, without rupture, unspecified: Secondary | ICD-10-CM | POA: Insufficient documentation

## 2022-04-28 DIAGNOSIS — I2089 Other forms of angina pectoris: Secondary | ICD-10-CM

## 2022-04-28 DIAGNOSIS — Z87891 Personal history of nicotine dependence: Secondary | ICD-10-CM | POA: Diagnosis not present

## 2022-04-28 DIAGNOSIS — F419 Anxiety disorder, unspecified: Secondary | ICD-10-CM | POA: Diagnosis not present

## 2022-04-28 DIAGNOSIS — R9439 Abnormal result of other cardiovascular function study: Secondary | ICD-10-CM

## 2022-04-28 HISTORY — PX: INTRAVASCULAR PRESSURE WIRE/FFR STUDY: CATH118243

## 2022-04-28 HISTORY — PX: LEFT HEART CATH AND CORONARY ANGIOGRAPHY: CATH118249

## 2022-04-28 LAB — CBC WITH DIFFERENTIAL/PLATELET
Abs Immature Granulocytes: 0 10*3/uL (ref 0.00–0.07)
Basophils Absolute: 0.1 10*3/uL (ref 0.0–0.1)
Basophils Relative: 1 %
Eosinophils Absolute: 0.1 10*3/uL (ref 0.0–0.5)
Eosinophils Relative: 1 %
HCT: 42.8 % (ref 39.0–52.0)
Hemoglobin: 14.8 g/dL (ref 13.0–17.0)
Lymphocytes Relative: 61 %
Lymphs Abs: 8.1 10*3/uL — ABNORMAL HIGH (ref 0.7–4.0)
MCH: 31.6 pg (ref 26.0–34.0)
MCHC: 34.6 g/dL (ref 30.0–36.0)
MCV: 91.3 fL (ref 80.0–100.0)
Monocytes Absolute: 0.5 10*3/uL (ref 0.1–1.0)
Monocytes Relative: 4 %
Neutro Abs: 4.4 10*3/uL (ref 1.7–7.7)
Neutrophils Relative %: 33 %
Platelets: 181 10*3/uL (ref 150–400)
RBC: 4.69 MIL/uL (ref 4.22–5.81)
RDW: 12.9 % (ref 11.5–15.5)
WBC: 13.3 10*3/uL — ABNORMAL HIGH (ref 4.0–10.5)
nRBC: 0 % (ref 0.0–0.2)
nRBC: 0 /100 WBC

## 2022-04-28 LAB — POCT ACTIVATED CLOTTING TIME: Activated Clotting Time: 275 seconds

## 2022-04-28 SURGERY — LEFT HEART CATH AND CORONARY ANGIOGRAPHY
Anesthesia: LOCAL

## 2022-04-28 MED ORDER — SODIUM CHLORIDE 0.9% FLUSH: 3.0000 mL | INTRAVENOUS | Status: DC | PRN

## 2022-04-28 MED ORDER — HEPARIN (PORCINE) IN NACL 1000-0.9 UT/500ML-% IV SOLN
INTRAVENOUS | Status: DC | PRN
  Administered 2022-04-28 (×2): 500 mL

## 2022-04-28 MED ORDER — ONDANSETRON HCL 4 MG/2ML IJ SOLN: 4.0000 mg | Freq: Four times a day (QID) | INTRAMUSCULAR | Status: DC | PRN

## 2022-04-28 MED ORDER — LIDOCAINE HCL (PF) 1 % IJ SOLN
INTRAMUSCULAR | Status: DC | PRN
  Administered 2022-04-28: 2 mL

## 2022-04-28 MED ORDER — ASPIRIN 81 MG PO CHEW: 81.0000 mg | CHEWABLE_TABLET | ORAL | Status: DC

## 2022-04-28 MED ORDER — LIDOCAINE HCL (PF) 1 % IJ SOLN
INTRAMUSCULAR | Status: AC
  Filled 2022-04-28: qty 30

## 2022-04-28 MED ORDER — MORPHINE SULFATE (PF) 2 MG/ML IV SOLN: 1.0000 mg | INTRAVENOUS | Status: DC | PRN

## 2022-04-28 MED ORDER — HYDRALAZINE HCL 20 MG/ML IJ SOLN: 10.0000 mg | INTRAMUSCULAR | Status: DC | PRN

## 2022-04-28 MED ORDER — IOHEXOL 350 MG/ML SOLN
INTRAVENOUS | Status: DC | PRN
  Administered 2022-04-28: 105 mL

## 2022-04-28 MED ORDER — SODIUM CHLORIDE 0.9 % IV SOLN: 250.0000 mL | INTRAVENOUS | Status: DC | PRN

## 2022-04-28 MED ORDER — ACETAMINOPHEN 325 MG PO TABS: 650.0000 mg | ORAL_TABLET | ORAL | Status: DC | PRN

## 2022-04-28 MED ORDER — HEPARIN SODIUM (PORCINE) 1000 UNIT/ML IJ SOLN
INTRAMUSCULAR | Status: AC
  Filled 2022-04-28: qty 10

## 2022-04-28 MED ORDER — MIDAZOLAM HCL 2 MG/2ML IJ SOLN
INTRAMUSCULAR | Status: AC
  Filled 2022-04-28: qty 2

## 2022-04-28 MED ORDER — SODIUM CHLORIDE 0.9 % WEIGHT BASED INFUSION
3.0000 mL/kg/h | INTRAVENOUS | Status: AC
  Administered 2022-04-28: 3 mL/kg/h via INTRAVENOUS

## 2022-04-28 MED ORDER — SODIUM CHLORIDE 0.9 % WEIGHT BASED INFUSION: 1.0000 mL/kg/h | INTRAVENOUS | Status: DC

## 2022-04-28 MED ORDER — HEPARIN SODIUM (PORCINE) 1000 UNIT/ML IJ SOLN
INTRAMUSCULAR | Status: DC | PRN
  Administered 2022-04-28 (×2): 3500 [IU] via INTRAVENOUS

## 2022-04-28 MED ORDER — FENTANYL CITRATE (PF) 100 MCG/2ML IJ SOLN
INTRAMUSCULAR | Status: AC
  Filled 2022-04-28: qty 2

## 2022-04-28 MED ORDER — SODIUM CHLORIDE 0.9% FLUSH: 3.0000 mL | Freq: Two times a day (BID) | INTRAVENOUS | Status: DC

## 2022-04-28 MED ORDER — MIDAZOLAM HCL 2 MG/2ML IJ SOLN
INTRAMUSCULAR | Status: DC | PRN
  Administered 2022-04-28: 2 mg via INTRAVENOUS

## 2022-04-28 MED ORDER — VERAPAMIL HCL 2.5 MG/ML IV SOLN
INTRAVENOUS | Status: DC | PRN
  Administered 2022-04-28: 10 mL via INTRA_ARTERIAL

## 2022-04-28 MED ORDER — VERAPAMIL HCL 2.5 MG/ML IV SOLN
INTRAVENOUS | Status: AC
  Filled 2022-04-28: qty 2

## 2022-04-28 MED ORDER — FENTANYL CITRATE (PF) 100 MCG/2ML IJ SOLN
INTRAMUSCULAR | Status: DC | PRN
  Administered 2022-04-28: 25 ug via INTRAVENOUS

## 2022-04-28 MED ORDER — LABETALOL HCL 5 MG/ML IV SOLN: 10.0000 mg | INTRAVENOUS | Status: DC | PRN

## 2022-04-28 SURGICAL SUPPLY — 16 items
BAND ZEPHYR COMPRESS 30 LONG (HEMOSTASIS) IMPLANT
CATH EXPO 5F MPA-1 (CATHETERS) IMPLANT
CATH INFINITI JR4 5F (CATHETERS) IMPLANT
CATH LAUNCHER 6FR EBU3.5 (CATHETERS) IMPLANT
CATH OPTITORQUE TIG 4.0 5F (CATHETERS) IMPLANT
GLIDESHEATH SLEND SS 6F .021 (SHEATH) IMPLANT
GUIDEWIRE INQWIRE 1.5J.035X260 (WIRE) IMPLANT
GUIDEWIRE PRESSURE X 175 (WIRE) IMPLANT
INQWIRE 1.5J .035X260CM (WIRE) ×1
KIT ESSENTIALS PG (KITS) IMPLANT
KIT HEART LEFT (KITS) ×1 IMPLANT
PACK CARDIAC CATHETERIZATION (CUSTOM PROCEDURE TRAY) ×1 IMPLANT
SHEATH PROBE COVER 6X72 (BAG) IMPLANT
SYR MEDRAD MARK 7 150ML (SYRINGE) ×1 IMPLANT
TRANSDUCER W/STOPCOCK (MISCELLANEOUS) ×1 IMPLANT
TUBING CIL FLEX 10 FLL-RA (TUBING) ×1 IMPLANT

## 2022-04-28 NOTE — Progress Notes (Signed)
TR band removed, no s/s of hematoma or bleeding noted.

## 2022-04-28 NOTE — Interval H&P Note (Signed)
History and Physical Interval Note:  04/28/2022 9:20 AM  [name]  has presented today for surgery, with the diagnosis of unstable agina , abnormal pet scan.  The various methods of treatment have been discussed with the patient and family. After consideration of risks, benefits and other options for treatment, the patient has consented to  Procedure(s): LEFT HEART CATH AND CORONARY ANGIOGRAPHY (N/A)  PERCUTANEOUS CORONARY INTERVENTION  as a surgical intervention.  The patient's history has been reviewed, patient examined, no change in status, stable for surgery.  I have reviewed the patient's chart and labs.  Questions were answered to the patient's satisfaction.    Cath Lab Visit (complete for each Cath Lab visit)  Clinical Evaluation Leading to the Procedure:   ACS: No.  Non-ACS:    Anginal Classification: CCS II  Anti-ischemic medical therapy: Minimal Therapy (1 class of medications)  Non-Invasive Test Results: Equivocal test results -> nonischemic findings on Myoview 2 years ago and on PET/CT however mildly abnormal coronary flow reserve with ongoing symptoms of fatigue.  Prior CABG: No previous CABG   Glenetta Hew

## 2022-04-29 ENCOUNTER — Encounter: Payer: Self-pay | Admitting: Internal Medicine

## 2022-04-29 ENCOUNTER — Telehealth: Payer: Self-pay | Admitting: Internal Medicine

## 2022-04-29 ENCOUNTER — Encounter (HOSPITAL_COMMUNITY): Payer: Self-pay | Admitting: Cardiology

## 2022-04-29 NOTE — Telephone Encounter (Signed)
Patient called and said that jury duty has the wrong date on it. Please call back to discuss

## 2022-04-29 NOTE — Telephone Encounter (Signed)
Letter for the pts jury duty summons excuse for 04/28/22 has been sent to his My Chart.

## 2022-04-29 NOTE — Telephone Encounter (Signed)
REviewed with patient    Keep eye on it.    Avoid lifting for now  Keep track of BP On amlodipine    May increase

## 2022-04-29 NOTE — Telephone Encounter (Signed)
Done... Jury Duty letter changed for 04/29/22.

## 2022-04-29 NOTE — Telephone Encounter (Signed)
Pt called needing a providers excuse letter for the courts.  He was summoned for [place] duty, but had a Heart Cath with Dr. Glenetta Hew on 04/28/2022... the same day of jury duty.    He is a patient of Dr. Dorris Carnes.  Pt was told that we will forward a letter from his [place] provider through [place], for him to give to the courts as "proof" of his heart cath yesterday.    Dr. Alan Ripper RN messaged.  Follow up  excuse letter needed.

## 2022-04-29 NOTE — Telephone Encounter (Signed)
Patient states he had a heart cath yesterday. He says he had jury summons to appear this morning and they said if he got a doctors note he would be excused.

## 2022-04-29 NOTE — Telephone Encounter (Signed)
Error, chart review not needed

## 2022-05-03 DIAGNOSIS — G4733 Obstructive sleep apnea (adult) (pediatric): Secondary | ICD-10-CM | POA: Diagnosis not present

## 2022-05-12 ENCOUNTER — Other Ambulatory Visit: Payer: Self-pay | Admitting: Thoracic Surgery (Cardiothoracic Vascular Surgery)

## 2022-05-12 DIAGNOSIS — I7121 Aneurysm of the ascending aorta, without rupture: Secondary | ICD-10-CM

## 2022-05-13 DIAGNOSIS — R69 Illness, unspecified: Secondary | ICD-10-CM | POA: Diagnosis not present

## 2022-05-13 DIAGNOSIS — F410 Panic disorder [episodic paroxysmal anxiety] without agoraphobia: Secondary | ICD-10-CM | POA: Diagnosis not present

## 2022-05-13 DIAGNOSIS — F332 Major depressive disorder, recurrent severe without psychotic features: Secondary | ICD-10-CM | POA: Diagnosis not present

## 2022-05-18 ENCOUNTER — Other Ambulatory Visit: Payer: Self-pay | Admitting: Internal Medicine

## 2022-05-18 DIAGNOSIS — F332 Major depressive disorder, recurrent severe without psychotic features: Secondary | ICD-10-CM | POA: Diagnosis not present

## 2022-05-18 DIAGNOSIS — F410 Panic disorder [episodic paroxysmal anxiety] without agoraphobia: Secondary | ICD-10-CM | POA: Diagnosis not present

## 2022-05-18 DIAGNOSIS — R69 Illness, unspecified: Secondary | ICD-10-CM | POA: Diagnosis not present

## 2022-05-20 ENCOUNTER — Other Ambulatory Visit: Payer: Self-pay

## 2022-05-20 ENCOUNTER — Encounter: Payer: Self-pay | Admitting: Internal Medicine

## 2022-05-20 ENCOUNTER — Encounter: Payer: Self-pay | Admitting: Neurology

## 2022-05-20 MED ORDER — EZETIMIBE 10 MG PO TABS: 10.0000 mg | ORAL_TABLET | Freq: Every day | ORAL | 3 refills | Status: DC

## 2022-05-21 ENCOUNTER — Ambulatory Visit: Payer: Medicare HMO | Admitting: Adult Health

## 2022-05-25 ENCOUNTER — Telehealth: Payer: Self-pay | Admitting: *Deleted

## 2022-05-25 NOTE — Telephone Encounter (Signed)
Patient contacted the office stating he is going to undergo [place] for his depression and is wanting to verify for contraindications in regards to his TAA. Per Dr. Roxan Hockey, advised patient that it should not effect his TAA. Advised patient to clarify contraindications with [place] performing MD. Patient verbalized understanding.

## 2022-05-27 DIAGNOSIS — R69 Illness, unspecified: Secondary | ICD-10-CM | POA: Diagnosis not present

## 2022-05-27 DIAGNOSIS — F332 Major depressive disorder, recurrent severe without psychotic features: Secondary | ICD-10-CM | POA: Diagnosis not present

## 2022-06-03 DIAGNOSIS — G4733 Obstructive sleep apnea (adult) (pediatric): Secondary | ICD-10-CM | POA: Diagnosis not present

## 2022-06-05 DIAGNOSIS — F332 Major depressive disorder, recurrent severe without psychotic features: Secondary | ICD-10-CM | POA: Diagnosis not present

## 2022-06-05 DIAGNOSIS — R69 Illness, unspecified: Secondary | ICD-10-CM | POA: Diagnosis not present

## 2022-06-08 DIAGNOSIS — F332 Major depressive disorder, recurrent severe without psychotic features: Secondary | ICD-10-CM | POA: Diagnosis not present

## 2022-06-08 DIAGNOSIS — R69 Illness, unspecified: Secondary | ICD-10-CM | POA: Diagnosis not present

## 2022-06-08 NOTE — Progress Notes (Unsigned)
Cardiology Office Note   Date:  06/09/2022   ID:  Eugene Guzman, DOB [birth year], MRN [id number]  PCP:  Isaac Bliss, Rayford Halsted, MD  Cardiologist:   Dorris Carnes, MD   Patient presents for follow up of CAD    History of Present Illness: Eugene Guzman is a 66 y.o. male  CAD.  Pt has a calcium score CT in 2021  score was 2118 (97th percentile for age)   Calcificatons noted in all coronary arteries.   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   he has been followed by Leonarda Salon with serial CT exams    The pt is also followed by psych for anxiety, depression  I saw the pt in Aug    He noted some fatigue at the tme,   he and his wife questioned if it was due to his heart   Denied CP He went on to have a PET/CT   This  showed normal perfuision     LVEF normal  No TID    Flow reserve was down a little 1.88    I have reviewed with pt and his wife   Given this abnormal response and the severity of his coronary calcifications I would recomm L heart cath to define anatomy     L heart cath showed   This showed moderae CAD (60% LAD with FFR being insignificant)  LVEF normal   LVEDP normal    Pt on amldipoine already  Today he returns   He denies CP   Thinks his energy may be a little better       He has started [place] for depression     Current Meds  Medication Sig   amLODipine (NORVASC) 2.5 MG tablet Take 1 tablet (2.5 mg total) by mouth daily.   aspirin EC 81 MG tablet Take 1 tablet (81 mg total) by mouth daily. Swallow whole.   ezetimibe (ZETIA) 10 MG tablet Take 1 tablet (10 mg total) by mouth daily.   finasteride (PROSCAR) 5 MG tablet Take 5 mg by mouth daily.   imipramine (TOFRANIL) 25 MG tablet Take 100 mg by mouth at bedtime.   lamoTRIgine (LAMICTAL) 25 MG tablet Take 25 mg by mouth at bedtime.   LORazepam (ATIVAN) 0.5 MG tablet Take 0.5 mg by mouth as directed. 0.5 mg at bedtime plus  as needed during day   multivitamin (ONE-A-DAY MEN'S) TABS tablet Take 1 tablet by mouth daily.   rosuvastatin (CRESTOR) 20 MG tablet Take 1 tablet (20 mg total) by mouth daily.   Transcran Magnetic Stimulator DEVI      Allergies:   Penicillins and Latex   Past Medical History:  Diagnosis Date   Anxiety    Aortic aneurysm (HCC)    Coronary artery calcification seen on CT scan    Depressed    Hypertension     Past Surgical History:  Procedure Laterality Date   INTRAVASCULAR PRESSURE WIRE/FFR STUDY N/A 04/28/2022   Procedure: INTRAVASCULAR PRESSURE WIRE/FFR STUDY;  Surgeon: Leonie Man, MD;  Location: [facility];  Service: Cardiovascular;  Laterality: N/A;   LEFT HEART CATH AND CORONARY ANGIOGRAPHY N/A 04/28/2022   Procedure: LEFT HEART CATH AND CORONARY ANGIOGRAPHY;  Surgeon: Leonie Man, MD;  Location: [facility];  Service: Cardiovascular;  Laterality: N/A;  TONSILLECTOMY  1961     Social History:  The patient  reports that he has quit smoking. His smoking use included cigars. He has never used smokeless tobacco. He reports current alcohol use of about 10.0 standard drinks of alcohol per week. He reports that he does not use drugs.   Family History:  The patient's family history includes Colon cancer in his paternal grandfather; Heart attack in his father; Hypertension in his mother; Ovarian cancer in his maternal grandmother.    ROS:  Please see the history of present illness. All other systems are reviewed and  Negative to the above problem except as noted.    PHYSICAL EXAM: VS:  BP 120/76   Pulse (!) 58   Ht '5\' 10"'$  (1.778 m)   Wt 159 lb 3.2 oz (72.2 kg)   SpO2 95%   BMI 22.84 kg/m   GEN:  Thin 66 yo in no acute distress  HEENT: normal  Neck: no JVD, no carotid bruit Cardiac: RRR; no murmur  No LE edema  Respiratory:  clear to auscultation  GI: soft, nontender, nondistended, + BS  No hepatomegaly  MS: no deformity Moving all extremities    Skin: warm and dry, no rash Neuro:  Strength and sensation are intact Psych: euthymic mood, full affect   EKG:  EKG not done today   Cardiac Cath   04/28/22  POST-OP DIAGNOSIS Correct findings on stress PET: No obstructive disease Moderate multivessel CAD, calcified: Mid LAD 60% (RFR 0.94-not significant, apical LAD RFR was still not significant-0.92.), ostial LCx 55%, mid RCA 55%. Normal LV function, normal LVEDP     RECOMMENDATIONS Continue to titrate medications for cardiac risk factor reduction Consider increasing calcium channel blocker for likely microvascular disease based on stress PET results.     Lipid Panel   Last lipds in May LDL 134  HDL 67  Trig 141   LDL in 2010 was 172      Component Value Date/Time   CHOL 166 05/06/2021 1136   CHOL 160 03/04/2021 1117   TRIG 52.0 05/06/2021 1136   HDL 83.40 05/06/2021 1136   HDL 92 03/04/2021 1117   CHOLHDL 2 05/06/2021 1136   VLDL 10.4 05/06/2021 1136   LDLCALC 72 05/06/2021 1136   LDLCALC 54 03/04/2021 1117      Wt Readings from Last 3 Encounters:  06/09/22 159 lb 3.2 oz (72.2 kg)  04/28/22 155 lb (70.3 kg)  04/23/22 156 lb 9.6 oz (71 kg)      ASSESSMENT AND PLAN:   1  CAD   Pt with signifcant elevated Ca score    Now s/p LHC which showed moderate CAD  Would continue ASA   keep on amldipine    Keep dose the same for now      2   Thoracic aneurysm   Follows with S Hendrickson  has appt for CT in next couple weeks    3  HTN   BP is well controlled     4  HL    Excellent control  LDL 49  HDL 74   Continue meds    I will set for follow up in the summer     Limit sugar and processed foods      Stay active   Current medicines are reviewed at length with the patient today.  The patient does not have concerns regarding medicines.  Signed, Dorris Carnes, MD  06/09/2022 12:12 PM    [facility]  [address] [place], [place]  [address] Phone: [phone]; Fax: [phone]

## 2022-06-09 ENCOUNTER — Ambulatory Visit: Payer: Medicare HMO | Attending: Internal Medicine | Admitting: Internal Medicine

## 2022-06-09 ENCOUNTER — Encounter: Payer: Self-pay | Admitting: Internal Medicine

## 2022-06-09 VITALS — BP 120/76 | HR 58 | Ht 70.0 in | Wt 159.2 lb

## 2022-06-09 DIAGNOSIS — R69 Illness, unspecified: Secondary | ICD-10-CM | POA: Diagnosis not present

## 2022-06-09 DIAGNOSIS — F332 Major depressive disorder, recurrent severe without psychotic features: Secondary | ICD-10-CM | POA: Diagnosis not present

## 2022-06-09 DIAGNOSIS — I2510 Atherosclerotic heart disease of native coronary artery without angina pectoris: Secondary | ICD-10-CM | POA: Diagnosis not present

## 2022-06-09 NOTE — Patient Instructions (Signed)
Medication Instructions:   *If you need a refill on your cardiac medications before your next appointment, please call your pharmacy*   Lab Work:  If you have labs (blood work) drawn today and your tests are completely normal, you will receive your results only by: [place] (if you have MyChart) OR A paper copy in the mail If you have any lab test that is abnormal or we need to change your treatment, we will call you to review the results.   Testing/Procedures:    Follow-Up: At [facility], you and your health needs are our priority.  As part of our continuing mission to provide you with exceptional heart care, we have created designated Provider Care Teams.  These Care Teams include your primary Cardiologist (physician) and Advanced Practice Providers (APPs -  Physician Assistants and Nurse Practitioners) who all work together to provide you with the care you need, when you need it.  We recommend signing up for the patient portal called "MyChart".  Sign up information is provided on this After Visit Summary.  MyChart is used to connect with patients for Virtual Visits (Telemedicine).  Patients are able to view lab/test results, encounter notes, upcoming appointments, etc.  Non-urgent messages can be sent to your provider as well.   To learn more about what you can do with MyChart, go to [email or web address].    Your next appointment:   6 month(s)  The format for your next appointment:   In Person  Provider:   Dorris Carnes, MD     Other Instructions   Important Information About Sugar

## 2022-06-10 DIAGNOSIS — F332 Major depressive disorder, recurrent severe without psychotic features: Secondary | ICD-10-CM | POA: Diagnosis not present

## 2022-06-10 DIAGNOSIS — R69 Illness, unspecified: Secondary | ICD-10-CM | POA: Diagnosis not present

## 2022-06-11 DIAGNOSIS — F332 Major depressive disorder, recurrent severe without psychotic features: Secondary | ICD-10-CM | POA: Diagnosis not present

## 2022-06-11 DIAGNOSIS — R69 Illness, unspecified: Secondary | ICD-10-CM | POA: Diagnosis not present

## 2022-06-12 DIAGNOSIS — R69 Illness, unspecified: Secondary | ICD-10-CM | POA: Diagnosis not present

## 2022-06-12 DIAGNOSIS — F332 Major depressive disorder, recurrent severe without psychotic features: Secondary | ICD-10-CM | POA: Diagnosis not present

## 2022-06-15 DIAGNOSIS — R69 Illness, unspecified: Secondary | ICD-10-CM | POA: Diagnosis not present

## 2022-06-15 DIAGNOSIS — F332 Major depressive disorder, recurrent severe without psychotic features: Secondary | ICD-10-CM | POA: Diagnosis not present

## 2022-06-16 ENCOUNTER — Other Ambulatory Visit (HOSPITAL_COMMUNITY): Payer: Medicare HMO

## 2022-06-16 DIAGNOSIS — R69 Illness, unspecified: Secondary | ICD-10-CM | POA: Diagnosis not present

## 2022-06-16 DIAGNOSIS — F332 Major depressive disorder, recurrent severe without psychotic features: Secondary | ICD-10-CM | POA: Diagnosis not present

## 2022-06-16 DIAGNOSIS — F410 Panic disorder [episodic paroxysmal anxiety] without agoraphobia: Secondary | ICD-10-CM | POA: Diagnosis not present

## 2022-06-18 DIAGNOSIS — R69 Illness, unspecified: Secondary | ICD-10-CM | POA: Diagnosis not present

## 2022-06-18 DIAGNOSIS — F332 Major depressive disorder, recurrent severe without psychotic features: Secondary | ICD-10-CM | POA: Diagnosis not present

## 2022-06-19 DIAGNOSIS — R69 Illness, unspecified: Secondary | ICD-10-CM | POA: Diagnosis not present

## 2022-06-19 DIAGNOSIS — F332 Major depressive disorder, recurrent severe without psychotic features: Secondary | ICD-10-CM | POA: Diagnosis not present

## 2022-06-22 ENCOUNTER — Ambulatory Visit
Admission: RE | Admit: 2022-06-22 | Discharge: 2022-06-22 | Disposition: A | Discharge status: Home / Self Care | Payer: Medicare HMO | Source: Ambulatory Visit | Attending: Thoracic Surgery (Cardiothoracic Vascular Surgery) | Admitting: Thoracic Surgery (Cardiothoracic Vascular Surgery)

## 2022-06-22 DIAGNOSIS — R69 Illness, unspecified: Secondary | ICD-10-CM | POA: Diagnosis not present

## 2022-06-22 DIAGNOSIS — F332 Major depressive disorder, recurrent severe without psychotic features: Secondary | ICD-10-CM | POA: Diagnosis not present

## 2022-06-22 DIAGNOSIS — I7121 Aneurysm of the ascending aorta, without rupture: Secondary | ICD-10-CM

## 2022-06-22 DIAGNOSIS — I712 Thoracic aortic aneurysm, without rupture, unspecified: Secondary | ICD-10-CM | POA: Diagnosis not present

## 2022-06-22 MED ORDER — IOPAMIDOL (ISOVUE-370) INJECTION 76%
150.0000 mL | Freq: Once | INTRAVENOUS | Status: AC | PRN
  Administered 2022-06-22: 150 mL via INTRAVENOUS

## 2022-06-23 DIAGNOSIS — R69 Illness, unspecified: Secondary | ICD-10-CM | POA: Diagnosis not present

## 2022-06-23 DIAGNOSIS — F332 Major depressive disorder, recurrent severe without psychotic features: Secondary | ICD-10-CM | POA: Diagnosis not present

## 2022-06-24 DIAGNOSIS — F332 Major depressive disorder, recurrent severe without psychotic features: Secondary | ICD-10-CM | POA: Diagnosis not present

## 2022-06-24 DIAGNOSIS — R69 Illness, unspecified: Secondary | ICD-10-CM | POA: Diagnosis not present

## 2022-06-25 ENCOUNTER — Ambulatory Visit (INDEPENDENT_AMBULATORY_CARE_PROVIDER_SITE_OTHER): Payer: Medicare HMO | Admitting: Physician Assistant

## 2022-06-25 ENCOUNTER — Encounter: Payer: Self-pay | Admitting: Physician Assistant

## 2022-06-25 VITALS — BP 155/79 | HR 66 | Resp 20 | Ht 70.0 in | Wt 159.0 lb

## 2022-06-25 DIAGNOSIS — I7121 Aneurysm of the ascending aorta, without rupture: Secondary | ICD-10-CM

## 2022-06-25 DIAGNOSIS — Q231 Congenital insufficiency of aortic valve: Secondary | ICD-10-CM

## 2022-06-25 DIAGNOSIS — F332 Major depressive disorder, recurrent severe without psychotic features: Secondary | ICD-10-CM | POA: Diagnosis not present

## 2022-06-25 DIAGNOSIS — R69 Illness, unspecified: Secondary | ICD-10-CM | POA: Diagnosis not present

## 2022-06-25 NOTE — Patient Instructions (Signed)
Continue to monitor your blood pressure with target of less than 140/90.  No change in medications from CT surgery standpoint  Follow-up in 6 months with CTA chest

## 2022-06-25 NOTE — Progress Notes (Signed)
[address]       [place] [address]             [phone]      PCP is Isaac Bliss, Rayford Halsted, MD Referring Provider is Fay Records, MD  Chief Complaint  Patient presents with   Thoracic Aortic Aneurysm    6 month f/u with CTA Chest and ECHO         HPI: Eugene Guzman is a 65 year old male with a past medical history of bicuspid aortic valve, hypertension, former cigar use,anxiety, depression, coronary calcifications, sleep apnea and dyslipidemia who has been followed by our office for an ascending thoracic aortic aneurysm. He was last seen in June of this year and the aneurysm was stable at 4.5 cm.  Since that visit he has undergone additional work up for evaluation of his coronary calcifications.  PET CT scan showed normal myocardial perfusion and normal left ventricular function.  He went on to have left heart catheterization moderate three-vessel coronary artery disease with a 60% LAD stenosis and 55% lesions in both the proximal circumflex and RCA. FFR did not support this is being a hemodynamically significant stenosis.  He has been followed closely by cardiology.  He has stopped smoking.  His most recent echocardiogram obtained in January of this year confirmed a bicuspid aortic valve with mild valvular calcification.  He has mild aortic insufficiency but no significant aortic stenosis. Eugene Guzman returns today for scheduled 78-monthfollow-up and repeat CTA of the chest days ago. Mr. NSmeltzercontinues to feel well.  He denies any new chest pain or shortness of breath.       Past Medical History:  Diagnosis Date   Anxiety    Aortic aneurysm ([facility]    Coronary artery calcification seen on CT scan    Depressed    Hypertension     Past Surgical History:  Procedure Laterality Date   INTRAVASCULAR PRESSURE WIRE/FFR STUDY N/A 04/28/2022   Procedure: INTRAVASCULAR PRESSURE WIRE/FFR STUDY;  Surgeon: HLeonie Man MD;  [facility];   Service: Cardiovascular;  Laterality: N/A;   LEFT HEART CATH AND CORONARY ANGIOGRAPHY N/A 04/28/2022   Procedure: LEFT HEART CATH AND CORONARY ANGIOGRAPHY;  Surgeon: HLeonie Man MD;  [facility];  Service: Cardiovascular;  Laterality: N/A;   TONSILLECTOMY  1961    Family History  Problem Relation Age of Onset   Hypertension Mother    Heart attack Father    Ovarian cancer Maternal Grandmother    Colon cancer Paternal Grandfather     Social History Social History   Tobacco Use   Smoking status: Former    Types: Cigars   Smokeless tobacco: Never  Substance Use Topics   Alcohol use: Yes    Alcohol/week: 10.0 standard drinks of alcohol    Types: 10 Cans of beer per week    Comment: beer occasionally   Drug use: No    Current Outpatient Medications  Medication Sig Dispense Refill   amLODipine (NORVASC) 2.5 MG tablet Take 1 tablet (2.5 mg total) by mouth daily. 90 tablet 3   aspirin EC 81 MG tablet Take 1 tablet (81 mg total) by mouth daily. Swallow whole. 90 tablet 3   ezetimibe (ZETIA) 10 MG tablet Take 1 tablet (10 mg total) by mouth daily. 90 tablet 3   finasteride (PROSCAR) 5 MG tablet Take 5 mg by mouth daily.     imipramine (  TOFRANIL) 25 MG tablet Take 100 mg by mouth at bedtime.     lamoTRIgine (LAMICTAL) 25 MG tablet Take 25 mg by mouth at bedtime.     LORazepam (ATIVAN) 0.5 MG tablet Take 0.5 mg by mouth as directed. 0.5 mg at bedtime plus as needed during day     multivitamin (ONE-A-DAY MEN'S) TABS tablet Take 1 tablet by mouth daily.     rosuvastatin (CRESTOR) 20 MG tablet Take 1 tablet (20 mg total) by mouth daily. 90 tablet 3   Transcran Magnetic Stimulator DEVI      No current facility-administered medications for this visit.    Allergies  Allergen Reactions   Penicillins Other (See Comments)    Childhood   Latex Rash     Physical Exam: Vital signs BP 155/79 Pulse 66 Respirations 20 SpO2 100% on room air Weight 159 pounds (this  is a 13 pound weight loss since last visit)  Diagnostic Tests: CLINICAL DATA:  Thoracic aorta disease, preop planning. Thoracic aortic aneurysm follow-up.   EXAM: CT ANGIOGRAPHY CHEST WITH CONTRAST   TECHNIQUE: Multidetector CT imaging of the chest was performed using the standard protocol during bolus administration of intravenous contrast. Multiplanar CT image reconstructions and MIPs were obtained to evaluate the vascular anatomy.   RADIATION DOSE REDUCTION: This exam was performed according to the departmental dose-optimization program which includes automated exposure control, adjustment of the mA and/or kV according to patient size and/or use of iterative reconstruction technique.   CONTRAST:  143m ISOVUE-370 IOPAMIDOL (ISOVUE-370) INJECTION 76%   COMPARISON:  Chest CT angiogram dated 12/23/2021.   FINDINGS: Cardiovascular: Aneurysm of the ascending thoracic aorta, measuring 4.5 x 4.5 cm, stable compared to the measurements on chest CT angiogram of 12/23/2021 and not significantly changed compared to the earlier chest CT angiogram of 06/24/2020.   Thoracic aortic arch and descending thoracic aorta are normal in caliber. Scattered aortic atherosclerosis. No aortic dissection.   Three-vessel coronary artery calcifications, particularly dense within the LEFT anterior descending coronary artery. No pericardial effusion.   Mediastinum/Nodes: No mass or enlarged lymph nodes are seen within the mediastinum or perihilar regions. Esophagus appears normal. Trachea and central bronchi are unremarkable.   Lungs/Pleura: Lungs are clear.  No pleural effusion or pneumothorax.   Upper Abdomen: Limited images of the upper abdomen are unremarkable.   Musculoskeletal: No acute-appearing osseous abnormality. Old healed fractures of the LEFT lower ribs.   Review of the MIP images confirms the above findings.   IMPRESSION: 1. Aneurysm of the ascending thoracic aorta, measuring 4.5  x 4.5 cm, stable compared to the measurements on chest CT angiogram of 12/23/2021 and not significantly changed compared to the earlier chest CT angiogram of 06/24/2020. Recommend semi-annual imaging followup by CTA or MRA and referral to cardiothoracic surgery if not already obtained. This recommendation follows 2010 ACCF/AHA/AATS/ACR/ASA/SCA/SCAI/SIR/STS/SVM Guidelines for the Diagnosis and Management of Patients With Thoracic Aortic Disease. Circulation. 2010; 121:: L798-X211 Aortic aneurysm NOS (ICD10-I71.9) 2. Three-vessel coronary artery calcifications, particularly dense within the LEFT anterior descending coronary artery. 3. No acute findings. Lungs are clear.     Electronically Signed   By: SFranki CabotM.D.   On: 06/22/2022 11:28  Impression / Plan: 66year old gentleman history of hypertension, moderate coronary artery disease not felt to be symptomatic, bi-cuspid aortic valve without significant AI or stenosis, dyslipidemia, anxiety and depression known 4.5 cm thoracic aortic aneurysm identified on coronary CT scanning in 2021.  This aneurysm remains stable no significant change was first diagnosed.  He is being followed closely by cardiology for recent evaluation of his coronary disease.  He is on statin therapy and amlodipine.  He has recently stopped smoking.  Continued surveillance 6 months is recommended ascending aortic aneurysm remains well below threshold for requiring surgical intervention at this time. Will follow-up with CTA chest in 6 months.   Antony Odea, PA-C [facility] [phone]

## 2022-06-26 DIAGNOSIS — F332 Major depressive disorder, recurrent severe without psychotic features: Secondary | ICD-10-CM | POA: Diagnosis not present

## 2022-06-26 DIAGNOSIS — R69 Illness, unspecified: Secondary | ICD-10-CM | POA: Diagnosis not present

## 2022-06-30 DIAGNOSIS — F332 Major depressive disorder, recurrent severe without psychotic features: Secondary | ICD-10-CM | POA: Diagnosis not present

## 2022-06-30 DIAGNOSIS — R69 Illness, unspecified: Secondary | ICD-10-CM | POA: Diagnosis not present

## 2022-07-01 DIAGNOSIS — R69 Illness, unspecified: Secondary | ICD-10-CM | POA: Diagnosis not present

## 2022-07-01 DIAGNOSIS — F332 Major depressive disorder, recurrent severe without psychotic features: Secondary | ICD-10-CM | POA: Diagnosis not present

## 2022-07-02 DIAGNOSIS — F332 Major depressive disorder, recurrent severe without psychotic features: Secondary | ICD-10-CM | POA: Diagnosis not present

## 2022-07-02 DIAGNOSIS — R69 Illness, unspecified: Secondary | ICD-10-CM | POA: Diagnosis not present

## 2022-07-03 DIAGNOSIS — F332 Major depressive disorder, recurrent severe without psychotic features: Secondary | ICD-10-CM | POA: Diagnosis not present

## 2022-07-03 DIAGNOSIS — G4733 Obstructive sleep apnea (adult) (pediatric): Secondary | ICD-10-CM | POA: Diagnosis not present

## 2022-07-03 DIAGNOSIS — R69 Illness, unspecified: Secondary | ICD-10-CM | POA: Diagnosis not present

## 2022-07-06 DIAGNOSIS — C9110 Chronic lymphocytic leukemia of B-cell type not having achieved remission: Secondary | ICD-10-CM | POA: Insufficient documentation

## 2022-07-07 DIAGNOSIS — F332 Major depressive disorder, recurrent severe without psychotic features: Secondary | ICD-10-CM | POA: Diagnosis not present

## 2022-07-07 DIAGNOSIS — R69 Illness, unspecified: Secondary | ICD-10-CM | POA: Diagnosis not present

## 2022-07-08 DIAGNOSIS — R69 Illness, unspecified: Secondary | ICD-10-CM | POA: Diagnosis not present

## 2022-07-08 DIAGNOSIS — F332 Major depressive disorder, recurrent severe without psychotic features: Secondary | ICD-10-CM | POA: Diagnosis not present

## 2022-07-09 DIAGNOSIS — F332 Major depressive disorder, recurrent severe without psychotic features: Secondary | ICD-10-CM | POA: Diagnosis not present

## 2022-07-09 DIAGNOSIS — R69 Illness, unspecified: Secondary | ICD-10-CM | POA: Diagnosis not present

## 2022-07-10 DIAGNOSIS — F332 Major depressive disorder, recurrent severe without psychotic features: Secondary | ICD-10-CM | POA: Diagnosis not present

## 2022-07-10 DIAGNOSIS — R69 Illness, unspecified: Secondary | ICD-10-CM | POA: Diagnosis not present

## 2022-07-13 DIAGNOSIS — F332 Major depressive disorder, recurrent severe without psychotic features: Secondary | ICD-10-CM | POA: Diagnosis not present

## 2022-07-13 DIAGNOSIS — R69 Illness, unspecified: Secondary | ICD-10-CM | POA: Diagnosis not present

## 2022-07-13 DIAGNOSIS — F410 Panic disorder [episodic paroxysmal anxiety] without agoraphobia: Secondary | ICD-10-CM | POA: Diagnosis not present

## 2022-07-15 DIAGNOSIS — F332 Major depressive disorder, recurrent severe without psychotic features: Secondary | ICD-10-CM | POA: Diagnosis not present

## 2022-07-15 DIAGNOSIS — R69 Illness, unspecified: Secondary | ICD-10-CM | POA: Diagnosis not present

## 2022-07-16 DIAGNOSIS — R69 Illness, unspecified: Secondary | ICD-10-CM | POA: Diagnosis not present

## 2022-07-16 DIAGNOSIS — F410 Panic disorder [episodic paroxysmal anxiety] without agoraphobia: Secondary | ICD-10-CM | POA: Diagnosis not present

## 2022-07-16 DIAGNOSIS — Z79899 Other long term (current) drug therapy: Secondary | ICD-10-CM | POA: Diagnosis not present

## 2022-07-16 DIAGNOSIS — F341 Dysthymic disorder: Secondary | ICD-10-CM | POA: Diagnosis not present

## 2022-07-16 DIAGNOSIS — F332 Major depressive disorder, recurrent severe without psychotic features: Secondary | ICD-10-CM | POA: Diagnosis not present

## 2022-07-17 DIAGNOSIS — F332 Major depressive disorder, recurrent severe without psychotic features: Secondary | ICD-10-CM | POA: Diagnosis not present

## 2022-07-17 DIAGNOSIS — R69 Illness, unspecified: Secondary | ICD-10-CM | POA: Diagnosis not present

## 2022-07-20 ENCOUNTER — Ambulatory Visit: Payer: Medicare HMO | Admitting: Adult Health

## 2022-07-21 DIAGNOSIS — R69 Illness, unspecified: Secondary | ICD-10-CM | POA: Diagnosis not present

## 2022-07-21 DIAGNOSIS — F332 Major depressive disorder, recurrent severe without psychotic features: Secondary | ICD-10-CM | POA: Diagnosis not present

## 2022-07-26 DIAGNOSIS — R509 Fever, unspecified: Secondary | ICD-10-CM | POA: Diagnosis not present

## 2022-07-26 DIAGNOSIS — U071 COVID-19: Secondary | ICD-10-CM | POA: Diagnosis not present

## 2022-08-03 DIAGNOSIS — G4733 Obstructive sleep apnea (adult) (pediatric): Secondary | ICD-10-CM | POA: Diagnosis not present

## 2022-08-04 DIAGNOSIS — F332 Major depressive disorder, recurrent severe without psychotic features: Secondary | ICD-10-CM | POA: Diagnosis not present

## 2022-08-04 DIAGNOSIS — R69 Illness, unspecified: Secondary | ICD-10-CM | POA: Diagnosis not present

## 2022-08-05 DIAGNOSIS — R69 Illness, unspecified: Secondary | ICD-10-CM | POA: Diagnosis not present

## 2022-08-05 DIAGNOSIS — F332 Major depressive disorder, recurrent severe without psychotic features: Secondary | ICD-10-CM | POA: Diagnosis not present

## 2022-08-06 ENCOUNTER — Telehealth: Payer: Self-pay | Admitting: Internal Medicine

## 2022-08-06 NOTE — Telephone Encounter (Signed)
Caller states patient wants to transfer care from Dr. Isaac Bliss to Dr. Cherlynn Kaiser. Is this okay?   If so, Caller wants to know if CPE could be done at the time of TOC visit.

## 2022-08-07 DIAGNOSIS — F332 Major depressive disorder, recurrent severe without psychotic features: Secondary | ICD-10-CM | POA: Diagnosis not present

## 2022-08-07 DIAGNOSIS — R69 Illness, unspecified: Secondary | ICD-10-CM | POA: Diagnosis not present

## 2022-08-10 DIAGNOSIS — F332 Major depressive disorder, recurrent severe without psychotic features: Secondary | ICD-10-CM | POA: Diagnosis not present

## 2022-08-10 DIAGNOSIS — R69 Illness, unspecified: Secondary | ICD-10-CM | POA: Diagnosis not present

## 2022-08-11 DIAGNOSIS — F332 Major depressive disorder, recurrent severe without psychotic features: Secondary | ICD-10-CM | POA: Diagnosis not present

## 2022-08-11 DIAGNOSIS — R69 Illness, unspecified: Secondary | ICD-10-CM | POA: Diagnosis not present

## 2022-08-13 DIAGNOSIS — R69 Illness, unspecified: Secondary | ICD-10-CM | POA: Diagnosis not present

## 2022-08-13 DIAGNOSIS — F332 Major depressive disorder, recurrent severe without psychotic features: Secondary | ICD-10-CM | POA: Diagnosis not present

## 2022-08-17 DIAGNOSIS — R69 Illness, unspecified: Secondary | ICD-10-CM | POA: Diagnosis not present

## 2022-08-17 DIAGNOSIS — F332 Major depressive disorder, recurrent severe without psychotic features: Secondary | ICD-10-CM | POA: Diagnosis not present

## 2022-08-19 DIAGNOSIS — R69 Illness, unspecified: Secondary | ICD-10-CM | POA: Diagnosis not present

## 2022-08-19 DIAGNOSIS — F332 Major depressive disorder, recurrent severe without psychotic features: Secondary | ICD-10-CM | POA: Diagnosis not present

## 2022-08-20 DIAGNOSIS — H524 Presbyopia: Secondary | ICD-10-CM | POA: Diagnosis not present

## 2022-08-21 DIAGNOSIS — F332 Major depressive disorder, recurrent severe without psychotic features: Secondary | ICD-10-CM | POA: Diagnosis not present

## 2022-08-21 DIAGNOSIS — F410 Panic disorder [episodic paroxysmal anxiety] without agoraphobia: Secondary | ICD-10-CM | POA: Diagnosis not present

## 2022-08-21 DIAGNOSIS — R69 Illness, unspecified: Secondary | ICD-10-CM | POA: Diagnosis not present

## 2022-08-26 ENCOUNTER — Encounter: Payer: Self-pay | Admitting: Family Medicine

## 2022-08-26 ENCOUNTER — Ambulatory Visit (INDEPENDENT_AMBULATORY_CARE_PROVIDER_SITE_OTHER): Payer: Medicare HMO | Admitting: Family Medicine

## 2022-08-26 VITALS — BP 110/62 | HR 55 | Temp 98.0°F | Ht 70.0 in | Wt 161.0 lb

## 2022-08-26 DIAGNOSIS — E7800 Pure hypercholesterolemia, unspecified: Secondary | ICD-10-CM

## 2022-08-26 DIAGNOSIS — I10 Essential (primary) hypertension: Secondary | ICD-10-CM

## 2022-08-26 DIAGNOSIS — R69 Illness, unspecified: Secondary | ICD-10-CM | POA: Diagnosis not present

## 2022-08-26 DIAGNOSIS — F339 Major depressive disorder, recurrent, unspecified: Secondary | ICD-10-CM

## 2022-08-26 DIAGNOSIS — N400 Enlarged prostate without lower urinary tract symptoms: Secondary | ICD-10-CM

## 2022-08-26 DIAGNOSIS — I7121 Aneurysm of the ascending aorta, without rupture: Secondary | ICD-10-CM | POA: Diagnosis not present

## 2022-08-26 NOTE — Patient Instructions (Addendum)
It was very nice to see you today!  Get Tdap at pharmacy  PLEASE NOTE:  If you had any lab tests please let us know if you have not heard back within a few days. You may see your results on MyChart before we have a chance to review them but we will give you a call once they are reviewed by [place]. If we ordered any referrals today, please let us know if you have not heard from their office within the next week.   Please try these tips to maintain a healthy lifestyle:  Eat most of your calories during the day when you are active. Eliminate processed foods including packaged sweets (pies, cakes, cookies), reduce intake of potatoes, white bread, white pasta, and white rice. Look for whole grain options, oat flour or almond flour.  Each meal should contain half fruits/vegetables, one quarter protein, and one quarter carbs (no bigger than a computer mouse).  Cut down on sweet beverages. This includes juice, soda, and sweet tea. Also watch fruit intake, though this is a healthier sweet option, it still contains natural sugar! Limit to 3 servings daily.  Drink at least 1 glass of water with each meal and aim for at least 8 glasses per day  Exercise at least 150 minutes every week.

## 2022-08-26 NOTE — Progress Notes (Signed)
Subjective:     Patient ID: Eugene Guzman, male    DOB: Apr 21, 1956, 67 y.o.   MRN: [id number]  Chief Complaint  Patient presents with   Transfer of Care     Transfer of Care     HPI-here w/wife Toc  HTN-Pt is on amlodipine 2.8m.  Bp's running  130-140/70-80.  No ha/dizziness/cp/palp/edema/cough/sob  HLD-on crestor 20 and zetia 10 Asc thoracic aneury-h/o bicuspid valve, coronary calcifications.  Seeing vasc.  Stable 4.5cm.  on asa 854mAnx/depression-sees psych-[facility].  Had TMS-no help.  Health issues, mom's health, etc.  Whole life "doom and gloom".  Trying to get meds optimized.  Just started wellbutrin in Jan.  Was doing well till March.  [place]shock tx's worked for some of them. BPH-seeing urol. Dr. MaLendell CapriceSA.   Health Maintenance Due  Topic Date Due   DTaP/Tdap/Td (1 - Tdap) Never done   Medicare Annual Wellness (AWV)  05/06/2022    Past Medical History:  Diagnosis Date   Anxiety    Aortic aneurysm (HCC)    Coronary artery calcification seen on CT scan    Depressed    Hypertension     Past Surgical History:  Procedure Laterality Date   INTRAVASCULAR PRESSURE WIRE/FFR STUDY N/A 04/28/2022   Procedure: INTRAVASCULAR PRESSURE WIRE/FFR STUDY;  Surgeon: HaLeonie ManMD;  [facility];  Service: Cardiovascular;  Laterality: N/A;   LEFT HEART CATH AND CORONARY ANGIOGRAPHY N/A 04/28/2022   Procedure: LEFT HEART CATH AND CORONARY ANGIOGRAPHY;  Surgeon: HaLeonie ManMD;  [facility];  Service: Cardiovascular;  Laterality: N/A;   TONSILLECTOMY  1961    Outpatient Medications Prior to Visit  Medication Sig Dispense Refill   amLODipine (NORVASC) 2.5 MG tablet Take 1 tablet (2.5 mg total) by mouth daily. 90 tablet 3   aspirin EC 81 MG tablet Take 1 tablet (81 mg total) by mouth daily. Swallow whole. 90 tablet 3   clonazePAM (KLONOPIN) 0.5 MG tablet      ezetimibe (ZETIA) 10 MG tablet Take 1 tablet (10 mg  total) by mouth daily. 90 tablet 3   finasteride (PROSCAR) 5 MG tablet Take 5 mg by mouth daily.     fluvoxaMINE (LUVOX) 100 MG tablet Take 100 mg by mouth at bedtime.     lamoTRIgine (LAMICTAL) 200 MG tablet Take 200 mg by mouth daily.     multivitamin (ONE-A-DAY MEN'S) TABS tablet Take 1 tablet by mouth daily.     rosuvastatin (CRESTOR) 20 MG tablet Take 1 tablet (20 mg total) by mouth daily. 90 tablet 3   WELLBUTRIN XL 150 MG 24 hr tablet      clonazePAM (KLONOPIN) 2 MG tablet Take 2 mg by mouth 2 (two) times daily as needed for anxiety.     Transcran Magnetic Stimulator DEVI      No facility-administered medications prior to visit.    Allergies  Allergen Reactions   Penicillins Other (See Comments)    Childhood   Latex Rash   ROS neg/noncontributory except as noted HPI/below Fatigue. Occ dysphagia-intermitt. Occ reflux.       Objective:     BP 110/62 (BP Location: Left Arm, Patient Position: Sitting, Cuff Size: Normal)   Pulse (!) 55   Temp 98 F (36.7 C) (Temporal)   Ht 5' 10"$  (1.778 m)   Wt 161 lb (73 kg)   SpO2 98%   BMI 23.10 kg/m  Wt Readings from Last 3  Encounters:  08/26/22 161 lb (73 kg)  06/25/22 159 lb (72.1 kg)  06/09/22 159 lb 3.2 oz (72.2 kg)    Physical Exam   Gen: WDWN NAD  122/76 pt cuff  ours 110/62-2 different cuffs HEENT: NCAT, conjunctiva not injected, sclera nonicteric NECK:  supple, no thyromegaly, no nodes, no carotid bruits CARDIAC: RRR, S1S2+, no murmur. DP 2+B LUNGS: CTAB. No wheezes ABDOMEN:  BS+, soft, NTND, No HSM, no masses EXT:  no edema MSK: no gross abnormalities.  NEURO: A&O x3.  CN II-XII intact. Some tremor PSYCH: normal mood. Good eye contact     Assessment & Plan:   Problem List Items Addressed This Visit       Cardiovascular and Mediastinum   Hypertension - Primary   Ascending aortic aneurysm (HCC)     Genitourinary   BPH (benign prostatic hyperplasia)     Other   Depression, recurrent (HCC)   Relevant  Medications   WELLBUTRIN XL 150 MG 24 hr tablet   Hyperlipidemia   HTN-chronic.  Well controlled.  Cont amlodipine 2.30m daily. Hyperlipidemia-chronic.  Controlled. Cont crestor 20 and zetia 10.  Managed by Card Ascending aortic aneurysm-chronic.  Stable.  Followed q 677moVTS.  Pt on ASA, statin, amlodipine BPH-chronic.  Controlled on fiasteride 70m74m Managed by urol Depression-chronic.  Not ideally controlled.  Managed by psych.  F/u 51mo32moual and labs.   No orders of the defined types were placed in this encounter.   Murline Weigel [place]

## 2022-09-03 DIAGNOSIS — G4733 Obstructive sleep apnea (adult) (pediatric): Secondary | ICD-10-CM | POA: Diagnosis not present

## 2022-09-16 ENCOUNTER — Telehealth: Payer: Self-pay | Admitting: Family Medicine

## 2022-09-16 NOTE — Telephone Encounter (Signed)
Copied from [place] [id number]. Topic: Medicare AWV >> Sep 16, 2022  1:12 PM Gillis Santa wrote: Reason for CRM: Called patient to schedule Medicare Annual Wellness Visit (AWV). Left message for patient to call back and schedule Medicare Annual Wellness Visit (AWV).  Last date of AWV: 05/06/2021  Please schedule an appointment at any time with Otila Kluver, [facility].  Please schedule AWVS with Otila Kluver, Health Coach with [facility].  If any questions, please contact me at [phone].  Thank you ,  Shaune Pollack [facility] AWV TEAM Direct Dial [phone]

## 2022-09-17 ENCOUNTER — Telehealth: Payer: Self-pay | Admitting: Family Medicine

## 2022-09-17 NOTE — Telephone Encounter (Signed)
Contacted Eugene Guzman to schedule their annual wellness visit. Appointment made for 09/28/2022.  [place] Direct Dial [phone]

## 2022-09-28 ENCOUNTER — Ambulatory Visit (INDEPENDENT_AMBULATORY_CARE_PROVIDER_SITE_OTHER): Payer: Medicare HMO

## 2022-09-28 VITALS — Wt 161.0 lb

## 2022-09-28 DIAGNOSIS — Z0000 Encounter for general adult medical examination without abnormal findings: Secondary | ICD-10-CM

## 2022-09-28 NOTE — Patient Instructions (Signed)
Eugene Guzman , Thank you for taking time to come for your Medicare Wellness Visit. I appreciate your ongoing commitment to your health goals. Please review the following plan we discussed and let me know if I can assist you in the future.   These are the goals we discussed:  Goals      Patient Stated     None at this time         This is a list of the screening recommended for you and due dates:  Health Maintenance  Topic Date Due   DTaP/Tdap/Td vaccine (1 - Tdap) Never done   COVID-19 Vaccine (6 - 2023-24 season) 10/21/2022*   Colon Cancer Screening  03/01/2023   Medicare Annual Wellness Visit  09/28/2023   Pneumonia Vaccine  Completed   Flu Shot  Completed   Hepatitis C Screening: USPSTF Recommendation to screen - Ages 18-79 yo.  Completed   Zoster (Shingles) Vaccine  Completed   HPV Vaccine  Aged Out  *Topic was postponed. The date shown is not the original due date.    Advanced directives: Please bring a copy of your health care power of attorney and living will to the office at your convenience.  Conditions/risks identified: none at this time    Next appointment: Follow up in one year for your annual wellness visit.   Preventive Care 69 Years and Older, Male  Preventive care refers to lifestyle choices and visits with your health care provider that can promote health and wellness. What does preventive care include? A yearly physical exam. This is also called an annual well check. Dental exams once or twice a year. Routine eye exams. Ask your health care provider how often you should have your eyes checked. Personal lifestyle choices, including: Daily care of your teeth and gums. Regular physical activity. Eating a healthy diet. Avoiding tobacco and drug use. Limiting alcohol use. Practicing safe sex. Taking low doses of aspirin every day. Taking vitamin and mineral supplements as recommended by your health care provider. What happens during an annual well  check? The services and screenings done by your health care provider during your annual well check will depend on your age, overall health, lifestyle risk factors, and family history of disease. Counseling  Your health care provider may ask you questions about your: Alcohol use. Tobacco use. Drug use. Emotional well-being. Home and relationship well-being. Sexual activity. Eating habits. History of falls. Memory and ability to understand (cognition). Work and work Statistician. Screening  You may have the following tests or measurements: Height, weight, and BMI. Blood pressure. Lipid and cholesterol levels. These may be checked every 5 years, or more frequently if you are over 74 years old. Skin check. Lung cancer screening. You may have this screening every year starting at age 69 if you have a 30-pack-year history of smoking and currently smoke or have quit within the past 15 years. Fecal occult blood test (FOBT) of the stool. You may have this test every year starting at age 69. Flexible sigmoidoscopy or colonoscopy. You may have a sigmoidoscopy every 5 years or a colonoscopy every 10 years starting at age 69. Prostate cancer screening. Recommendations will vary depending on your family history and other risks. Hepatitis C blood test. Hepatitis B blood test. Sexually transmitted disease (STD) testing. Diabetes screening. This is done by checking your blood sugar (glucose) after you have not eaten for a while (fasting). You may have this done every 1-3 years. Abdominal aortic aneurysm (AAA) screening. You  may need this if you are a current or former smoker. Osteoporosis. You may be screened starting at age 57 if you are at high risk. Talk with your health care provider about your test results, treatment options, and if necessary, the need for more tests. Vaccines  Your health care provider may recommend certain vaccines, such as: Influenza vaccine. This is recommended every  year. Tetanus, diphtheria, and acellular pertussis (Tdap, Td) vaccine. You may need a Td booster every 10 years. Zoster vaccine. You may need this after age 30. Pneumococcal 13-valent conjugate (PCV13) vaccine. One dose is recommended after age 69. Pneumococcal polysaccharide (PPSV23) vaccine. One dose is recommended after age 69. Talk to your health care provider about which screenings and vaccines you need and how often you need them. This information is not intended to replace advice given to you by your health care provider. Make sure you discuss any questions you have with your health care provider. Document Released: 07/19/2015 Document Revised: 03/11/2016 Document Reviewed: 04/23/2015 Elsevier Interactive Patient Education  2017 [place] Prevention in the Home Falls can cause injuries. They can happen to people of all ages. There are many things you can do to make your home safe and to help prevent falls. What can I do on the outside of my home? Regularly fix the edges of walkways and driveways and fix any cracks. Remove anything that might make you trip as you walk through a door, such as a raised step or threshold. Trim any bushes or trees on the path to your home. Use bright outdoor lighting. Clear any walking paths of anything that might make someone trip, such as rocks or tools. Regularly check to see if handrails are loose or broken. Make sure that both sides of any steps have handrails. Any raised decks and porches should have guardrails on the edges. Have any leaves, snow, or ice cleared regularly. Use sand or salt on walking paths during winter. Clean up any spills in your garage right away. This includes oil or grease spills. What can I do in the bathroom? Use night lights. Install grab bars by the toilet and in the tub and shower. Do not use towel bars as grab bars. Use non-skid mats or decals in the tub or shower. If you need to sit down in the shower, use a  plastic, non-slip stool. Keep the floor dry. Clean up any water that spills on the floor as soon as it happens. Remove soap buildup in the tub or shower regularly. Attach bath mats securely with double-sided non-slip rug tape. Do not have throw rugs and other things on the floor that can make you trip. What can I do in the bedroom? Use night lights. Make sure that you have a light by your bed that is easy to reach. Do not use any sheets or blankets that are too big for your bed. They should not hang down onto the floor. Have a firm chair that has side arms. You can use this for support while you get dressed. Do not have throw rugs and other things on the floor that can make you trip. What can I do in the kitchen? Clean up any spills right away. Avoid walking on wet floors. Keep items that you use a lot in easy-to-reach places. If you need to reach something above you, use a strong step stool that has a grab bar. Keep electrical cords out of the way. Do not use floor polish or wax that  makes floors slippery. If you must use wax, use non-skid floor wax. Do not have throw rugs and other things on the floor that can make you trip. What can I do with my stairs? Do not leave any items on the stairs. Make sure that there are handrails on both sides of the stairs and use them. Fix handrails that are broken or loose. Make sure that handrails are as long as the stairways. Check any carpeting to make sure that it is firmly attached to the stairs. Fix any carpet that is loose or worn. Avoid having throw rugs at the top or bottom of the stairs. If you do have throw rugs, attach them to the floor with carpet tape. Make sure that you have a light switch at the top of the stairs and the bottom of the stairs. If you do not have them, ask someone to add them for you. What else can I do to help prevent falls? Wear shoes that: Do not have high heels. Have rubber bottoms. Are comfortable and fit you  well. Are closed at the toe. Do not wear sandals. If you use a stepladder: Make sure that it is fully opened. Do not climb a closed stepladder. Make sure that both sides of the stepladder are locked into place. Ask someone to hold it for you, if possible. Clearly mark and make sure that you can see: Any grab bars or handrails. First and last steps. Where the edge of each step is. Use tools that help you move around (mobility aids) if they are needed. These include: Canes. Walkers. Scooters. Crutches. Turn on the lights when you go into a dark area. Replace any light bulbs as soon as they burn out. Set up your furniture so you have a clear path. Avoid moving your furniture around. If any of your floors are uneven, fix them. If there are any pets around you, be aware of where they are. Review your medicines with your doctor. Some medicines can make you feel dizzy. This can increase your chance of falling. Ask your doctor what other things that you can do to help prevent falls. This information is not intended to replace advice given to you by your health care provider. Make sure you discuss any questions you have with your health care provider. Document Released: 04/18/2009 Document Revised: 11/28/2015 Document Reviewed: 07/27/2014 Elsevier Interactive Patient Education  2017 [employer].

## 2022-09-28 NOTE — Progress Notes (Addendum)
I connected with  Eugene Guzman on 09/28/22 by a audio enabled telemedicine application and verified that I am speaking with the correct person using two identifiers.  Patient Location: Home  Provider Location: Office/Clinic  I discussed the limitations of evaluation and management by telemedicine. The patient expressed understanding and agreed to proceed.     Patient Medicare AWV questionnaire was completed by the patient on 09/24/22; I have confirmed that all information answered by patient is correct and no changes since this date.      Subjective:   Eugene Guzman is a 67 y.o. male who presents for Medicare Annual/Subsequent preventive examination.  Review of Systems     Cardiac Risk Factors include: advanced age (>33men, >103 women);hypertension;dyslipidemia;male gender     Objective:    Today's Vitals   09/28/22 1527  Weight: 161 lb (73 kg)   Body mass index is 23.1 kg/m.     09/28/2022    3:34 PM 04/28/2022    8:52 AM  Advanced Directives  Does Patient Have a Medical Advance Directive? Yes Yes  Type of Paramedic of [place];Living will [place];Living will  Does patient want to make changes to medical advance directive?  No - Patient declined  Copy of [place] in Chart? No - copy requested No - copy requested    Current Medications (verified) Outpatient Encounter Medications as of 09/28/2022  Medication Sig   ABRYSVO 120 MCG/0.5ML injection    amLODipine (NORVASC) 2.5 MG tablet Take 1 tablet (2.5 mg total) by mouth daily.   aspirin EC 81 MG tablet Take 1 tablet (81 mg total) by mouth daily. Swallow whole.   clonazePAM (KLONOPIN) 0.5 MG tablet    COMIRNATY SUSP injection    ezetimibe (ZETIA) 10 MG tablet Take 1 tablet (10 mg total) by mouth daily.   finasteride (PROSCAR) 5 MG tablet Take 5 mg by mouth daily.   fluvoxaMINE (LUVOX) 100 MG tablet Take 100 mg by mouth at bedtime.   lamoTRIgine  (LAMICTAL) 200 MG tablet Take 200 mg by mouth daily.   multivitamin (ONE-A-DAY MEN'S) TABS tablet Take 1 tablet by mouth daily.   rosuvastatin (CRESTOR) 20 MG tablet Take 1 tablet (20 mg total) by mouth daily.   WELLBUTRIN XL 150 MG 24 hr tablet    No facility-administered encounter medications on file as of 09/28/2022.    Allergies (verified) Penicillins and Latex   History: Past Medical History:  Diagnosis Date   Anxiety    Aortic aneurysm (HCC)    Coronary artery calcification seen on CT scan    Depressed    Hypertension    Past Surgical History:  Procedure Laterality Date   INTRAVASCULAR PRESSURE WIRE/FFR STUDY N/A 04/28/2022   Procedure: INTRAVASCULAR PRESSURE WIRE/FFR STUDY;  Surgeon: Leonie Man, MD;  Location: [facility];  Service: Cardiovascular;  Laterality: N/A;   LEFT HEART CATH AND CORONARY ANGIOGRAPHY N/A 04/28/2022   Procedure: LEFT HEART CATH AND CORONARY ANGIOGRAPHY;  Surgeon: Leonie Man, MD;  Location: [facility];  Service: Cardiovascular;  Laterality: N/A;   TONSILLECTOMY  1961   Family History  Problem Relation Age of Onset   Hypertension Mother    Heart attack Father 19   Ovarian cancer Maternal Grandmother    Colon cancer Paternal Grandfather 40   Social History   Socioeconomic History   Marital status: Married    Spouse name: Eugene Guzman   Number of children: 0  Years of education: Not on file   Highest education level: Bachelor's degree (e.g., BA, AB, BS)  Occupational History   Not on file  Tobacco Use   Smoking status: Former    Types: Cigars    Quit date: 2015    Years since quitting: 9.2   Smokeless tobacco: Never  Vaping Use   Vaping Use: Never used  Substance and Sexual Activity   Alcohol use: Yes    Alcohol/week: 10.0 standard drinks of alcohol    Types: 10 Cans of beer per week   Drug use: No   Sexual activity: Not on file  Other Topics Concern   Not on file  Social History Narrative   Lives with  wife   Right handed   Caffeine: ice tea daily   Social Determinants of Health   Financial Resource Strain: Low Risk  (09/24/2022)   Overall Financial Resource Strain (CARDIA)    Difficulty of Paying Living Expenses: Not hard at all  Food Insecurity: No Food Insecurity (09/24/2022)   Hunger Vital Sign    Worried About Running Out of Food in the Last Year: Never true    Ran Out of Food in the Last Year: Never true  Transportation Needs: No Transportation Needs (09/24/2022)   PRAPARE - Hydrologist (Medical): No    Lack of Transportation (Non-Medical): No  Physical Activity: Sufficiently Active (09/24/2022)   Exercise Vital Sign    Days of Exercise per Week: 3 days    Minutes of Exercise per Session: 60 min  Stress: Stress Concern Present (09/24/2022)   [place]    Feeling of Stress : Very much  Social Connections: Moderately Isolated (09/24/2022)   Social Connection and Isolation Panel [NHANES]    Frequency of Communication with Friends and Family: More than three times a week    Frequency of Social Gatherings with Friends and Family: More than three times a week    Attends Religious Services: Never    Marine scientist or Organizations: No    Attends Music therapist: Patient declined    Marital Status: Married    Tobacco Counseling Counseling given: Not Answered   Clinical Intake:  Pre-visit preparation completed: Yes  Pain : No/denies pain     BMI - recorded: 23.1 Nutritional Status: BMI of 19-24  Normal Nutritional Risks: None Diabetes: No  How often do you need to have someone help you when you read instructions, pamphlets, or other written materials from your doctor or pharmacy?: 1 - Never  Diabetic?no  Interpreter Needed?: No  Information entered by :: Charlott Rakes, LPN   Activities of Daily Living    09/24/2022    1:26 PM  In your present  state of health, do you have any difficulty performing the following activities:  Hearing? 0  Vision? 0  Difficulty concentrating or making decisions? 0  Walking or climbing stairs? 0  Dressing or bathing? 0  Doing errands, shopping? 0  Preparing Food and eating ? N  Using the Toilet? N  In the past six months, have you accidently leaked urine? N  Do you have problems with loss of bowel control? N  Managing your Medications? N  Managing your Finances? N  Housekeeping or managing your Housekeeping? N    Patient Care Team: Tawnya Crook, MD as PCP - General (Family Medicine) Fay Records, MD as PCP - Cardiology (Cardiology)  Indicate any  recent Medical Services you may have received from other than [facility] providers in the past year (date may be approximate).     Assessment:   This is a routine wellness examination for [facility].  Hearing/Vision screen Hearing Screening - Comments:: Pt stated slight loss  Vision Screening - Comments:: Pt follows up with [facility] for annual eye exams   Dietary issues and exercise activities discussed: Current Exercise Habits: Home exercise routine, Type of exercise: Other - see comments, Time (Minutes): > 60, Frequency (Times/Week): 3, Weekly Exercise (Minutes/Week): 0   Goals Addressed             This Visit's Progress    Patient Stated       None at this time        Depression Screen    09/28/2022    3:30 PM 08/26/2022    9:02 AM 02/19/2022    3:49 PM 05/06/2021    2:16 PM 01/31/2021    1:20 PM  PHQ 2/9 Scores  PHQ - 2 Score 3 5 6 6 6   PHQ- 9 Score 12 19 19 20 19     Fall Risk    09/24/2022    1:26 PM 08/26/2022    9:01 AM 02/19/2022    3:50 PM 09/18/2021    9:22 AM 05/06/2021   10:46 AM  [place] in the past year? 0 0 0 0 0  Number falls in past yr: 0 0 0  0  Injury with Fall? 0 0 0  0  Risk for fall due to : Impaired vision No Fall Risks No Fall Risks    Follow up Falls prevention discussed Falls  evaluation completed Falls evaluation completed      [place]:  Any stairs in or around the home? Yes  If so, are there any without handrails? No  Home free of loose throw rugs in walkways, pet beds, electrical cords, etc? Yes  Adequate lighting in your home to reduce risk of falls? Yes   ASSISTIVE DEVICES UTILIZED TO PREVENT FALLS:  Life alert? No  Use of a cane, walker or w/c? No  Grab bars in the bathroom? No  Shower chair or bench in shower? No  Elevated toilet seat or a handicapped toilet? No   TIMED UP AND GO:  Was the test performed? No .   Cognitive Function:        09/28/2022    3:33 PM  6CIT Screen  What Year? 0 points  What month? 0 points  What time? 0 points  Count back from 20 0 points  Months in reverse 0 points  Repeat phrase 0 points  Total Score 0 points    Immunizations Immunization History  Administered Date(s) Administered   Influenza, High Dose Seasonal PF 07/08/2022   Influenza, Seasonal, Injecte, Preservative Fre 04/11/2014   Influenza,inj,Quad PF,6+ Mos 05/14/2015, 04/11/2016   Influenza-Unspecified 04/22/2021   PFIZER(Purple Top)SARS-COV-2 Vaccination 08/07/2019, 08/28/2019, 05/07/2020, 10/08/2020, 03/18/2021   PNEUMOCOCCAL CONJUGATE-20 05/06/2021   Zoster Recombinat (Shingrix) 12/04/2020, 06/10/2021    TDAP status: Due, Education has been provided regarding the importance of this vaccine. Advised may receive this vaccine at local pharmacy or Health Dept. Aware to provide a copy of the vaccination record if obtained from local pharmacy or Health Dept. Verbalized acceptance and understanding.  Flu Vaccine status: Up to date  Pneumococcal vaccine status: Up to date  Covid-19 vaccine status: Completed vaccines  Qualifies for Shingles Vaccine?  Yes   Zostavax completed Yes   Shingrix Completed?: Yes  Screening Tests Health Maintenance  Topic Date Due   DTaP/Tdap/Td (1 - Tdap) Never done   COVID-19  Vaccine (6 - 2023-24 season) 10/21/2022 (Originally 03/06/2022)   COLONOSCOPY (Pts 45-19yrs Insurance coverage will need to be confirmed)  03/01/2023   Medicare Annual Wellness (AWV)  09/28/2023   Pneumonia Vaccine 48+ Years old  Completed   INFLUENZA VACCINE  Completed   Hepatitis C Screening  Completed   Zoster Vaccines- Shingrix  Completed   HPV VACCINES  Aged Out    Health Maintenance  Health Maintenance Due  Topic Date Due   DTaP/Tdap/Td (1 - Tdap) Never done    Colorectal cancer screening: Type of screening: Colonoscopy. Completed 02/28/18. Repeat every 5 years   Additional Screening:  Hepatitis C Screening: Completed 10/30/15  Vision Screening: Recommended annual ophthalmology exams for early detection of glaucoma and other disorders of the eye. Is the patient up to date with their annual eye exam?  Yes  Who is the provider or what is the name of the office in which the patient attends annual eye exams? Dr Sherral Hammers  If pt is not established with a provider, would they like to be referred to a provider to establish care? No .   Dental Screening: Recommended annual dental exams for proper oral hygiene  Community Resource Referral / Chronic Care Management: CRR required this visit?  No   CCM required this visit?  No      Plan:     I have personally reviewed and noted the following in the patient's chart:   Medical and social history Use of alcohol, tobacco or illicit drugs  Current medications and supplements including opioid prescriptions. Patient is not currently taking opioid prescriptions. Functional ability and status Nutritional status Physical activity Advanced directives List of other physicians Hospitalizations, surgeries, and ER visits in previous 12 months Vitals Screenings to include cognitive, depression, and falls Referrals and appointments  In addition, I have reviewed and discussed with patient certain preventive protocols, quality metrics, and  best practice recommendations. A written personalized care plan for preventive services as well as general preventive health recommendations were provided to patient.     Willette Brace, LPN   [id number]   Nurse Notes: none

## 2022-09-30 ENCOUNTER — Ambulatory Visit (INDEPENDENT_AMBULATORY_CARE_PROVIDER_SITE_OTHER): Payer: Medicare HMO | Admitting: Family

## 2022-09-30 ENCOUNTER — Encounter: Payer: Self-pay | Admitting: Family

## 2022-09-30 VITALS — BP 143/75 | HR 57 | Temp 97.7°F | Ht 70.0 in | Wt 162.1 lb

## 2022-09-30 DIAGNOSIS — R590 Localized enlarged lymph nodes: Secondary | ICD-10-CM | POA: Diagnosis not present

## 2022-09-30 NOTE — Progress Notes (Signed)
Patient ID: Eugene Guzman, male    DOB: 1956/01/24, 67 y.o.   MRN: [id number]  Chief Complaint  Patient presents with   Mass    Pt c/o lymph node swelling for 3-4 days on right side of neck.     HPI:   Enlarged Lymph node:   pt reports on the right side of his neck he has had an enlarged node the size of a golf ball for 3-4 days, it has not increased or gone down in size. He denies any other sx r/t sore throat, no allergy or sinus sx, no cough, no increased throat mucus, or having dry mouth. Denies any fever, but report having some hot flashes but thought related to starting new medications for his depression.   Assessment & Plan:  1. Lymphadenopathy of right cervical region - right cervical lymph node, enlarged, approx. 4cm in diameter, firm, non-movable. No s/s of allergies or infection. ordering U/S.  - US SOFT TISSUE HEAD & NECK (NON-THYROID); Future   Subjective:    Outpatient Medications Prior to Visit  Medication Sig Dispense Refill   ABRYSVO 120 MCG/0.5ML injection      amLODipine (NORVASC) 2.5 MG tablet Take 1 tablet (2.5 mg total) by mouth daily. 90 tablet 3   aspirin EC 81 MG tablet Take 1 tablet (81 mg total) by mouth daily. Swallow whole. 90 tablet 3   clonazePAM (KLONOPIN) 0.5 MG tablet      COMIRNATY SUSP injection      ezetimibe (ZETIA) 10 MG tablet Take 1 tablet (10 mg total) by mouth daily. 90 tablet 3   finasteride (PROSCAR) 5 MG tablet Take 5 mg by mouth daily.     fluvoxaMINE (LUVOX) 100 MG tablet Take 100 mg by mouth at bedtime.     lamoTRIgine (LAMICTAL) 200 MG tablet Take 200 mg by mouth daily.     multivitamin (ONE-A-DAY MEN'S) TABS tablet Take 1 tablet by mouth daily.     rosuvastatin (CRESTOR) 20 MG tablet Take 1 tablet (20 mg total) by mouth daily. 90 tablet 3   WELLBUTRIN XL 150 MG 24 hr tablet      No facility-administered medications prior to visit.   Past Medical History:  Diagnosis Date   Anxiety    Aortic aneurysm [facility])    Coronary artery  calcification seen on CT scan    Depressed    Hypertension    Past Surgical History:  Procedure Laterality Date   INTRAVASCULAR PRESSURE WIRE/FFR STUDY N/A 04/28/2022   Procedure: INTRAVASCULAR PRESSURE WIRE/FFR STUDY;  Surgeon: Leonie Man, MD;  Location: [facility];  Service: Cardiovascular;  Laterality: N/A;   LEFT HEART CATH AND CORONARY ANGIOGRAPHY N/A 04/28/2022   Procedure: LEFT HEART CATH AND CORONARY ANGIOGRAPHY;  Surgeon: Leonie Man, MD;  Location: [facility];  Service: Cardiovascular;  Laterality: N/A;   TONSILLECTOMY  1961   Allergies  Allergen Reactions   Penicillins Other (See Comments)    Childhood   Latex Rash      Objective:    Physical Exam Vitals and nursing note reviewed.  Constitutional:      General: He is not in acute distress.    Appearance: Normal appearance.  HENT:     Head: Normocephalic.     Right Ear: Tympanic membrane and ear canal normal.     Left Ear: Tympanic membrane and ear canal normal.     Mouth/Throat:     Mouth: Mucous membranes are moist.  Pharynx: Posterior oropharyngeal erythema (mild) present. No pharyngeal swelling, oropharyngeal exudate or uvula swelling.     Tonsils: No tonsillar exudate or tonsillar abscesses.  Eyes:     General:        Right eye: No foreign body or discharge.        Left eye: No foreign body or discharge.     Conjunctiva/sclera: Conjunctivae normal.  Cardiovascular:     Rate and Rhythm: Normal rate and regular rhythm.  Pulmonary:     Effort: Pulmonary effort is normal.     Breath sounds: Normal breath sounds.  Musculoskeletal:        General: Normal range of motion.     Cervical back: Normal range of motion.  Lymphadenopathy:     Head:     Right side of head: No submental, submandibular, preauricular, posterior auricular or occipital adenopathy.     Left side of head: No submental, submandibular, preauricular, posterior auricular or occipital adenopathy.     Cervical:  Cervical adenopathy present.     Right cervical: Superficial cervical adenopathy (larger than left, approx. 4cm in diameter, mildly tender to palpation) present.     Left cervical: Superficial cervical adenopathy present.     Upper Body:     Right upper body: No supraclavicular adenopathy.     Left upper body: No supraclavicular adenopathy.  Skin:    General: Skin is warm and dry.  Neurological:     Mental Status: He is alert and oriented to person, place, and time.  Psychiatric:        Mood and Affect: Mood normal.   BP (!) 143/75 (BP Location: Left Arm, Patient Position: Sitting, Cuff Size: Normal)   Pulse (!) 57   Temp 97.7 F (36.5 C) (Temporal)   Ht 5\' 10"  (1.778 m)   Wt 162 lb 2 oz (73.5 kg)   SpO2 97%   BMI 23.26 kg/m  Wt Readings from Last 3 Encounters:  09/30/22 162 lb 2 oz (73.5 kg)  09/28/22 161 lb (73 kg)  08/26/22 161 lb (73 kg)       Jeanie Sewer, NP

## 2022-10-02 DIAGNOSIS — G4733 Obstructive sleep apnea (adult) (pediatric): Secondary | ICD-10-CM | POA: Diagnosis not present

## 2022-10-14 DIAGNOSIS — F332 Major depressive disorder, recurrent severe without psychotic features: Secondary | ICD-10-CM | POA: Diagnosis not present

## 2022-10-14 DIAGNOSIS — R69 Illness, unspecified: Secondary | ICD-10-CM | POA: Diagnosis not present

## 2022-10-23 ENCOUNTER — Ambulatory Visit
Admission: RE | Admit: 2022-10-23 | Discharge: 2022-10-23 | Disposition: A | Discharge status: Home / Self Care | Payer: Medicare HMO | Source: Ambulatory Visit | Attending: Family | Admitting: Family

## 2022-10-23 DIAGNOSIS — R590 Localized enlarged lymph nodes: Secondary | ICD-10-CM

## 2022-10-27 ENCOUNTER — Encounter: Payer: Self-pay | Admitting: Family Medicine

## 2022-10-28 ENCOUNTER — Other Ambulatory Visit (INDEPENDENT_AMBULATORY_CARE_PROVIDER_SITE_OTHER): Payer: Medicare HMO

## 2022-10-28 ENCOUNTER — Other Ambulatory Visit: Payer: Self-pay | Admitting: *Deleted

## 2022-10-28 ENCOUNTER — Other Ambulatory Visit: Payer: Medicare HMO

## 2022-10-28 DIAGNOSIS — R590 Localized enlarged lymph nodes: Secondary | ICD-10-CM | POA: Diagnosis not present

## 2022-10-28 DIAGNOSIS — R799 Abnormal finding of blood chemistry, unspecified: Secondary | ICD-10-CM | POA: Diagnosis not present

## 2022-10-28 DIAGNOSIS — D72820 Lymphocytosis (symptomatic): Secondary | ICD-10-CM | POA: Diagnosis not present

## 2022-10-28 LAB — CBC WITH DIFFERENTIAL/PLATELET
Basophils Absolute: 0.1 10*3/uL (ref 0.0–0.1)
Basophils Relative: 0.5 % (ref 0.0–3.0)
Eosinophils Absolute: 0.1 10*3/uL (ref 0.0–0.7)
Eosinophils Relative: 0.9 % (ref 0.0–5.0)
HCT: 42 % (ref 39.0–52.0)
Hemoglobin: 13.9 g/dL (ref 13.0–17.0)
Lymphocytes Relative: 69.9 % — ABNORMAL HIGH (ref 12.0–46.0)
Lymphs Abs: 11.4 10*3/uL — ABNORMAL HIGH (ref 0.7–4.0)
MCHC: 33.1 g/dL (ref 30.0–36.0)
MCV: 93.8 fl (ref 78.0–100.0)
Monocytes Absolute: 0.7 10*3/uL (ref 0.1–1.0)
Monocytes Relative: 4.2 % (ref 3.0–12.0)
Neutro Abs: 4 10*3/uL (ref 1.4–7.7)
Neutrophils Relative %: 24.5 % — ABNORMAL LOW (ref 43.0–77.0)
Platelets: 196 10*3/uL (ref 150.0–400.0)
RBC: 4.48 Mil/uL (ref 4.22–5.81)
RDW: 14.3 % (ref 11.5–15.5)
WBC: 16.3 10*3/uL — ABNORMAL HIGH (ref 4.0–10.5)

## 2022-10-28 LAB — COMPREHENSIVE METABOLIC PANEL
ALT: 23 U/L (ref 0–53)
AST: 24 U/L (ref 0–37)
Albumin: 4.7 g/dL (ref 3.5–5.2)
Alkaline Phosphatase: 63 U/L (ref 39–117)
BUN: 15 mg/dL (ref 6–23)
CO2: 28 mEq/L (ref 19–32)
Calcium: 9.7 mg/dL (ref 8.4–10.5)
Chloride: 102 mEq/L (ref 96–112)
Creatinine, Ser: 0.94 mg/dL (ref 0.40–1.50)
GFR: 83.01 mL/min (ref >60.00)
Glucose, Bld: 96 mg/dL (ref 70–99)
Potassium: 4.5 mEq/L (ref 3.5–5.1)
Sodium: 141 mEq/L (ref 135–145)
Total Bilirubin: 0.5 mg/dL (ref 0.2–1.2)
Total Protein: 6.9 g/dL (ref 6.0–8.3)

## 2022-10-29 ENCOUNTER — Other Ambulatory Visit: Payer: Self-pay

## 2022-10-29 ENCOUNTER — Other Ambulatory Visit: Payer: Self-pay | Admitting: Family Medicine

## 2022-10-29 ENCOUNTER — Encounter: Payer: Self-pay | Admitting: Family Medicine

## 2022-10-29 DIAGNOSIS — R590 Localized enlarged lymph nodes: Secondary | ICD-10-CM

## 2022-10-29 DIAGNOSIS — D729 Disorder of white blood cells, unspecified: Secondary | ICD-10-CM

## 2022-10-29 LAB — PATHOLOGIST SMEAR REVIEW

## 2022-10-30 ENCOUNTER — Other Ambulatory Visit: Payer: Medicare HMO

## 2022-10-30 NOTE — Addendum Note (Signed)
Addended by: Angelena Sole on: 10/30/2022 10:29 AM   Modules accepted: Orders

## 2022-10-30 NOTE — Progress Notes (Unsigned)
[facility] [phone]   [phone]  INITIAL CONSULTATION:  Patient Care Team: Eugene Sow, MD as PCP - General (Family Medicine) Eugene Riffle, MD as PCP - Cardiology (Cardiology)  CHIEF COMPLAINTS/PURPOSE OF CONSULTATION:  "Lymphadenopathy"  HISTORY OF PRESENTING ILLNESS:  Eugene Guzman 67 y.o. male with medical history significant for hypertension, CAD, ascending aortic aneurysm, sleep apnea, BPH, depression.  On review of the previous records patient saw PCP on 09/27/22 for enlarged lymph node in neck and and [place] on 10/23/22 showing right submandibular region demonstrates a abnormally enlarged lymph node measuring 2.1 x 1.6 x 2.1 cm. CT soft tissue neck has been ordered and is scheduled for tomorrow 11/03/22.  On exam today endorses he has had right-sided neck swelling for approximately 1 month.  He is accompanied by his spouse who provides additional history.  Patient reports the swelling has been constant and he will intermittently have pain when palpating that area.  He describes it as being tender to the touch rating the pain 3 out of 10 in severity.  Patient does report that over the last 3 days he has had more pain when swallowing, is new.  He is still able to eat and drink, denies any drooling or difficulty breathing.  Patient also endorses he has been having night sweats for the last 2 to 3 months.  He has been fatigued for the last x 1 year.  Spouse noticed dry cough x 1 week.  Patient denies any nasal congestion or cold symptoms.  Denies history of seasonal allergies.  He denies any unintentional weight loss.  Patient denies any fever or chills.  He has not taken any over-the-counter medications for his symptoms.  Patient admits to history of smoking, quit in 2015, has history of half PPD x 45 years.  He drinks 4 beers nightly.  No illicit drug use.  His last colonoscopy was in 2019 which showed benign polyps, he is  due for another one this year.  He is followed by Dr. Berneice Guzman at [facility] urology for elevated PSA values, reports last PSA was in the 2s in 2023.  He reports family history of paternal uncle passing away from leukemia at the age of 49, paternal cousin with CLL, paternal grandfather with colon cancer, maternal grandmother with ovarian cancer and his mother has had both endometrial and kidney cancer.  MEDICAL HISTORY:  Past Medical History:  Diagnosis Date   Anxiety    Aortic aneurysm (H[facility])    Coronary artery calcification seen on CT scan    Depressed    Hypertension     SURGICAL HISTORY: Past Surgical History:  Procedure Laterality Date   CORONARY PRESSURE/FFR STUDY N/A 04/28/2022   Procedure: INTRAVASCULAR PRESSURE WIRE/FFR STUDY;  Surgeon: [name], MD;  [facility];  Service: Cardiovascular;  Laterality: N/A;   LEFT HEART CATH AND CORONARY ANGIOGRAPHY N/A 04/28/2022   Procedure: LEFT HEART CATH AND CORONARY ANGIOGRAPHY;  Surgeon: [name], MD;  Location: [facility] INVASIVE CV LAB;  Service: Cardiovascular;  Laterality: N/A;   TONSILLECTOMY  1961    SOCIAL HISTORY: Social History   Socioeconomic History   Marital status: Married    Spouse name: Eugene Guzman   Number of children: 0   Years of education: Not on file   Highest education level: Bachelor's degree (e.g., BA, AB, BS)  Occupational History   Not on file  Tobacco Use   Smoking status: Former  Types: Cigars    Quit date: 2015    Years since quitting: 9.3   Smokeless tobacco: Never  Vaping Use   Vaping Use: Never used  Substance and Sexual Activity   Alcohol use: Yes    Alcohol/week: 10.0 standard drinks of alcohol    Types: 10 Cans of beer per week   Drug use: No   Sexual activity: Not on file  Other Topics Concern   Not on file  Social History Narrative   Lives with wife   Right handed   Caffeine: ice tea daily   Social Determinants of Health   Financial Resource Strain: Low Risk   (09/24/2022)   Overall Financial Resource Strain (CARDIA)    Difficulty of Paying Living Expenses: Not hard at all  Food Insecurity: No Food Insecurity (09/24/2022)   Hunger Vital Sign    Worried About Running Out of Food in the Last Year: Never true    Ran Out of Food in the Last Year: Never true  Transportation Needs: No Transportation Needs (09/24/2022)   PRAPARE - Administrator, Civil Service (Medical): No    Lack of Transportation (Non-Medical): No  Physical Activity: Sufficiently Active (09/24/2022)   Exercise Vital Sign    Days of Exercise per Week: 3 days    Minutes of Exercise per Session: 60 min  Stress: Stress Concern Present (09/24/2022)   [employer] of Occupational Health - Occupational Stress Questionnaire    Feeling of Stress : Very much  Social Connections: Moderately Isolated (09/24/2022)   Social Connection and Isolation Panel [NHANES]    Frequency of Communication with Friends and Family: More than three times a week    Frequency of Social Gatherings with Friends and Family: More than three times a week    Attends Religious Services: Never    Database administrator or Organizations: No    Attends Banker Meetings: Patient declined    Marital Status: Married  Catering manager Violence: Not At Risk (09/28/2022)   Humiliation, Afraid, Rape, and Kick questionnaire    Fear of Current or Ex-Partner: No    Emotionally Abused: No    Physically Abused: No    Sexually Abused: No    FAMILY HISTORY: Family History  Problem Relation Age of Onset   Hypertension Mother    Heart attack Father 49   Ovarian cancer Maternal Grandmother    Colon cancer Paternal Grandfather 40    ALLERGIES:  is allergic to penicillins and latex.  MEDICATIONS:  Current Outpatient Medications  Medication Sig Dispense Refill   ABRYSVO 120 MCG/0.5ML injection      amLODipine (NORVASC) 2.5 MG tablet Take 1 tablet (2.5 mg total) by mouth daily. 90 tablet 3   aspirin  EC 81 MG tablet Take 1 tablet (81 mg total) by mouth daily. Swallow whole. 90 tablet 3   clonazePAM (KLONOPIN) 0.5 MG tablet      COMIRNATY SUSP injection      ezetimibe (ZETIA) 10 MG tablet Take 1 tablet (10 mg total) by mouth daily. 90 tablet 3   finasteride (PROSCAR) 5 MG tablet Take 5 mg by mouth daily.     fluvoxaMINE (LUVOX) 100 MG tablet Take 100 mg by mouth at bedtime.     lamoTRIgine (LAMICTAL) 200 MG tablet Take 200 mg by mouth daily.     multivitamin (ONE-A-DAY MEN'S) TABS tablet Take 1 tablet by mouth daily.     rosuvastatin (CRESTOR) 20 MG tablet Take 1 tablet (20  mg total) by mouth daily. 90 tablet 3   WELLBUTRIN XL 150 MG 24 hr tablet      No current facility-administered medications for this visit.    REVIEW OF SYSTEMS:   All other systems are reviewed and are negative for acute change except as noted in the HPI.  PHYSICAL EXAMINATION: ECOG PERFORMANCE STATUS: 1 - Symptomatic but completely ambulatory  Vitals:   11/02/22 0958  BP: (!) 144/82  Pulse: 64  Resp: 18  Temp: (!) 97.5 F (36.4 C)  SpO2: 100%   Filed Weights   11/02/22 0958  Weight: 162 lb 14.4 oz (73.9 kg)    Physical Exam Vitals reviewed.  Constitutional:      Appearance: He is not ill-appearing or toxic-appearing.  HENT:     Head: Normocephalic.     Right Ear: Tympanic membrane and external ear normal.     Left Ear: Tympanic membrane and external ear normal.     Nose: Nose normal.     Mouth/Throat:     Mouth: Mucous membranes are moist.     Comments: Mass on anterior hard palate.  Tender to palpation. Eyes:     General: No scleral icterus.    Conjunctiva/sclera: Conjunctivae normal.  Cardiovascular:     Rate and Rhythm: Normal rate and regular rhythm.     Pulses: Normal pulses.     Heart sounds: Normal heart sounds.  Pulmonary:     Effort: Pulmonary effort is normal. No respiratory distress.     Breath sounds: Normal breath sounds. No stridor. No wheezing or rhonchi.  Abdominal:      General: There is no distension.  Musculoskeletal:     Cervical back: Normal range of motion.  Lymphadenopathy:     Head:     Right side of head: Submandibular adenopathy present.     Left side of head: No submandibular adenopathy.     Cervical:     Right cervical: No superficial cervical adenopathy.    Left cervical: No superficial cervical adenopathy.  Skin:    General: Skin is warm and dry.     Capillary Refill: Capillary refill takes less than 2 seconds.  Neurological:     General: No focal deficit present.     Mental Status: He is alert.  Psychiatric:        Mood and Affect: Mood is anxious.       LABORATORY DATA:  I have reviewed the data as listed    Latest Ref Rng & Units 11/02/2022   12:19 PM 10/28/2022    2:00 PM 04/28/2022    8:33 AM  CBC  WBC 4.0 - 10.5 K/uL 14.5  16.3  13.3   Hemoglobin 13.0 - 17.0 g/dL 40.9  81.1  91.4   Hematocrit 39.0 - 52.0 % 40.4  42.0  42.8   Platelets 150 - 400 K/uL 167  196.0  181        Latest Ref Rng & Units 11/02/2022   12:19 PM 10/28/2022    2:00 PM 04/23/2022    2:09 PM  CMP  Glucose 70 - 99 mg/dL 782  96  956   BUN 8 - 23 mg/dL 14  15  15    Creatinine 0.61 - 1.24 mg/dL 2.13  0.86  5.78   Sodium 135 - 145 mmol/L 138  141  138   Potassium 3.5 - 5.1 mmol/L 3.8  4.5  4.5   Chloride 98 - 111 mmol/L 102  102  99  CO2 22 - 32 mmol/L 25  28  23    Calcium 8.9 - 10.3 mg/dL 16.1  9.7  9.6   Total Protein 6.5 - 8.1 g/dL 7.5  6.9    Total Bilirubin 0.3 - 1.2 mg/dL 1.0  0.5    Alkaline Phos 38 - 126 U/L 73  63    AST 15 - 41 U/L 30  24    ALT 0 - 44 U/L 26  23       RADIOGRAPHIC STUDIES: I have personally reviewed the radiological images as listed and agreed with the findings in the report. US SOFT TISSUE HEAD & NECK (NON-THYROID)  Result Date: 10/27/2022 CLINICAL DATA:  Submandibular lymphadenopathy EXAM: ULTRASOUND OF HEAD/NECK SOFT TISSUES TECHNIQUE: Ultrasound examination of the head and neck soft tissues was performed in  the area of clinical concern. COMPARISON:  None available FINDINGS: Targeted sonographic evaluation of the area of concern in the right submandibular region demonstrates a abnormally enlarged lymph node measuring 2.1 x 1.6 x 2.1 cm. IMPRESSION: Abnormally enlarged right submandibular lymph node may be inflammatory or related to malignant processes such is lymphoma, leukemia, or metastasis. Further evaluation with contrast-enhanced soft tissue neck CT should be performed. Electronically Signed   By: Eugene Guzman M.D.   On: 10/27/2022 12:37    ASSESSMENT & PLAN WILKINS ELPERS is a 67 y.o. male presenting [facility] for consultation regarding cervical lymphadenopathy. We have reviewed etiologies including lymphoma, leukemia, metastasis, infectious or inflammatory process. Patient will proceed with laboratory workup today   #Right cervical lymphadenopathy -Labs ordered today include CBC, CMP, ESR, CRP, LDH, flow cytometry, HIV, hepatitis B core antibody, surface antigen and surface antibody, hep C, thyroid panel and uric acid. -Patient is already scheduled for a CT soft tissue neck tomorrow we recommend he proceed with at this time. As there is suspicion for lymphoma we will also order a PET scan and a core biopsy of his right submandibular lymphadenopathy. -I will call patient to discuss lab results.  He will return to clinic once workup is complete.  Patient expressed understanding of the recommended workup and is agreeable to move forward.   All questions were answered. The patient knows to call the clinic with any problems, questions or concerns.  Shared visit with Dr. Candise Guzman   Orders Placed This Encounter  Procedures   [place] CORE BIOPSY (LYMPH NODES)    Standing Status:   Future    Standing Expiration Date:   11/02/2023    Order Specific Question:   Lab orders requested (DO NOT place separate lab orders, these will be automatically ordered during procedure specimen collection):     Answer:   Surgical Pathology    Order Specific Question:   Reason for Exam (SYMPTOM  OR DIAGNOSIS REQUIRED)    Answer:   right submandibular lymphadenopathy    Order Specific Question:   Preferred location?    Answer:   [facility]   NM PET Image Initial (PI) Skull Base To Thigh    Standing Status:   Future    Standing Expiration Date:   11/02/2023    Order Specific Question:   If indicated for the ordered procedure, I authorize the administration of a radiopharmaceutical per Radiology protocol    Answer:   Yes    Order Specific Question:   Preferred imaging location?    Answer:   [facility]   CBC with Differential (Cancer Center Only)    Standing Status:  Future    Number of Occurrences:   1    Standing Expiration Date:   11/02/2023   CMP (Cancer Center only)    Standing Status:   Future    Number of Occurrences:   1    Standing Expiration Date:   11/02/2023   Lactate dehydrogenase (LDH)    Standing Status:   Future    Number of Occurrences:   1    Standing Expiration Date:   11/02/2023   Sedimentation rate    Standing Status:   Future    Number of Occurrences:   1    Standing Expiration Date:   11/02/2023   C-reactive protein    Standing Status:   Future    Number of Occurrences:   1    Standing Expiration Date:   11/02/2023   Flow Cytometry, Peripheral Blood (Oncology)    Standing Status:   Future    Number of Occurrences:   1    Standing Expiration Date:   11/02/2023   HIV antibody (with reflex)    Standing Status:   Future    Number of Occurrences:   1    Standing Expiration Date:   11/02/2023   Hepatitis B core antibody, total    Standing Status:   Future    Number of Occurrences:   1    Standing Expiration Date:   11/02/2023   Hepatitis B surface antibody    Standing Status:   Future    Number of Occurrences:   1    Standing Expiration Date:   11/02/2023   Hepatitis B surface antigen    Standing Status:   Future    Number of Occurrences:   1    Standing  Expiration Date:   11/02/2023   Hepatitis C antibody    Standing Status:   Future    Number of Occurrences:   1    Standing Expiration Date:   11/02/2023   Uric acid    Standing Status:   Future    Number of Occurrences:   1    Standing Expiration Date:   11/02/2023   Thyroid Panel With TSH    Standing Status:   Future    Number of Occurrences:   1    Standing Expiration Date:   11/02/2023      I have spent a total of 60 minutes minutes of face-to-face and non-face-to-face time, preparing to see the patient, obtaining and/or reviewing separately obtained history, performing a medically appropriate examination, counseling and educating the patient, ordering medications/tests/procedures, referring and communicating with other health care professionals, documenting clinical information in the electronic health record, independently interpreting results and communicating results to the patient, and care coordination.   Namon Cirri PA-C Department of Hematology/Oncology [facility] Phone: [phone]    ADDENDUM  .Patient was Personally and independently interviewed, examined and relevant elements of the history of present illness were reviewed in details and an assessment and plan was created. All elements of the patient's history of present illness , assessment and plan were discussed in details with Namon Cirri PA-C The above documentation reflects our combined findings assessment and plan.  67 yo with lymphocytosis for atleast 6 months and LNadenopathy concerning for lymphoproliferative disorder. Labs, flow, LN bx and PET/CT  .The total time spent in the appointment was 60 minutes* .  All of the patient's questions were answered with apparent satisfaction. The patient knows to call the clinic  with any problems, questions or concerns.   Wyvonnia Lora MD MS AAHIVMS [facility] Hematology/Oncology Physician [facility]  .*Total Encounter Time as defined by the Centers for Medicare and Medicaid Services includes, in addition to the face-to-face time of a patient visit (documented in the note above) non-face-to-face time: obtaining and reviewing outside history, ordering and reviewing medications, tests or procedures, care coordination (communications with other health care professionals or caregivers) and documentation in the medical record.  Wyvonnia Lora MD MS

## 2022-10-30 NOTE — Progress Notes (Signed)
Discuss with patient and wife at 1025am.  There is a family history of lymphoma/leukemia.  Patient having some sweats as well.  Doing urgent referral to heme/onc.

## 2022-11-02 ENCOUNTER — Telehealth: Payer: Self-pay | Admitting: Family Medicine

## 2022-11-02 ENCOUNTER — Inpatient Hospital Stay: Payer: Medicare HMO | Attending: Physician Assistant

## 2022-11-02 ENCOUNTER — Encounter: Payer: Self-pay | Admitting: Family Medicine

## 2022-11-02 ENCOUNTER — Inpatient Hospital Stay (HOSPITAL_BASED_OUTPATIENT_CLINIC_OR_DEPARTMENT_OTHER): Payer: Medicare HMO | Admitting: Physician Assistant

## 2022-11-02 VITALS — BP 144/82 | HR 64 | Temp 97.5°F | Resp 18 | Ht 70.0 in | Wt 162.9 lb

## 2022-11-02 DIAGNOSIS — Z800 Family history of malignant neoplasm of digestive organs: Secondary | ICD-10-CM | POA: Diagnosis not present

## 2022-11-02 DIAGNOSIS — Z79899 Other long term (current) drug therapy: Secondary | ICD-10-CM | POA: Insufficient documentation

## 2022-11-02 DIAGNOSIS — Z87891 Personal history of nicotine dependence: Secondary | ICD-10-CM | POA: Diagnosis not present

## 2022-11-02 DIAGNOSIS — R590 Localized enlarged lymph nodes: Secondary | ICD-10-CM | POA: Diagnosis not present

## 2022-11-02 DIAGNOSIS — Z8041 Family history of malignant neoplasm of ovary: Secondary | ICD-10-CM | POA: Insufficient documentation

## 2022-11-02 LAB — CMP (CANCER CENTER ONLY)
ALT: 26 U/L (ref 0–44)
AST: 30 U/L (ref 15–41)
Albumin: 4.7 g/dL (ref 3.5–5.0)
Alkaline Phosphatase: 73 U/L (ref 38–126)
Anion gap: 11 (ref 5–15)
BUN: 14 mg/dL (ref 8–23)
CO2: 25 mmol/L (ref 22–32)
Calcium: 10 mg/dL (ref 8.9–10.3)
Chloride: 102 mmol/L (ref 98–111)
Creatinine: 0.83 mg/dL (ref 0.61–1.24)
GFR, Estimated: >60 mL/min (ref >60)
Glucose, Bld: 137 mg/dL — ABNORMAL HIGH (ref 70–99)
Potassium: 3.8 mmol/L (ref 3.5–5.1)
Sodium: 138 mmol/L (ref 135–145)
Total Bilirubin: 1 mg/dL (ref 0.3–1.2)
Total Protein: 7.5 g/dL (ref 6.5–8.1)

## 2022-11-02 LAB — URIC ACID: Uric Acid, Serum: 6.1 mg/dL (ref 3.7–8.6)

## 2022-11-02 LAB — CBC WITH DIFFERENTIAL (CANCER CENTER ONLY)
Abs Immature Granulocytes: 0.04 10*3/uL (ref 0.00–0.07)
Basophils Absolute: 0.1 10*3/uL (ref 0.0–0.1)
Basophils Relative: 0 %
Eosinophils Absolute: 0.1 10*3/uL (ref 0.0–0.5)
Eosinophils Relative: 1 %
HCT: 40.4 % (ref 39.0–52.0)
Hemoglobin: 13.6 g/dL (ref 13.0–17.0)
Immature Granulocytes: 0 %
Lymphocytes Relative: 55 %
Lymphs Abs: 7.9 10*3/uL — ABNORMAL HIGH (ref 0.7–4.0)
MCH: 31.4 pg (ref 26.0–34.0)
MCHC: 33.7 g/dL (ref 30.0–36.0)
MCV: 93.3 fL (ref 80.0–100.0)
Monocytes Absolute: 0.8 10*3/uL (ref 0.1–1.0)
Monocytes Relative: 5 %
Neutro Abs: 5.7 10*3/uL (ref 1.7–7.7)
Neutrophils Relative %: 39 %
Platelet Count: 167 10*3/uL (ref 150–400)
RBC: 4.33 MIL/uL (ref 4.22–5.81)
RDW: 13.5 % (ref 11.5–15.5)
Smear Review: NORMAL
WBC Count: 14.5 10*3/uL — ABNORMAL HIGH (ref 4.0–10.5)
nRBC: 0 % (ref 0.0–0.2)

## 2022-11-02 LAB — HEPATITIS B SURFACE ANTIGEN: Hepatitis B Surface Ag: NONREACTIVE

## 2022-11-02 LAB — HEPATITIS B CORE ANTIBODY, TOTAL: Hep B Core Total Ab: NONREACTIVE

## 2022-11-02 LAB — HEPATITIS C ANTIBODY: HCV Ab: NONREACTIVE

## 2022-11-02 LAB — SEDIMENTATION RATE: Sed Rate: 16 mm/hr (ref 0–16)

## 2022-11-02 LAB — LACTATE DEHYDROGENASE: LDH: 179 U/L (ref 98–192)

## 2022-11-02 LAB — HEPATITIS B SURFACE ANTIBODY,QUALITATIVE: Hep B S Ab: NONREACTIVE

## 2022-11-02 LAB — HIV ANTIBODY (ROUTINE TESTING W REFLEX): HIV Screen 4th Generation wRfx: NONREACTIVE

## 2022-11-02 LAB — C-REACTIVE PROTEIN: CRP: 4 mg/dL — ABNORMAL HIGH (ref <1.0)

## 2022-11-02 NOTE — Progress Notes (Signed)
Oley Balm, MD  Leodis Rains D PROCEDURE / BIOPSY REVIEW Date: 11/02/22  Requested Biopsy site: R submand LN Reason for request: LAN Imaging review: Best seen on [place] Im 6  Decision: Approved Imaging modality to perform: Ultrasound Schedule with: Patient preference (Local vs Mod Sed) Schedule for: Any VIR  Additional comments: @VIR : Core if possible, lymphoma v met v reactive  Please contact me with questions, concerns, or if issue pertaining to this request arise.  Dayne Oley Balm, MD [facility]

## 2022-11-02 NOTE — Telephone Encounter (Signed)
Pt would like a call back concerning his CT scan. He is going to an appt with the cancer center and wanted to know if he still needs to get the CT scan done. Please advise.

## 2022-11-02 NOTE — Telephone Encounter (Signed)
Please see message below and advise.

## 2022-11-03 ENCOUNTER — Encounter: Payer: Self-pay | Admitting: Physician Assistant

## 2022-11-03 ENCOUNTER — Telehealth: Payer: Self-pay | Admitting: Physician Assistant

## 2022-11-03 ENCOUNTER — Ambulatory Visit
Admission: RE | Admit: 2022-11-03 | Discharge: 2022-11-03 | Disposition: A | Discharge status: Home / Self Care | Payer: Medicare HMO | Source: Ambulatory Visit | Attending: Family Medicine | Admitting: Family Medicine

## 2022-11-03 DIAGNOSIS — R590 Localized enlarged lymph nodes: Secondary | ICD-10-CM

## 2022-11-03 DIAGNOSIS — R221 Localized swelling, mass and lump, neck: Secondary | ICD-10-CM | POA: Diagnosis not present

## 2022-11-03 LAB — SURGICAL PATHOLOGY

## 2022-11-03 MED ORDER — IOPAMIDOL (ISOVUE-300) INJECTION 61%
75.0000 mL | Freq: Once | INTRAVENOUS | Status: AC | PRN
  Administered 2022-11-03: 75 mL via INTRAVENOUS

## 2022-11-03 NOTE — Telephone Encounter (Signed)
Attempted to call patient's home and cell phone to discuss lab results from yesterday's diagnostic clinic appointment. There was no answer. Patient gave permission for a MyChart message to be sent if he was unable to be reached by phone. 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 He still needs biopsy and PET to confirm diagnosis. Both have already been scheduled for next week.

## 2022-11-04 ENCOUNTER — Encounter: Payer: Self-pay | Admitting: Physician Assistant

## 2022-11-04 LAB — THYROID PANEL WITH TSH
Free Thyroxine Index: 2.2 (ref 1.2–4.9)
T3 Uptake Ratio: 32 % (ref 24–39)
T4, Total: 7 ug/dL (ref 4.5–12.0)
TSH: 3.31 u[IU]/mL (ref 0.450–4.500)

## 2022-11-05 ENCOUNTER — Ambulatory Visit: Payer: Medicare HMO | Admitting: Family Medicine

## 2022-11-05 LAB — FLOW CYTOMETRY

## 2022-11-06 ENCOUNTER — Ambulatory Visit
Admission: RE | Admit: 2022-11-06 | Discharge: 2022-11-06 | Disposition: A | Discharge status: Home / Self Care | Payer: Medicare HMO | Source: Ambulatory Visit | Attending: Physician Assistant | Admitting: Physician Assistant

## 2022-11-06 DIAGNOSIS — R591 Generalized enlarged lymph nodes: Secondary | ICD-10-CM | POA: Insufficient documentation

## 2022-11-06 DIAGNOSIS — M25511 Pain in right shoulder: Secondary | ICD-10-CM | POA: Insufficient documentation

## 2022-11-06 DIAGNOSIS — R590 Localized enlarged lymph nodes: Secondary | ICD-10-CM | POA: Diagnosis not present

## 2022-11-06 LAB — GLUCOSE, CAPILLARY: Glucose-Capillary: 96 mg/dL (ref 70–99)

## 2022-11-06 MED ORDER — FLUDEOXYGLUCOSE F - 18 (FDG) INJECTION
8.3000 | Freq: Once | INTRAVENOUS | Status: AC
  Administered 2022-11-06: 8.76 via INTRAVENOUS

## 2022-11-09 ENCOUNTER — Encounter: Payer: Self-pay | Admitting: Family Medicine

## 2022-11-09 ENCOUNTER — Encounter: Payer: Self-pay | Admitting: Physician Assistant

## 2022-11-09 ENCOUNTER — Ambulatory Visit (INDEPENDENT_AMBULATORY_CARE_PROVIDER_SITE_OTHER): Payer: Medicare HMO | Admitting: Family Medicine

## 2022-11-09 VITALS — BP 120/60 | HR 60 | Temp 97.5°F | Resp 16 | Ht 70.0 in | Wt 161.2 lb

## 2022-11-09 DIAGNOSIS — R61 Generalized hyperhidrosis: Secondary | ICD-10-CM | POA: Diagnosis not present

## 2022-11-09 DIAGNOSIS — K1379 Other lesions of oral mucosa: Secondary | ICD-10-CM | POA: Diagnosis not present

## 2022-11-09 DIAGNOSIS — R42 Dizziness and giddiness: Secondary | ICD-10-CM | POA: Diagnosis not present

## 2022-11-09 LAB — POCT GLYCOSYLATED HEMOGLOBIN (HGB A1C): Hemoglobin A1C: 5.3 % (ref 4.0–5.6)

## 2022-11-09 NOTE — Patient Instructions (Signed)
Night time snack-protein  Call oncology

## 2022-11-09 NOTE — Progress Notes (Signed)
Subjective:     Patient ID: [name]., male    DOB: 24-Oct-1955, 67 y.o.   MRN: [id number]  Chief Complaint  Patient presents with   Dizziness    Dizzy spell and slurred speech, had been drinking and took medication     HPI-here w/wife  Dizzy spells w/slurred speech-some incoherant-was using liquor and clonazepam.  Has stopped and resolved.   2 months ago-episode of out of it and drinking more water and better.  This all occurred w/worry from diagnosis of new lymphadenopathy.  One episode, felt better after ate.  Not sleeping at nightly-a lot of hot flashes just at night. Urinating 8-10 x/night. Cousin has CLL as well.  Patient was hot, but last pm sweat. Good stream and good amount. Drinking more water during day. No fevers.   Health Maintenance Due  Topic Date Due   DTaP/Tdap/Td (1 - Tdap) Never done    Past Medical History:  Diagnosis Date   Anxiety    Aortic aneurysm (HCC)    Coronary artery calcification seen on CT scan    Depressed    Hypertension     Past Surgical History:  Procedure Laterality Date   CORONARY PRESSURE/FFR STUDY N/A 04/28/2022   Procedure: INTRAVASCULAR PRESSURE WIRE/FFR STUDY;  Surgeon: [name], MD;  Location: [facility] INVASIVE CV LAB;  Service: Cardiovascular;  Laterality: N/A;   LEFT HEART CATH AND CORONARY ANGIOGRAPHY N/A 04/28/2022   Procedure: LEFT HEART CATH AND CORONARY ANGIOGRAPHY;  Surgeon: [name], MD;  Location: [facility] INVASIVE CV LAB;  Service: Cardiovascular;  Laterality: N/A;   TONSILLECTOMY  1961     Current Outpatient Medications:    amLODipine (NORVASC) 2.5 MG tablet, Take 1 tablet (2.5 mg total) by mouth daily., Disp: 90 tablet, Rfl: 3   aspirin EC 81 MG tablet, Take 1 tablet (81 mg total) by mouth daily. Swallow whole., Disp: 90 tablet, Rfl: 3   clonazePAM (KLONOPIN) 0.5 MG tablet, , Disp: , Rfl:    ezetimibe (ZETIA) 10 MG tablet, Take 1 tablet (10 mg total) by mouth daily., Disp: 90 tablet, Rfl: 3    finasteride (PROSCAR) 5 MG tablet, Take 5 mg by mouth daily., Disp: , Rfl:    fluvoxaMINE (LUVOX) 100 MG tablet, Take 100 mg by mouth at bedtime., Disp: , Rfl:    lamoTRIgine (LAMICTAL) 200 MG tablet, Take 200 mg by mouth daily., Disp: , Rfl:    multivitamin (ONE-A-DAY MEN'S) TABS tablet, Take 1 tablet by mouth daily., Disp: , Rfl:    rosuvastatin (CRESTOR) 20 MG tablet, Take 1 tablet (20 mg total) by mouth daily., Disp: 90 tablet, Rfl: 3   WELLBUTRIN XL 150 MG 24 hr tablet, , Disp: , Rfl:   Allergies  Allergen Reactions   Penicillins Other (See Comments)    Childhood   Latex Rash   ROS neg/noncontributory except as noted HPI/below      Objective:     BP 120/60   Pulse 60   Temp (!) 97.5 F (36.4 C) (Temporal)   Resp 16   Ht 5\' 10"  (1.778 m)   Wt 161 lb 4 oz (73.1 kg)   SpO2 98%   BMI 23.14 kg/m  Wt Readings from Last 3 Encounters:  11/09/22 161 lb 4 oz (73.1 kg)  11/06/22 160 lb 6.4 oz (72.8 kg)  11/02/22 162 lb 14.4 oz (73.9 kg)    Physical Exam   Gen: WDWN NAD HEENT: NCAT, conjunctiva not injected, sclera nonicteric NECK:  supple, no thyromegaly,+ right mobile NT node cervical.  no carotid bruits Outpatient nondiscript mobile nodule right cheek/gum CARDIAC: RRR, S1S2+, no murmur. LUNGS: CTAB. No wheezes ABDOMEN:  BS+, soft, NTND, No HSM, no masses EXT:  no edema MSK: no gross abnormalities.  NEURO: A&O x3.  CN II-XII intact.  PSYCH: normal mood. Good eye contact     Assessment & Plan:  Dizziness -     POCT glycosylated hemoglobin (Hb A1C)  Night sweats  Mass of mouth -     Ambulatory referral to ENT   Dizziness-resolved  probably from benzo and alcohol.  Won't do again.  Sugars ok Night heat/sweats-?medications, ?anxiety,?hypoglycemia, ?cancer.  Try Wellbutrin every other day, nighttime snack.  Follow up onc.   Pet scan report still pendin Mass mouth-?reason for lymph node.  Other.   Refer ENT.  Getting lymph node biopsy in 2 days.   Angelena Sole,  MD

## 2022-11-10 ENCOUNTER — Other Ambulatory Visit: Payer: Self-pay | Admitting: Radiology

## 2022-11-10 ENCOUNTER — Telehealth: Payer: Self-pay | Admitting: Hematology

## 2022-11-10 ENCOUNTER — Telehealth: Payer: Self-pay | Admitting: Physician Assistant

## 2022-11-10 ENCOUNTER — Other Ambulatory Visit: Payer: Self-pay | Admitting: Student

## 2022-11-10 DIAGNOSIS — Z419 Encounter for procedure for purposes other than remedying health state, unspecified: Secondary | ICD-10-CM

## 2022-11-10 NOTE — Telephone Encounter (Signed)
Called patient to discuss results of PET scan. There is no evidence of lymphoma seen. Patient has no significant metabolic activity associated with the enlarged right level 1 lymph node. Patient will follow up with Dr. Candise Che for close observation. Biopsy for tomorrow has been cancelled. Patient agreeable with plan.

## 2022-11-11 ENCOUNTER — Encounter (HOSPITAL_COMMUNITY): Payer: Self-pay

## 2022-11-11 ENCOUNTER — Other Ambulatory Visit: Payer: Self-pay | Admitting: Physician Assistant

## 2022-11-11 ENCOUNTER — Ambulatory Visit (HOSPITAL_COMMUNITY): Payer: Medicare HMO

## 2022-11-11 ENCOUNTER — Encounter: Payer: Self-pay | Admitting: Family Medicine

## 2022-11-13 ENCOUNTER — Encounter: Payer: Self-pay | Admitting: Physician Assistant

## 2022-11-13 DIAGNOSIS — C9110 Chronic lymphocytic leukemia of B-cell type not having achieved remission: Secondary | ICD-10-CM | POA: Diagnosis not present

## 2022-11-13 DIAGNOSIS — R590 Localized enlarged lymph nodes: Secondary | ICD-10-CM | POA: Diagnosis not present

## 2022-11-13 DIAGNOSIS — J351 Hypertrophy of tonsils: Secondary | ICD-10-CM | POA: Diagnosis not present

## 2022-11-13 DIAGNOSIS — K120 Recurrent oral aphthae: Secondary | ICD-10-CM | POA: Diagnosis not present

## 2022-11-18 ENCOUNTER — Ambulatory Visit (HOSPITAL_COMMUNITY): Payer: Medicare HMO

## 2022-11-18 ENCOUNTER — Other Ambulatory Visit: Payer: Self-pay | Admitting: Thoracic Surgery (Cardiothoracic Vascular Surgery)

## 2022-11-18 DIAGNOSIS — I7121 Aneurysm of the ascending aorta, without rupture: Secondary | ICD-10-CM

## 2022-11-19 ENCOUNTER — Encounter: Payer: Self-pay | Admitting: Family Medicine

## 2022-11-20 ENCOUNTER — Other Ambulatory Visit: Payer: Medicare HMO

## 2022-11-20 ENCOUNTER — Other Ambulatory Visit: Payer: Self-pay

## 2022-11-20 DIAGNOSIS — R350 Frequency of micturition: Secondary | ICD-10-CM | POA: Diagnosis not present

## 2022-11-20 NOTE — Addendum Note (Signed)
Addended by: Nash Shearer D on: 11/20/2022 03:01 PM   Modules accepted: Orders

## 2022-11-21 LAB — URINALYSIS, ROUTINE W REFLEX MICROSCOPIC
Bilirubin Urine: NEGATIVE
Glucose, UA: NEGATIVE
Hgb urine dipstick: NEGATIVE
Ketones, ur: NEGATIVE
Leukocytes,Ua: NEGATIVE
Nitrite: NEGATIVE
Protein, ur: NEGATIVE
Specific Gravity, Urine: 1.008 (ref 1.001–1.035)
pH: 6.5 (ref 5.0–8.0)

## 2022-11-22 NOTE — Progress Notes (Signed)
Normal.  Could be prostate acting up.  Who has prescribed the proscar?

## 2022-12-04 ENCOUNTER — Inpatient Hospital Stay: Payer: Medicare HMO | Attending: Physician Assistant | Admitting: Hematology

## 2022-12-04 ENCOUNTER — Inpatient Hospital Stay: Payer: Medicare HMO

## 2022-12-04 VITALS — BP 116/67 | HR 64 | Temp 97.9°F | Resp 17 | Ht 70.0 in | Wt 164.0 lb

## 2022-12-04 DIAGNOSIS — Z800 Family history of malignant neoplasm of digestive organs: Secondary | ICD-10-CM | POA: Insufficient documentation

## 2022-12-04 DIAGNOSIS — Z87891 Personal history of nicotine dependence: Secondary | ICD-10-CM | POA: Diagnosis not present

## 2022-12-04 DIAGNOSIS — C9110 Chronic lymphocytic leukemia of B-cell type not having achieved remission: Secondary | ICD-10-CM | POA: Insufficient documentation

## 2022-12-04 DIAGNOSIS — Z8041 Family history of malignant neoplasm of ovary: Secondary | ICD-10-CM | POA: Insufficient documentation

## 2022-12-04 NOTE — Progress Notes (Signed)
HEMATOLOGY/ONCOLOGY CONSULTATION NOTE  Date of Service: 12/04/2022  Patient Care Team: [name], MD as PCP - General (Family Medicine) Pricilla Riffle, MD as PCP - Cardiology (Cardiology)  CHIEF COMPLAINTS/PURPOSE OF CONSULTATION:  Evaluation and management of newly-diagnosed CLL.   HISTORY OF PRESENTING ILLNESS:  Eugene Guzman. is a wonderful 67 y.o. male who has been referred to [place] by [name], MD for evaluation and management of newly diagnosed CLL.  Patient was seen by [facility] on 11/02/2022 and endorsed constant right-sided neck swelling with intermittent pain with palpation. He also complained of painful swallowing, night sweats, and fatigue.  Today, he is accompanied by his wife. He reports that his lymph nodes have recently decreased in size. Patient does complain of hot flashes in his chest and facial area. He complains of frequent urination and denies drinking high amounts of fluid. He also complains of fatigue. He does take SSRI medication regularly. Patient does sometimes drink light beers. He reports that his cousin does have CLL.   MEDICAL HISTORY:  Past Medical History:  Diagnosis Date   Anxiety    Aortic aneurysm (HCC)    Coronary artery calcification seen on CT scan    Depressed    Hypertension     SURGICAL HISTORY: Past Surgical History:  Procedure Laterality Date   CORONARY PRESSURE/FFR STUDY N/A 04/28/2022   Procedure: INTRAVASCULAR PRESSURE WIRE/FFR STUDY;  Surgeon: [name], MD;  Location: [facility] INVASIVE CV LAB;  Service: Cardiovascular;  Laterality: N/A;   LEFT HEART CATH AND CORONARY ANGIOGRAPHY N/A 04/28/2022   Procedure: LEFT HEART CATH AND CORONARY ANGIOGRAPHY;  Surgeon: [name], MD;  Location: [facility] INVASIVE CV LAB;  Service: Cardiovascular;  Laterality: N/A;   TONSILLECTOMY  1961    SOCIAL HISTORY: Social History   Socioeconomic History   Marital status: Married    Spouse name: Claris Che   Number of  children: 0   Years of education: Not on file   Highest education level: Bachelor's degree (e.g., BA, AB, BS)  Occupational History   Not on file  Tobacco Use   Smoking status: Former    Types: Cigars    Quit date: 2015    Years since quitting: 9.4   Smokeless tobacco: Never  Vaping Use   Vaping Use: Never used  Substance and Sexual Activity   Alcohol use: Yes    Alcohol/week: 10.0 standard drinks of alcohol    Types: 10 Cans of beer per week   Drug use: No   Sexual activity: Not on file  Other Topics Concern   Not on file  Social History Narrative   Lives with wife   Right handed   Caffeine: ice tea daily   Social Determinants of Health   Financial Resource Strain: Low Risk  (11/05/2022)   Overall Financial Resource Strain (CARDIA)    Difficulty of Paying Living Expenses: Not hard at all  Food Insecurity: No Food Insecurity (11/05/2022)   Hunger Vital Sign    Worried About Running Out of Food in the Last Year: Never true    Ran Out of Food in the Last Year: Never true  Transportation Needs: No Transportation Needs (11/05/2022)   PRAPARE - Administrator, Civil Service (Medical): No    Lack of Transportation (Non-Medical): No  Physical Activity: Insufficiently Active (11/05/2022)   Exercise Vital Sign    Days of Exercise per Week: 2 days    Minutes of Exercise per Session:  40 min  Stress: Stress Concern Present (11/05/2022)   [employer] of Occupational Health - Occupational Stress Questionnaire    Feeling of Stress : Very much  Social Connections: Moderately Isolated (11/05/2022)   Social Connection and Isolation Panel [NHANES]    Frequency of Communication with Friends and Family: More than three times a week    Frequency of Social Gatherings with Friends and Family: More than three times a week    Attends Religious Services: Never    Database administrator or Organizations: No    Attends Banker Meetings: Patient declined    Marital Status:  Married  Catering manager Violence: Not At Risk (09/28/2022)   Humiliation, Afraid, Rape, and Kick questionnaire    Fear of Current or Ex-Partner: No    Emotionally Abused: No    Physically Abused: No    Sexually Abused: No    FAMILY HISTORY: Family History  Problem Relation Age of Onset   Hypertension Mother    Heart attack Father 52   Ovarian cancer Maternal Grandmother    Colon cancer Paternal Grandfather 59    ALLERGIES:  is allergic to penicillins and latex.  MEDICATIONS:  Current Outpatient Medications  Medication Sig Dispense Refill   amLODipine (NORVASC) 2.5 MG tablet Take 1 tablet (2.5 mg total) by mouth daily. 90 tablet 3   aspirin EC 81 MG tablet Take 1 tablet (81 mg total) by mouth daily. Swallow whole. 90 tablet 3   clonazePAM (KLONOPIN) 0.5 MG tablet      ezetimibe (ZETIA) 10 MG tablet Take 1 tablet (10 mg total) by mouth daily. 90 tablet 3   finasteride (PROSCAR) 5 MG tablet Take 5 mg by mouth daily.     fluvoxaMINE (LUVOX) 100 MG tablet Take 100 mg by mouth at bedtime.     lamoTRIgine (LAMICTAL) 200 MG tablet Take 200 mg by mouth daily.     multivitamin (ONE-A-DAY MEN'S) TABS tablet Take 1 tablet by mouth daily.     rosuvastatin (CRESTOR) 20 MG tablet Take 1 tablet (20 mg total) by mouth daily. 90 tablet 3   WELLBUTRIN XL 150 MG 24 hr tablet      No current facility-administered medications for this visit.    REVIEW OF SYSTEMS:    10 Point review of Systems was done is negative except as noted above.  PHYSICAL EXAMINATION: ECOG PERFORMANCE STATUS: {CHL ONC ECOG ZO:[id number]}  .There were no vitals filed for this visit. There were no vitals filed for this visit. .There is no height or weight on file to calculate BMI.  GENERAL:alert, in no acute distress and comfortable SKIN: no acute rashes, no significant lesions EYES: conjunctiva are pink and non-injected, sclera anicteric OROPHARYNX: MMM, no exudates, no oropharyngeal erythema or ulceration NECK:  supple, no JVD LYMPH:  no palpable lymphadenopathy in the cervical, axillary or inguinal regions LUNGS: clear to auscultation b/l with normal respiratory effort HEART: regular rate & rhythm ABDOMEN:  normoactive bowel sounds , non tender, not distended. Extremity: no pedal edema PSYCH: alert & oriented x 3 with fluent speech NEURO: no focal motor/sensory deficits  LABORATORY DATA:  I have reviewed the data as listed  .    Latest Ref Rng & Units 11/02/2022   12:19 PM 10/28/2022    2:00 PM 04/28/2022    8:33 AM  CBC  WBC 4.0 - 10.5 K/uL 14.5  16.3  13.3   Hemoglobin 13.0 - 17.0 g/dL 81.1  91.4  78.2   Hematocrit 39.0 -  52.0 % 40.4  42.0  42.8   Platelets 150 - 400 K/uL 167  196.0  181     .    Latest Ref Rng & Units 11/02/2022   12:19 PM 10/28/2022    2:00 PM 04/23/2022    2:09 PM  CMP  Glucose 70 - 99 mg/dL 161  96  096   BUN 8 - 23 mg/dL 14  15  15    Creatinine 0.61 - 1.24 mg/dL 0.45  4.09  8.11   Sodium 135 - 145 mmol/L 138  141  138   Potassium 3.5 - 5.1 mmol/L 3.8  4.5  4.5   Chloride 98 - 111 mmol/L 102  102  99   CO2 22 - 32 mmol/L 25  28  23    Calcium 8.9 - 10.3 mg/dL 91.4  9.7  9.6   Total Protein 6.5 - 8.1 g/dL 7.5  6.9    Total Bilirubin 0.3 - 1.2 mg/dL 1.0  0.5    Alkaline Phos 38 - 126 U/L 73  63    AST 15 - 41 U/L 30  24    ALT 0 - 44 U/L 26  23     11/02/2022 Flow Cytometry:    RADIOGRAPHIC STUDIES: I have personally reviewed the radiological images as listed and agreed with the findings in the report. NM PET Image Initial (PI) Skull Base To Thigh  Result Date: 11/10/2022 CLINICAL DATA:  Initial treatment strategy for submandibular lymphadenopathy. Concern for of lymphoma. EXAM: NUCLEAR MEDICINE PET SKULL BASE TO THIGH TECHNIQUE: 8.8 mCi F-18 FDG was injected intravenously. Full-ring PET imaging was performed from the skull base to thigh after the radiotracer. CT data was obtained and used for attenuation correction and anatomic localization. Fasting blood  glucose: 96 mg/dl COMPARISON:  None Available. FINDINGS: Mediastinal blood pool activity: SUV max 2.1 Liver activity: SUV max NA NECK: RIGHT level 1 B lymph node anterior to the RIGHT submandibular gland measures 12 mm by 20 mm (image 33/series 4) not changed from recent comparison CT exam. The lymph node has no significant metabolic activity (SUV max equal 2.3). This activity is equal to the adjacent benign submandibular gland and less than parotid glands. There is symmetric hypermetabolic activity within the LEFT and RIGHT base of tongue. No asymmetric activity in the oropharynx or hypopharynx. Focus of metabolic activity in the anterior LEFT maxilla is favored odontogenic. (Image 25) Incidental CT findings: None. CHEST: No hypermetabolic mediastinal or hilar nodes. No suspicious pulmonary nodules on the CT scan. Incidental CT findings: None. ABDOMEN/PELVIS: No abnormal hypermetabolic activity within the liver, pancreas, adrenal glands, or spleen. No hypermetabolic lymph nodes in the abdomen or pelvis. Incidental CT findings: None. SKELETON: No focal hypermetabolic activity to suggest skeletal metastasis. Incidental CT findings: None. IMPRESSION: 1. No significant metabolic activity associated with enlarged RIGHT level 1 B lymph node. Favor a benign reactive node. 2. Symmetric metabolic activity at the base of tongue is favored physiologic. 3. No evidence of lymphoma on whole-body FDG PET scan. Electronically Signed   By: Genevive Bi M.D.   On: 11/10/2022 14:47    ASSESSMENT & PLAN:   67 y.o. male with:  PLAN:  -Discussed results of 11/02/2022 CBC which showed WBC of 14.5K, hemoglobin of 13.6, and platelets of 167. -WBC elevated at 14K, lymphocytes elevated at 8K -Hemoglobin and platelets normal -flow cytometry showed that 84% lymphocytes are of one kind. Chemical signature consistent with CLL. -hepatitis testing negative -CMP normal -PET  Lymph node  anterior to the RIGHT submandibular gland  measures 2cm x 1cm in size with SUV level 2.3, which is consistent with CLL. Scan showed no other signs of disease. 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 Patient does have stage 1 which involved elevated blood counts and some lymph nodes -discussed that this disease is often monitored and treatment is not recommended unless it becomes bothersome.  -discussed that studies have shown that treating CLL while it is not bothersome has not shown to have benefits.  -discussed reasons to initiate treatment such as if patient has a large burden of disease, if it is involving important organs, causes pleural effusions, affects the rest of the blood cells, or causes constitutional symptoms -Discussed that most treatment for CLL would tend to be targeted therapies which target CD20 on lymphocytes.  -discussed the 5-10% risk of autoimmune presentation as cells can sometimes make abnormal antibodies and cause low platelets, RBC breakage, or transformation to aggressive lymphoma -will order genetic testing to help predict how CLL may behave  -recommend annual skin screening with dermatologist as CLL increases the risk of non-melanoma skin cancers. Risk increases with frequent sun exposure -advised patient to regularly use sun protection to decrease risk of non-melanoma skin cancers -discussed that the risk of skin cancer is increased if there is a 13q deletion present -discussed that factors such as environment exposures, obesity, smoking, and genetic changes with age, may increase the risk of CLL -it is difficult to attribute hot flashes to CLL as constitutional symptoms typically present with bulky disease and patient's limited involvement of disease. Symptoms may be a side effect of his medication -fatigue and urinary symptoms unlikely to be related to CLL -advised patient to limit  caffeine, stay regularly active, and drink plenty of water to improve hot flashes -answered all of patient's and his wife's questions in detail regarding disease.  -Patient will be seen by urologist on Monday for PSA testing -advised patient to regularly eat healthy and to stay physically active -recommend patient to take vitamin D supplements at least 2000 units daily -advised patient to limit alcohol intake. Discussed that data has shown that even small amounts of alcohol damages brain cells.  FOLLOW-UP: Labs today RTC with Dr Candise Che with labs in 3 months  The total time spent in the appointment was *** minutes* .  All of the patient's questions were answered with apparent satisfaction. The patient knows to call the clinic with any problems, questions or concerns.   Wyvonnia Lora MD MS AAHIVMS [facility] Hematology/Oncology Physician [facility]  .*Total Encounter Time as defined by the Centers for Medicare and Medicaid Services includes, in addition to the face-to-face time of a patient visit (documented in the note above) non-face-to-face time: obtaining and reviewing outside history, ordering and reviewing medications, tests or procedures, care coordination (communications with other health care professionals or caregivers) and documentation in the medical record.    I,Mitra Faeizi,acting as a Neurosurgeon for Wyvonnia Lora, MD.,have documented all relevant documentation on the behalf of Wyvonnia Lora, MD,as directed by  Wyvonnia Lora, MD while in the presence of Wyvonnia Lora, MD.  ***

## 2022-12-07 ENCOUNTER — Telehealth: Payer: Self-pay | Admitting: Hematology

## 2022-12-07 DIAGNOSIS — R972 Elevated prostate specific antigen [PSA]: Secondary | ICD-10-CM | POA: Diagnosis not present

## 2022-12-07 DIAGNOSIS — R3915 Urgency of urination: Secondary | ICD-10-CM | POA: Diagnosis not present

## 2022-12-10 LAB — FISH,CLL PROGNOSTIC PANEL

## 2022-12-11 ENCOUNTER — Other Ambulatory Visit: Payer: Self-pay

## 2022-12-11 DIAGNOSIS — C9110 Chronic lymphocytic leukemia of B-cell type not having achieved remission: Secondary | ICD-10-CM

## 2022-12-17 DIAGNOSIS — R3915 Urgency of urination: Secondary | ICD-10-CM | POA: Diagnosis not present

## 2022-12-22 NOTE — Progress Notes (Signed)
[address]       [place] [address]             [phone]        Kadeen Sroka Kollyn Lingafelter [id number] Aug 28, 1955  History of Present Illness: Mr. Meisinger is a 67 year old male with a past medical history of hypertension, anxiety, depression, coronary calcification, sleep apnea, dyslipidemia, a bicuspid aortic valve, and recently diagnosed CLL. He presents to the office for yearly surveillance of his ascending thoracic aortic aneurysm. His last cardiac catheterization in 2023 showed moderate 3 vessel coronary artery disease for which he is closely followed by cardiology. His last echocardiogram in January of 2023 showed mild aortic regurgitation with a bicuspid aortic valve. His ascending thoracic aortic aneurysm has remained stable at 4.5cm since we began seeing him in 12/2019.   Today he reports/denies chest pain, chest tightness, and dyspnea. He does report dizziness when standing up too quickly but denies LOC.   Current Outpatient Medications on File Prior to Visit  Medication Sig Dispense Refill   amLODipine (NORVASC) 2.5 MG tablet Take 1 tablet (2.5 mg total) by mouth daily. 90 tablet 3   aspirin EC 81 MG tablet Take 1 tablet (81 mg total) by mouth daily. Swallow whole. 90 tablet 3   clonazePAM (KLONOPIN) 0.5 MG tablet  (Patient not taking: Reported on 12/04/2022)     ezetimibe (ZETIA) 10 MG tablet Take 1 tablet (10 mg total) by mouth daily. 90 tablet 3   finasteride (PROSCAR) 5 MG tablet Take 5 mg by mouth daily.     fluvoxaMINE (LUVOX) 100 MG tablet Take 100 mg by mouth at bedtime.     lamoTRIgine (LAMICTAL) 200 MG tablet Take 200 mg by mouth daily.     multivitamin (ONE-A-DAY MEN'S) TABS tablet Take 1 tablet by mouth daily.     rosuvastatin (CRESTOR) 20 MG tablet Take 1 tablet (20 mg total) by mouth daily. 90 tablet 3   WELLBUTRIN XL 150 MG 24 hr tablet 150 mg every other day.     No current facility-administered medications on file prior to visit.    Vitals:  Today's Vitals   12/28/22 1337  BP: 138/73  Pulse: 62  Resp: 18  SpO2: 96%  Weight: 170 lb (77.1 kg)  Height: 5\' 10"  (1.778 m)   Body mass index is 24.39 kg/m.   Physical Exam General: No acute distress Neuro: Grossly intact  CTA Results: CLINICAL DATA:  Aneurysm of ascending aorta.   EXAM: CT ANGIOGRAPHY CHEST WITH CONTRAST   TECHNIQUE: Multidetector CT imaging of the chest was performed using the standard protocol during bolus administration of intravenous contrast. Multiplanar CT image reconstructions and MIPs were obtained to evaluate the vascular anatomy.   RADIATION DOSE REDUCTION: This exam was performed according to the departmental dose-optimization program which includes automated exposure control, adjustment of the mA and/or kV according to patient size and/or use of iterative reconstruction technique.   CONTRAST:  75mL ISOVUE-370 IOPAMIDOL (ISOVUE-370) INJECTION 76%   COMPARISON:  CT angiogram chest 06/22/2022   FINDINGS: Cardiovascular: There is adequate opacification of the thoracic aorta. 4.4 by 4.5 cm ascending thoracic aortic aneurysm appears unchanged. There is no evidence for dissection. There is calcified atherosclerotic disease of the aorta and coronary arteries. The heart is normal in size. There is no pericardial effusion.   Mediastinum/Nodes: No enlarged mediastinal, hilar, or axillary lymph nodes. Thyroid gland, trachea, and esophagus demonstrate no significant findings.   Lungs/Pleura: Lungs  are clear. No pleural effusion or pneumothorax.   Upper Abdomen: Small enhancing area in the liver measuring 1.3 x 1.2 cm image 4/144 is unchanged. No acute abnormality in the upper abdomen.   Musculoskeletal: There are multiple stable healed left-sided rib fractures. No acute osseous abnormality.   Review of the MIP images confirms the above findings.   IMPRESSION: 1. Stable 4.4 x 4.5 cm ascending thoracic aortic aneurysm.  Recommend semi-annual imaging followup by CTA or MRA and referral to cardiothoracic surgery if not already obtained. This recommendation follows 2010 ACCF/AHA/AATS/ACR/ASA/SCA/SCAI/SIR/STS/SVM Guidelines for the Diagnosis and Management of Patients With Thoracic Aortic Disease. Circulation. 2010; 121: B147-W29. Aortic aneurysm NOS (ICD10-I71.9) 2. Stable small enhancing area in the liver, possibly a flash filling hemangioma. 3. No acute process in the chest.   Aortic Atherosclerosis (ICD10-I70.0).     Electronically Signed   By: Darliss Cheney M.D.   On: 12/23/2022 16:03   A/P: Ascending thoracic aortic aneurysm: Mr. Frampton presents to the clinic with a stable 4.4cmx4.5cm ascending aortic aneurysm. His last echocardiogram from 07/16/21 shows a bicuspid aortic valve with evidence of mild aortic regurgitation. We discussed the natural history and and risk factors for growth of ascending aortic aneurysms.  We covered the importance of smoking cessation, tight blood pressure control, refraining from lifting heavy objects, and avoiding fluoroquinolones.  The patient is aware of signs and symptoms of aortic dissection and when to present to the emergency department.  We will continue surveillance and a repeat CTA was ordered for 6 months.  HTN: BP controlled in the office today and overall well controlled at home. Continue close follow up with cardiology and PCP. Told him to discuss dizzy spells with PCP.   Risk Modification:  Statin:  Rosuvastatin 20mg   Smoking cessation instruction/counseling given: Quit in 2015  Patient was counseled on importance of Blood Pressure Control.  Despite Medical intervention if the patient notices persistently elevated blood pressure readings.  They are instructed to contact their Primary Care Physician  Please avoid use of Fluoroquinolones as this can potentially increase your risk of Aortic Rupture and/or Dissection  Patient educated on signs and symptoms  of Aortic Dissection, handout also provided in AVS  Jenny Reichmann, PA-C 12/22/22

## 2022-12-23 ENCOUNTER — Ambulatory Visit
Admission: RE | Admit: 2022-12-23 | Discharge: 2022-12-23 | Disposition: A | Discharge status: Home / Self Care | Payer: Medicare HMO | Source: Ambulatory Visit | Attending: Thoracic Surgery (Cardiothoracic Vascular Surgery) | Admitting: Thoracic Surgery (Cardiothoracic Vascular Surgery)

## 2022-12-23 DIAGNOSIS — I7121 Aneurysm of the ascending aorta, without rupture: Secondary | ICD-10-CM

## 2022-12-23 MED ORDER — IOPAMIDOL (ISOVUE-370) INJECTION 76%
75.0000 mL | Freq: Once | INTRAVENOUS | Status: AC | PRN
  Administered 2022-12-23: 75 mL via INTRAVENOUS

## 2022-12-25 ENCOUNTER — Encounter: Payer: Self-pay | Admitting: Family Medicine

## 2022-12-25 ENCOUNTER — Other Ambulatory Visit: Payer: Self-pay

## 2022-12-25 DIAGNOSIS — R972 Elevated prostate specific antigen [PSA]: Secondary | ICD-10-CM | POA: Diagnosis not present

## 2022-12-25 DIAGNOSIS — R3915 Urgency of urination: Secondary | ICD-10-CM | POA: Diagnosis not present

## 2022-12-25 DIAGNOSIS — N401 Enlarged prostate with lower urinary tract symptoms: Secondary | ICD-10-CM | POA: Diagnosis not present

## 2022-12-25 DIAGNOSIS — F339 Major depressive disorder, recurrent, unspecified: Secondary | ICD-10-CM

## 2022-12-25 DIAGNOSIS — N3289 Other specified disorders of bladder: Secondary | ICD-10-CM | POA: Diagnosis not present

## 2022-12-25 NOTE — Telephone Encounter (Signed)
Referral has been placed. 

## 2022-12-28 ENCOUNTER — Ambulatory Visit: Payer: Medicare HMO | Admitting: Physician Assistant

## 2022-12-28 ENCOUNTER — Encounter: Payer: Self-pay | Admitting: Physician Assistant

## 2022-12-28 VITALS — BP 138/73 | HR 62 | Resp 18 | Ht 70.0 in | Wt 170.0 lb

## 2022-12-28 DIAGNOSIS — I7121 Aneurysm of the ascending aorta, without rupture: Secondary | ICD-10-CM

## 2022-12-28 DIAGNOSIS — R3915 Urgency of urination: Secondary | ICD-10-CM | POA: Diagnosis not present

## 2022-12-28 NOTE — Patient Instructions (Signed)
Risk Modification in those with ascending thoracic aortic aneurysm:  Continue good control of blood pressure (prefer SBP 130/80 or less)  2. Avoid fluoroquinolone antibiotics (I.e Ciprofloxacin, Avelox, Levofloxacin, Ofloxacin)  3.  Use of statin (to decrease cardiovascular risk)  4.  Exercise and activity limitations is individualized, but in general, contact sports are to be  avoided and one should avoid heavy lifting (defined as half of ideal body weight) and exercises involving sustained Valsalva maneuver.  5. Counseling for those suspected of having genetically mediated disease. First-degree relatives of those with TAA disease should be screened as well as those who have a connective tissue disease (I.e with Marfan syndrome, Ehlers-Danlos syndrome,  and Loeys-Dietz syndrome) or a  bicuspid aortic valve,have an increased risk for  complications related to TAA  6. If one has tobacco abuse, smoking cessation is highly encouraged.  

## 2022-12-29 NOTE — Progress Notes (Unsigned)
Cardiology Office Note   Date:  12/30/2022   ID:  Eugene Guzman., DOB [birth year], MRN [id number]  PCP:  Eugene Sow, MD  Cardiologist:   Eugene Pates, MD   Patient presents for follow up of CAD    History of Present Illness: Eugene Guzman. is a 67 y.o. male  CAD.  Pt has a calcium score CT in 2021  score was 2118 (97th percentile for age)   Calcificatons noted in all coronary arteries.   He had a myoview done after that showed normal perfusion   In addition the CT showed asc aorta was dilated at 4.5 cm   he has been followed by Eugene Guzman with serial CT exams    The pt is also followed by psych for anxiety, depression In 2023 he went on to have a PET/CT cardiac stress test  This  showed normal perfuision     LVEF normal  No TID    Flow reserve was down a little 1.88   With the abnormal flow reserve and severe elevation of calcium he had LHC  This showed moderae CAD (60% LAD with FFR being insignificant)  LVEF normal   LVEDP normal    Pt on amldipoine already   I saw the pt  in clinic in Dec 2023  Since then he has been diagnosed with CLL   Follows with Dr Eugene Guzman   may seek a second opinion Has a relative with CLL who goes to [facility]     He denies CP   Breathing is fair   he is vary anxious   Mother is also ill     He does not do much     Being evaluated for urinary retention   has appt with Dr Berneice Heinrich soon    Placed on Proscar and Flomax   Notes occasional dizziness   Current Meds  Medication Sig   amLODipine (NORVASC) 2.5 MG tablet Take 1 tablet (2.5 mg total) by mouth daily.   aspirin EC 81 MG tablet Take 1 tablet (81 mg total) by mouth daily. Swallow whole.   Cholecalciferol (VITAMIN D-3) 125 MCG (5000 UT) TABS Take by mouth.   clonazePAM (KLONOPIN) 0.5 MG tablet    ezetimibe (ZETIA) 10 MG tablet Take 1 tablet (10 mg total) by mouth daily.   finasteride (PROSCAR) 5 MG tablet Take 5 mg by mouth daily.   fluvoxaMINE (LUVOX) 100 MG tablet Take 100 mg by mouth at  bedtime.   lamoTRIgine (LAMICTAL) 200 MG tablet Take 200 mg by mouth daily.   multivitamin (ONE-A-DAY MEN'S) TABS tablet Take 1 tablet by mouth daily.   niacin (TRUE VITAMIN B3) 100 MG tablet    PREVIDENT 5000 BOOSTER PLUS 1.1 % PSTE Place onto teeth.   rosuvastatin (CRESTOR) 20 MG tablet Take 1 tablet (20 mg total) by mouth daily.   tamsulosin (FLOMAX) 0.4 MG CAPS capsule Take 0.4 mg by mouth daily.   WELLBUTRIN XL 150 MG 24 hr tablet 150 mg every other day.     Allergies:   Levofloxacin, Penicillins, and Latex   Past Medical History:  Diagnosis Date   Anxiety    Aortic aneurysm [facility])    Coronary artery calcification seen on CT scan    Depressed    Hypertension     Past Surgical History:  Procedure Laterality Date   CORONARY PRESSURE/FFR STUDY N/A 04/28/2022   Procedure: INTRAVASCULAR PRESSURE WIRE/FFR STUDY;  Surgeon: [name], MD;  Location: [facility]  INVASIVE CV LAB;  Service: Cardiovascular;  Laterality: N/A;   LEFT HEART CATH AND CORONARY ANGIOGRAPHY N/A 04/28/2022   Procedure: LEFT HEART CATH AND CORONARY ANGIOGRAPHY;  Surgeon: [name], MD;  Location: [facility] INVASIVE CV LAB;  Service: Cardiovascular;  Laterality: N/A;   TONSILLECTOMY  1961     Social History:  The patient  reports that he quit smoking about 9 years ago. His smoking use included cigars. He has never used smokeless tobacco. He reports current alcohol use of about 10.0 standard drinks of alcohol per week. He reports that he does not use drugs.   Family History:  The patient's family history includes Colon cancer (age of onset: 71) in his paternal grandfather; Heart attack (age of onset: 20) in his father; Hypertension in his mother; Ovarian cancer in his maternal grandmother.    ROS:  Please see the history of present illness. All other systems are reviewed and  Negative to the above problem except as noted.    PHYSICAL EXAM: VS:  BP 128/62   Pulse 65   Ht 5\' 10"  (1.778 m)   Wt 166 lb 9.6 oz  (75.6 kg)   SpO2 98%   BMI 23.90 kg/m   GEN:  Thin 67 yo in no acute distress  HEENT: normal  Neck: no JVD, no carotid bruit Cardiac: RRR; no murmur  No LE edema  Respiratory:  clear to auscultation  GI: soft, nontender, nondistended, + BS  No hepatomegaly  MS: no deformity Moving all extremities   Skin: warm and dry, no rash Neuro:  Strength and sensation are intact Psych: euthymic mood, full affect   EKG:  EKG not done today   Cardiac Cath   04/28/22  POST-OP DIAGNOSIS Correct findings on stress PET: No obstructive disease Moderate multivessel CAD, calcified: Mid LAD 60% (RFR 0.94-not significant, apical LAD RFR was still not significant-0.92.), ostial LCx 55%, mid RCA 55%. Normal LV function, normal LVEDP     RECOMMENDATIONS Continue to titrate medications for cardiac risk factor reduction Consider increasing calcium channel blocker for likely microvascular disease based on stress PET results.     Lipid Panel   Last lipds in May LDL 134  HDL 67  Trig 141   LDL in 2725 was 172      Component Value Date/Time   CHOL 166 05/06/2021 1136   CHOL 160 03/04/2021 1117   TRIG 52.0 05/06/2021 1136   HDL 83.40 05/06/2021 1136   HDL 92 03/04/2021 1117   CHOLHDL 2 05/06/2021 1136   VLDL 10.4 05/06/2021 1136   LDLCALC 72 05/06/2021 1136   LDLCALC 54 03/04/2021 1117      Wt Readings from Last 3 Encounters:  12/30/22 166 lb 9.6 oz (75.6 kg)  12/28/22 170 lb (77.1 kg)  12/04/22 164 lb (74.4 kg)      ASSESSMENT AND PLAN:   1  CAD   Pt with severe coronary calcifications  Moderate CAD at cath  Follow   I am not convinced he is having angina    2   Thoracic aneurysm   Follows with S Hendrickson  Aorta stable at 4.5 this spring   3  HTN   BP is adequately controlled   4  HL    Excellent control  LDL 49   Follow on current regimen   5  Psych  Will inquire re counsellig   6  CLL   Seeing Dr Eugene Guzman   Plan to watch, get labs  I have encouraged him to get to walking    I think he would beenefit    I will set for follow up in the summer     Limit sugar and processed foods      Stay active   Current medicines are reviewed at length with the patient today.  The patient does not have concerns regarding medicines.  Signed, Eugene Pates, MD  12/30/2022 1:39 PM    [facility] [address] [place], [place], [place]  [address] Phone: [phone]; Fax: [phone]

## 2022-12-30 ENCOUNTER — Ambulatory Visit: Payer: Medicare HMO | Attending: Internal Medicine | Admitting: Internal Medicine

## 2022-12-30 ENCOUNTER — Other Ambulatory Visit: Payer: Self-pay | Admitting: Internal Medicine

## 2022-12-30 ENCOUNTER — Encounter: Payer: Self-pay | Admitting: Internal Medicine

## 2022-12-30 ENCOUNTER — Telehealth: Payer: Self-pay | Admitting: Gastroenterology

## 2022-12-30 VITALS — BP 128/62 | HR 65 | Ht 70.0 in | Wt 166.6 lb

## 2022-12-30 DIAGNOSIS — I2510 Atherosclerotic heart disease of native coronary artery without angina pectoris: Secondary | ICD-10-CM | POA: Diagnosis not present

## 2022-12-30 NOTE — Telephone Encounter (Signed)
Received GI records called patient to advise and ask reason for request. Left voicemail.

## 2022-12-30 NOTE — Telephone Encounter (Signed)
Patient records are under media tab, he is wanting to be seen with Dr Leone Payor for a colonoscopy since Dr Kinnie Scales is retiring. I informed patient that Dr Leone Payor is not accepting new patients so he will look on our website and give a call back with his preferred provider. Will then send a transfer of care

## 2022-12-30 NOTE — Patient Instructions (Addendum)
Medication Instructions:  Your physician recommends that you continue on your current medications as directed. Please refer to the Current Medication list given to you today.  *If you need a refill on your cardiac medications before your next appointment, please call your pharmacy*  Lab Work: None ordered today  Testing/Procedures: None ordered today  Follow-Up: At [facility], you and your health needs are our priority.  As part of our continuing mission to provide you with exceptional heart care, we have created designated Provider Care Teams.  These Care Teams include your primary Cardiologist (physician) and Advanced Practice Providers (APPs -  Physician Assistants and Nurse Practitioners) who all work together to provide you with the care you need, when you need it.  Your next appointment:   6 month(s)  The format for your next appointment:   In Person  Provider:   Paula Ross, MD  

## 2022-12-31 ENCOUNTER — Telehealth: Payer: Self-pay | Admitting: Gastroenterology

## 2022-12-31 NOTE — Telephone Encounter (Signed)
Hi Dr. Russella Dar,  Patient called requesting a transfer of care specifically over to you if possible. States he has GI history with Dr. Kinnie Scales who has now retired. The patients records were obtained and scanned into Media for you to review and advise.   Thank you

## 2022-12-31 NOTE — Telephone Encounter (Signed)
Oh yes, my apology the patient originally asked for Dr. Leone Payor and was advised he was not accepting new patients at this time. The patient preferred to go online to review further, he called back and requested you.

## 2022-12-31 NOTE — Telephone Encounter (Signed)
The records release dated 12/25/2022 lists Dr. Stan Head as the requested physician - please clarify.

## 2022-12-31 NOTE — Telephone Encounter (Signed)
My next available office New patient appt is Oct. 30th so I am not accepting new patients at this time.

## 2023-01-05 ENCOUNTER — Encounter: Payer: Self-pay | Admitting: Gastroenterology

## 2023-01-05 ENCOUNTER — Other Ambulatory Visit: Payer: Self-pay | Admitting: Internal Medicine

## 2023-01-05 NOTE — Telephone Encounter (Signed)
Hi Dr. Adela Lank,  Spoke with the patient's wife who is currently Dr. Marvell Fuller patient, she was advised of Dr. Ardell Isaacs response on not being able to accommodate the transfer request. Therefore the patient's wife is asking if you would accept the patient because they would like to remain within the [facility].   Please advise,  Thanks

## 2023-01-05 NOTE — Telephone Encounter (Signed)
That is okay, I am happy to see him in the office.  Thank you

## 2023-01-12 DIAGNOSIS — R3915 Urgency of urination: Secondary | ICD-10-CM | POA: Diagnosis not present

## 2023-01-12 MED ORDER — SILDENAFIL CITRATE 50 MG PO TABS: 50.0000 mg | ORAL_TABLET | Freq: Every day | ORAL | 2 refills | Status: DC | PRN

## 2023-01-19 DIAGNOSIS — C9110 Chronic lymphocytic leukemia of B-cell type not having achieved remission: Secondary | ICD-10-CM | POA: Diagnosis not present

## 2023-01-19 NOTE — Progress Notes (Incomplete)
Phone: [phone]   Subjective:  Patient 67 y.o. male presenting for annual physical.  No chief complaint on file.  Annual   HTN - Pt is on ***.  Bp's running ***.  No ha/dizziness/cp/palp/edema/cough/sob ***  *** - ***  *** - ***   See problem oriented charting- ROS- ROS: Gen: no fever, chills  Skin: no rash, itching ENT: no ear pain, ear drainage, nasal congestion, rhinorrhea, sinus pressure, sore throat Eyes: no blurry vision, double vision Resp: no cough, wheeze,SOB CV: no CP, palpitations, LE edema,  GI: no heartburn, n/v/d/c, abd pain GU: no dysuria, urgency, frequency, hematuria MSK: no joint pain, myalgias, back pain Neuro: no dizziness, headache, weakness, vertigo Psych: no depression, anxiety, insomnia, SI   The following were reviewed and entered/updated in epic: Past Medical History:  Diagnosis Date   Anxiety    Aortic aneurysm (HCC)    Coronary artery calcification seen on CT scan    Depressed    Hypertension    Patient Active Problem List   Diagnosis Date Noted   Abnormal nuclear stress test    Atypical angina    Panic disorder without agoraphobia 03/02/2022   Depression 03/02/2022   Fatigue 03/02/2022   Severe sleep apnea 02/19/2022   Severe obstructive sleep apnea-hypopnea syndrome 09/10/2021   Chronic intermittent hypoxia with obstructive sleep apnea 09/10/2021   Sleep apnea, obstructive 07/22/2021   Snoring 07/22/2021   Vitamin D deficiency 05/07/2021   Hypertension 01/31/2021   BPH (benign prostatic hyperplasia) 01/31/2021   Depression, recurrent (HCC) 01/31/2021   Hyperlipidemia 01/31/2021   CAD (coronary artery disease) 01/31/2021   Ascending aortic aneurysm (HCC) 01/31/2021   Upper airway cough syndrome 07/19/2015   Past Surgical History:  Procedure Laterality Date   CORONARY PRESSURE/FFR STUDY N/A 04/28/2022   Procedure: INTRAVASCULAR PRESSURE WIRE/FFR STUDY;  Surgeon: [name], MD;  Location: [facility] INVASIVE CV LAB;   Service: Cardiovascular;  Laterality: N/A;   LEFT HEART CATH AND CORONARY ANGIOGRAPHY N/A 04/28/2022   Procedure: LEFT HEART CATH AND CORONARY ANGIOGRAPHY;  Surgeon: [name], MD;  Location: [facility] INVASIVE CV LAB;  Service: Cardiovascular;  Laterality: N/A;   TONSILLECTOMY  1961    Family History  Problem Relation Age of Onset   Hypertension Mother    Heart attack Father 34   Ovarian cancer Maternal Grandmother    Colon cancer Paternal Grandfather 24    Medications- reviewed and updated Current Outpatient Medications  Medication Sig Dispense Refill   sildenafil (VIAGRA) 50 MG tablet Take 1 tablet (50 mg total) by mouth daily as needed for erectile dysfunction (Use 1 hour before sexual activity.). 30 tablet 2   amLODipine (NORVASC) 2.5 MG tablet Take 1 tablet (2.5 mg total) by mouth daily. 90 tablet 3   aspirin EC 81 MG tablet Take 1 tablet (81 mg total) by mouth daily. Swallow whole. 90 tablet 3   Cholecalciferol (VITAMIN D-3) 125 MCG (5000 UT) TABS Take by mouth.     clonazePAM (KLONOPIN) 0.5 MG tablet      ezetimibe (ZETIA) 10 MG tablet Take 1 tablet (10 mg total) by mouth daily. 90 tablet 3   finasteride (PROSCAR) 5 MG tablet Take 5 mg by mouth daily.     fluvoxaMINE (LUVOX) 100 MG tablet Take 100 mg by mouth at bedtime.     lamoTRIgine (LAMICTAL) 200 MG tablet Take 200 mg by mouth daily.     multivitamin (ONE-A-DAY MEN'S) TABS tablet Take 1 tablet by mouth daily.  niacin (TRUE VITAMIN B3) 100 MG tablet      PREVIDENT 5000 BOOSTER PLUS 1.1 % PSTE Place onto teeth.     rosuvastatin (CRESTOR) 20 MG tablet Take 1 tablet (20 mg total) by mouth daily. 90 tablet 3   tamsulosin (FLOMAX) 0.4 MG CAPS capsule Take 0.4 mg by mouth daily.     WELLBUTRIN XL 150 MG 24 hr tablet 150 mg every other day.     No current facility-administered medications for this visit.    Allergies-reviewed and updated Allergies  Allergen Reactions   Levofloxacin     Avoid fluoroquinolone antibiotics  due to thoracic aortic aneurysm   Penicillins Other (See Comments)    Childhood   Latex Rash    Social History   Social History Narrative   Lives with wife   Right handed   Caffeine: ice tea daily   Objective  Objective:  There were no vitals taken for this visit. Physical Exam  Gen: WDWN NAD HEENT: NCAT, conjunctiva not injected, sclera nonicteric TM WNL B, OP moist, no exudates  NECK:  supple, no thyromegaly, no nodes, no carotid bruits CARDIAC: RRR, S1S2+, no murmur. DP 2+B LUNGS: CTAB. No wheezes ABDOMEN:  BS+, soft, NTND, No HSM, no masses EXT:  no edema MSK: no gross abnormalities. MS 5/5 all 4 NEURO: A&O x3.  CN II-XII intact.  PSYCH: normal mood. Good eye contact     Assessment and Plan   Health Maintenance counseling: 1. Anticipatory guidance: Patient counseled regarding regular dental exams q6 months, eye exams yearly, avoiding smoking and second hand smoke, limiting alcohol to 2 beverages per day.   2. Risk factor reduction:  Advised patient of need for regular exercise and diet rich in fruits and vegetables to reduce risk of heart attack and stroke. Exercise- ***.   Wt Readings from Last 3 Encounters:  12/30/22 166 lb 9.6 oz (75.6 kg)  12/28/22 170 lb (77.1 kg)  12/04/22 164 lb (74.4 kg)   3. Immunizations/screenings/ancillary studies Immunization History  Administered Date(s) Administered   Influenza, High Dose Seasonal PF 07/08/2022   Influenza, Seasonal, Injecte, Preservative Fre 04/11/2014   Influenza,inj,Quad PF,6+ Mos 05/14/2015, 04/11/2016   Influenza-Unspecified 04/22/2021   PFIZER(Purple Top)SARS-COV-2 Vaccination 08/07/2019, 08/28/2019, 05/07/2020, 10/08/2020, 03/18/2021   PNEUMOCOCCAL CONJUGATE-20 05/06/2021   Zoster Recombinant(Shingrix) 12/04/2020, 06/10/2021   Health Maintenance Due  Topic Date Due   DTaP/Tdap/Td (1 - Tdap) Never done   Colonoscopy  03/01/2023    4. Prostate cancer screening >55yo - risk factors? No results found for:  "PSA"  5. Colon cancer screening: *** 6. Skin cancer screening- I advised regular sunscreen use. Denies worrisome, changing, or new skin lesions.  7. Smoking associated screening (lung cancer screening, AAA screen 65-75, UA)- *** smoker- ***ppd 8. STD screening - ***  There are no diagnoses linked to this encounter.  Recommended follow up: No follow-ups on file.  Lab/Order associations: ***fasting    I,Rachel Rivera,acting as a scribe for Angelena Sole, MD.,have documented all relevant documentation on the behalf of Angelena Sole, MD,as directed by  Angelena Sole, MD while in the presence of Angelena Sole, MD.  I, Isabelle Course, have reviewed all documentation for this visit. The documentation on 01/19/23 for the exam, diagnosis, procedures, and orders are all accurate and complete.  *** Isabelle Course

## 2023-01-20 ENCOUNTER — Encounter: Payer: Self-pay | Admitting: Family Medicine

## 2023-01-20 ENCOUNTER — Other Ambulatory Visit: Payer: Self-pay | Admitting: Urology

## 2023-01-20 ENCOUNTER — Ambulatory Visit (INDEPENDENT_AMBULATORY_CARE_PROVIDER_SITE_OTHER): Payer: Medicare HMO | Admitting: Family Medicine

## 2023-01-20 VITALS — BP 140/60 | HR 68 | Temp 98.1°F | Resp 20 | Ht 70.0 in | Wt 171.5 lb

## 2023-01-20 DIAGNOSIS — I10 Essential (primary) hypertension: Secondary | ICD-10-CM

## 2023-01-20 DIAGNOSIS — I609 Nontraumatic subarachnoid hemorrhage, unspecified: Secondary | ICD-10-CM

## 2023-01-20 DIAGNOSIS — E7800 Pure hypercholesterolemia, unspecified: Secondary | ICD-10-CM | POA: Diagnosis not present

## 2023-01-20 DIAGNOSIS — S0990XA Unspecified injury of head, initial encounter: Secondary | ICD-10-CM

## 2023-01-20 DIAGNOSIS — S2020XA Contusion of thorax, unspecified, initial encounter: Secondary | ICD-10-CM

## 2023-01-20 DIAGNOSIS — R42 Dizziness and giddiness: Secondary | ICD-10-CM | POA: Diagnosis not present

## 2023-01-20 LAB — CBC WITH DIFFERENTIAL/PLATELET
Basophils Absolute: 0 10*3/uL (ref 0.0–0.1)
Basophils Relative: 0.3 % (ref 0.0–3.0)
Eosinophils Absolute: 0.1 10*3/uL (ref 0.0–0.7)
Eosinophils Relative: 0.6 % (ref 0.0–5.0)
HCT: 38.4 % — ABNORMAL LOW (ref 39.0–52.0)
Hemoglobin: 12.6 g/dL — ABNORMAL LOW (ref 13.0–17.0)
Lymphocytes Relative: 62.8 % — ABNORMAL HIGH (ref 12.0–46.0)
Lymphs Abs: 9.8 10*3/uL — ABNORMAL HIGH (ref 0.7–4.0)
MCHC: 32.8 g/dL (ref 30.0–36.0)
MCV: 94.6 fl (ref 78.0–100.0)
Monocytes Absolute: 0.9 10*3/uL (ref 0.1–1.0)
Monocytes Relative: 5.5 % (ref 3.0–12.0)
Neutro Abs: 4.8 10*3/uL (ref 1.4–7.7)
Neutrophils Relative %: 30.8 % — ABNORMAL LOW (ref 43.0–77.0)
Platelets: 160 10*3/uL (ref 150.0–400.0)
RBC: 4.06 Mil/uL — ABNORMAL LOW (ref 4.22–5.81)
RDW: 14.8 % (ref 11.5–15.5)
WBC: 15.6 10*3/uL — ABNORMAL HIGH (ref 4.0–10.5)

## 2023-01-20 LAB — COMPREHENSIVE METABOLIC PANEL
ALT: 43 U/L (ref 0–53)
AST: 51 U/L — ABNORMAL HIGH (ref 0–37)
Albumin: 4.5 g/dL (ref 3.5–5.2)
Alkaline Phosphatase: 73 U/L (ref 39–117)
BUN: 11 mg/dL (ref 6–23)
CO2: 21 mEq/L (ref 19–32)
Calcium: 9.3 mg/dL (ref 8.4–10.5)
Chloride: 100 mEq/L (ref 96–112)
Creatinine, Ser: 0.76 mg/dL (ref 0.40–1.50)
GFR: 91.89 mL/min (ref >60.00)
Glucose, Bld: 85 mg/dL (ref 70–99)
Potassium: 4.2 mEq/L (ref 3.5–5.1)
Sodium: 136 mEq/L (ref 135–145)
Total Bilirubin: 0.9 mg/dL (ref 0.2–1.2)
Total Protein: 7 g/dL (ref 6.0–8.3)

## 2023-01-20 LAB — LIPID PANEL
Cholesterol: 157 mg/dL (ref 0–200)
HDL: 110.5 mg/dL (ref >39.00)
LDL Cholesterol: 37 mg/dL (ref 0–99)
NonHDL: 46.31
Total CHOL/HDL Ratio: 1
Triglycerides: 47 mg/dL (ref 0.0–149.0)
VLDL: 9.4 mg/dL (ref 0.0–40.0)

## 2023-01-20 LAB — TSH: TSH: 2.05 u[IU]/mL (ref 0.35–5.50)

## 2023-01-20 NOTE — Patient Instructions (Signed)
Ordering CT head

## 2023-01-20 NOTE — Progress Notes (Addendum)
Subjective:     Patient ID: Eugene Amos., male    DOB: [birth year], 67 y.o.   MRN: [id number]  Chief Complaint  Patient presents with   Annual Exam    CPE Not fasting    Dizziness    Larey Seat 2 nights ago, think he hit his head, dizziness off and on for a long time now     HPI He is accompanied by his wife.   HTN - Pt is on Amlodipine 2.5 mg.  Bp's running around 138 systolic at home. No ha/cp/palp/edema/cough/sob. At initial check his blood pressure was 140/60. When rechecked his blood pressure improved. Orthostatics were done.  Falls - He reports a fall on 7/16 due to dizziness. He typically goes to sleep on the couch and wakes up to go to bed around 1 am. His mom passed away and pt so grief stricken, wasn't eating nor drinking much.  He got up from sleeping and fell forwards into the TV and bumped into many objects after multiple attempts to get back up. He is unsure whether he hit his head or not. Denies any loss of consciousness. He notes he had to crawl on the floor to the bottom of the stairs to alert his wife-who was sleeping upstairs.Marland Kitchen He endorses pain and tenderness on left side. He reports about 5-6 falls since his last visit on 5/6. Gets dizzy when gets up-has been started on meds from [facility]rol for BPH.   Dizziness - He complains of intermittent dizziness for some time now. He attributes many of his falls to dizziness. Has not been eating well or staying hydrated.   Grief - He reports his mother passed on Feb 23, 2023, who he was very close with. He has not been eating or drinking water since her passing due to immense grief. Will start talk therapy next week. Has been drinking about 3-4 beers a night since his mother's passing.   Nocturia - Has to use the bathroom multiple times at nighttime. Is on tamsulosin 0.4 mg daily. And proscar.  Will be getting TURP   Health Maintenance Due  Topic Date Due   DTaP/Tdap/Td (1 - Tdap) Never done   Colonoscopy  03/01/2023    Past  Medical History:  Diagnosis Date   Allergy    childhood - penicillin, 2014 - latex   Anxiety    Aortic aneurysm (HCC)    Arthritis 2014   right shoulder   CLL (chronic lymphocytic leukemia) (HCC)    Coronary artery calcification seen on CT scan    Depressed    Hyperlipidemia    Hypertension    Sleep apnea 2022   Have stopped using CPAP    Past Surgical History:  Procedure Laterality Date   CORONARY PRESSURE/FFR STUDY N/A 04/28/2022   Procedure: INTRAVASCULAR PRESSURE WIRE/FFR STUDY;  Surgeon: [name], MD;  Location: [facility];  Service: Cardiovascular;  Laterality: N/A;   LEFT HEART CATH AND CORONARY ANGIOGRAPHY N/A 04/28/2022   Procedure: LEFT HEART CATH AND CORONARY ANGIOGRAPHY;  Surgeon: [name], MD;  Location: [facility] INVASIVE CV LAB;  Service: Cardiovascular;  Laterality: N/A;   TONSILLECTOMY  1961     Current Outpatient Medications:    amLODipine (NORVASC) 2.5 MG tablet, Take 1 tablet (2.5 mg total) by mouth daily., Disp: 90 tablet, Rfl: 3   aspirin EC 81 MG tablet, Take 1 tablet (81 mg total) by mouth daily. Swallow whole., Disp: 90 tablet, Rfl: 3   B Complex Vitamins (  B COMPLEX 100 PO), Take 100 mg by mouth daily., Disp: , Rfl:    Cholecalciferol (VITAMIN D-3 PO), Take 2,000 Units by mouth daily., Disp: , Rfl:    ezetimibe (ZETIA) 10 MG tablet, Take 1 tablet (10 mg total) by mouth daily., Disp: 90 tablet, Rfl: 3   finasteride (PROSCAR) 5 MG tablet, Take 5 mg by mouth daily., Disp: , Rfl:    fluvoxaMINE (LUVOX) 100 MG tablet, Take 100 mg by mouth at bedtime., Disp: , Rfl:    lamoTRIgine (LAMICTAL) 200 MG tablet, Take 200 mg by mouth daily., Disp: , Rfl:    multivitamin (ONE-A-DAY MEN'S) TABS tablet, Take 1 tablet by mouth daily., Disp: , Rfl:    niacin (TRUE VITAMIN B3) 100 MG tablet, , Disp: , Rfl:    PREVIDENT 5000 BOOSTER PLUS 1.1 % PSTE, Place onto teeth., Disp: , Rfl:    rosuvastatin (CRESTOR) 20 MG tablet, Take 1 tablet (20 mg total) by mouth  daily., Disp: 90 tablet, Rfl: 3   sildenafil (VIAGRA) 50 MG tablet, Take 1 tablet (50 mg total) by mouth daily as needed for erectile dysfunction (Use 1 hour before sexual activity.)., Disp: 30 tablet, Rfl: 2   tamsulosin (FLOMAX) 0.4 MG CAPS capsule, Take 0.4 mg by mouth daily., Disp: , Rfl:    WELLBUTRIN XL 150 MG 24 hr tablet, 150 mg every other day., Disp: , Rfl:    clonazePAM (KLONOPIN) 0.5 MG tablet, , Disp: , Rfl:   Allergies  Allergen Reactions   Levofloxacin     Avoid fluoroquinolone antibiotics due to thoracic aortic aneurysm   Penicillins Other (See Comments)    Childhood   Latex Rash   ROS neg/noncontributory except as noted HPI/below      Objective:     BP (!) 140/60   Pulse 68   Temp 98.1 F (36.7 C) (Temporal)   Resp 20   Ht 5\' 10"  (1.778 m)   Wt 171 lb 8 oz (77.8 kg)   SpO2 98%   BMI 24.61 kg/m  Wt Readings from Last 3 Encounters:  01/20/23 171 lb 8 oz (77.8 kg)  12/30/22 166 lb 9.6 oz (75.6 kg)  12/28/22 170 lb (77.1 kg)    Physical Exam   Gen: WDWN NAD HEENT: NCAT, conjunctiva not injected, sclera nonicteric NECK:  supple, no thyromegaly, no nodes, no carotid bruits CARDIAC: RRR, S1S2+, no murmur. DP 2+B LUNGS: CTAB. No wheezes ABDOMEN:  BS+, soft, NTND, No HSM, no masses EXT:  no edema MSK: no gross abnormalities.  NEURO: A&O x3.  CN II-XII intact.  PSYCH: normal mood. Good eye contact  Extensive bruising on back of left axilla to hip. Small bruise on left forehead.  Also, bruise on R flank.  Healing abrasion L back and wrist area.   Orthostatic VS for the past 72 hrs (Last 3 readings):  Orthostatic BP Patient Position BP Location Cuff Size  01/20/23 1023 120/68 Standing Right Arm Large  01/20/23 1022 112/60 Standing Right Arm Large  01/20/23 1021 124/68 Sitting Right Arm Large      Assessment & Plan:  Multiple contusions of trunk, initial encounter  Traumatic injury of head, initial encounter -     CBC with Differential/Platelet -      CT HEAD WO CONTRAST ( ); Future  Dizziness  Primary hypertension -     Comprehensive metabolic panel  Pure hypercholesterolemia -     Comprehensive metabolic panel -     Lipid panel -     TSH  Multiple contusions on back from falling.  Discussed the healing process.  Will check CBC.  Blood pressure drops a little bit upon standing.  Advised to make sure you are drinking plenty of water, limiting alcohol, and eating regular meals, and getting up slowly. Traumatic injury of head-status post fall.  Does not think he lost consciousness.  No focal deficits.  Wife is concerned as well.  Will check CT of the brain.  He has to get to the funeral home for the viewing of his mother and then the funeral is tomorrow, so we will see what we can do about scheduling. Dizziness-multifactorial.  Patient is on several psych drugs, and antihypertensive and BPH meds, had not been eating or drinking well.  Grief stricken.  Advised to get up slowly, eat and drink. 4.  Hypertension-chronic.  Controlled.  Continue amlodipine 2.5 mg daily.  He does have an ascending thoracic aneurysm need to keep blood pressures at goal. Hyperlipidemia-coronary calcifications on CTA.  Also with the ascending thoracic aneurysm continue Zetia 10 mg daily and rosuvastatin 20 mg daily.  Check labs Spent 40 minutes with patient and wife reviewing chart, getting history, exam, coordinating care for CT of the brain, offering support  Return in about 4 weeks (around 02/17/2023) for annual physical.   I,Rachel Rivera,acting as a scribe for Angelena Sole, MD.,have documented all relevant documentation on the behalf of Angelena Sole, MD,as directed by  Angelena Sole, MD while in the presence of Angelena Sole, MD.  I, Angelena Sole, MD, have reviewed all documentation for this visit. The documentation on 01/20/23 for the exam, diagnosis, procedures, and orders are all accurate and complete.   Angelena Sole, MD

## 2023-01-21 ENCOUNTER — Ambulatory Visit (HOSPITAL_BASED_OUTPATIENT_CLINIC_OR_DEPARTMENT_OTHER): Payer: Medicare HMO

## 2023-01-21 NOTE — Progress Notes (Signed)
Again, so sorry about Mom. Labs ok/stable except:  liver test increased-need to stop drinking alcohol Hemoglobin dropped some-may be from all the bruising.  Let me know labs from [facility] next month to make sure not a problem.

## 2023-01-22 ENCOUNTER — Emergency Department (HOSPITAL_BASED_OUTPATIENT_CLINIC_OR_DEPARTMENT_OTHER)
Admission: EM | Admit: 2023-01-22 | Discharge: 2023-01-22 | Disposition: A | Discharge status: Home / Self Care | Payer: Medicare HMO | Attending: Emergency Medicine | Admitting: Emergency Medicine

## 2023-01-22 ENCOUNTER — Emergency Department (HOSPITAL_BASED_OUTPATIENT_CLINIC_OR_DEPARTMENT_OTHER): Payer: Medicare HMO

## 2023-01-22 ENCOUNTER — Other Ambulatory Visit: Payer: Self-pay

## 2023-01-22 ENCOUNTER — Ambulatory Visit (HOSPITAL_BASED_OUTPATIENT_CLINIC_OR_DEPARTMENT_OTHER)
Admission: RE | Admit: 2023-01-22 | Discharge: 2023-01-22 | Disposition: A | Discharge status: Home / Self Care | Payer: Medicare HMO | Source: Ambulatory Visit | Attending: Family Medicine | Admitting: Family Medicine

## 2023-01-22 ENCOUNTER — Encounter (HOSPITAL_BASED_OUTPATIENT_CLINIC_OR_DEPARTMENT_OTHER): Payer: Self-pay | Admitting: Emergency Medicine

## 2023-01-22 ENCOUNTER — Emergency Department (HOSPITAL_BASED_OUTPATIENT_CLINIC_OR_DEPARTMENT_OTHER): Payer: Medicare HMO | Admitting: Radiology

## 2023-01-22 DIAGNOSIS — Z7982 Long term (current) use of aspirin: Secondary | ICD-10-CM | POA: Insufficient documentation

## 2023-01-22 DIAGNOSIS — G9389 Other specified disorders of brain: Secondary | ICD-10-CM | POA: Diagnosis not present

## 2023-01-22 DIAGNOSIS — S2242XA Multiple fractures of ribs, left side, initial encounter for closed fracture: Secondary | ICD-10-CM | POA: Diagnosis not present

## 2023-01-22 DIAGNOSIS — Z91040 Latex allergy status: Secondary | ICD-10-CM | POA: Insufficient documentation

## 2023-01-22 DIAGNOSIS — Z043 Encounter for examination and observation following other accident: Secondary | ICD-10-CM | POA: Diagnosis not present

## 2023-01-22 DIAGNOSIS — W01190A Fall on same level from slipping, tripping and stumbling with subsequent striking against furniture, initial encounter: Secondary | ICD-10-CM | POA: Insufficient documentation

## 2023-01-22 DIAGNOSIS — S0990XA Unspecified injury of head, initial encounter: Secondary | ICD-10-CM | POA: Insufficient documentation

## 2023-01-22 DIAGNOSIS — I10 Essential (primary) hypertension: Secondary | ICD-10-CM | POA: Insufficient documentation

## 2023-01-22 DIAGNOSIS — R0781 Pleurodynia: Secondary | ICD-10-CM | POA: Diagnosis not present

## 2023-01-22 DIAGNOSIS — S066X0D Traumatic subarachnoid hemorrhage without loss of consciousness, subsequent encounter: Secondary | ICD-10-CM | POA: Diagnosis not present

## 2023-01-22 DIAGNOSIS — I2510 Atherosclerotic heart disease of native coronary artery without angina pectoris: Secondary | ICD-10-CM | POA: Diagnosis not present

## 2023-01-22 DIAGNOSIS — S066X0A Traumatic subarachnoid hemorrhage without loss of consciousness, initial encounter: Secondary | ICD-10-CM | POA: Diagnosis not present

## 2023-01-22 DIAGNOSIS — Z79899 Other long term (current) drug therapy: Secondary | ICD-10-CM | POA: Insufficient documentation

## 2023-01-22 DIAGNOSIS — W19XXXA Unspecified fall, initial encounter: Secondary | ICD-10-CM

## 2023-01-22 DIAGNOSIS — M1711 Unilateral primary osteoarthritis, right knee: Secondary | ICD-10-CM | POA: Diagnosis not present

## 2023-01-22 MED ORDER — ACETAMINOPHEN 325 MG PO TABS
650.0000 mg | ORAL_TABLET | Freq: Once | ORAL | Status: AC
  Administered 2023-01-22: 650 mg via ORAL

## 2023-01-22 NOTE — ED Provider Notes (Signed)
[place] EMERGENCY DEPARTMENT AT [facility] Provider Note   CSN: [id number] Arrival date & time: 01/22/23  1230     History  Chief Complaint  Patient presents with   Eugene Guzman. is a 67 y.o. male.   Fall   67 year old male presents to ED with complaints of fall. Patient states he suffered fall Tuesday morning around 3am when he got up from a standing position and tripped on a nearby ottoman. States he struck the left side of his chest on a piece of furniture and hit the back of his head on the floor. States that he was able to get up and get to bed with the aid of his wife. He was reluctant to be seen because his mother just recently passed and he was grieving and trying to make funeral arragements. States he noticed bruising appear the day after on his left chest/side. Denies any LOC, blood thinner use, visual disturbance, gait abnormalities, slurred speech, facial droop. Denies any cp, shob, abdominal pain, n/v, urinary symptoms, change in bowel habits. States he was seen by PCP earlier today and had abnormal reading on head CT and was told to come to ED for repeat imaging in 6 hours.  PMHx significant for CLL, CAD, hld, htn, ascending aortic aneurysm, osa, depression, bph  Home Medications Prior to Admission medications   Medication Sig Start Date End Date Taking? Authorizing Provider  amLODipine (NORVASC) 2.5 MG tablet Take 1 tablet (2.5 mg total) by mouth daily. 02/10/22   Pricilla Riffle, MD  aspirin EC 81 MG tablet Take 1 tablet (81 mg total) by mouth daily. Swallow whole. 02/10/22   Pricilla Riffle, MD  B Complex Vitamins (B COMPLEX 100 PO) Take 100 mg by mouth daily.    [provider]  Cholecalciferol (VITAMIN D-3 PO) Take 2,000 Units by mouth daily.    [provider]  clonazePAM (KLONOPIN) 0.5 MG tablet     [provider]  ezetimibe (ZETIA) 10 MG tablet Take 1 tablet (10 mg total) by mouth daily. 05/20/22   Pricilla Riffle, MD  finasteride (PROSCAR) 5 MG tablet Take 5 mg by mouth daily.    [provider]  fluvoxaMINE (LUVOX) 100 MG tablet Take 100 mg by mouth at bedtime.    [provider]  lamoTRIgine (LAMICTAL) 200 MG tablet Take 200 mg by mouth daily.    [provider]  multivitamin (ONE-A-DAY MEN'S) TABS tablet Take 1 tablet by mouth daily. 12/03/21   [provider]  niacin (TRUE VITAMIN B3) 100 MG tablet  12/20/22   [provider]  PREVIDENT 5000 BOOSTER PLUS 1.1 % PSTE Place onto teeth. 11/18/22   [provider]  rosuvastatin (CRESTOR) 20 MG tablet Take 1 tablet (20 mg total) by mouth daily. 01/05/23   Pricilla Riffle, MD  sildenafil (VIAGRA) 50 MG tablet Take 1 tablet (50 mg total) by mouth daily as needed for erectile dysfunction (Use 1 hour before sexual activity.). 01/12/23   Pricilla Riffle, MD  tamsulosin (FLOMAX) 0.4 MG CAPS capsule Take 0.4 mg by mouth daily. 12/20/22   [provider]  WELLBUTRIN XL 150 MG 24 hr tablet 150 mg every other day. 07/16/22   [provider]      Allergies    Levofloxacin, Penicillins, and Latex    Review of Systems   Review of Systems  All other systems reviewed and are negative.  Physical Exam Updated Vital Signs BP (!) 144/79   Pulse 75   Temp 97.9 F (36.6 C) (Oral)   Resp 16   SpO2 96%  Physical Exam Vitals and nursing note reviewed.  Constitutional:      General: He is not in acute distress.    Appearance: He is well-developed.  HENT:     Head: Normocephalic and atraumatic.  Eyes:     Conjunctiva/sclera: Conjunctivae normal.  Cardiovascular:     Rate and Rhythm: Normal rate and regular rhythm.  Pulmonary:     Effort: Pulmonary effort is normal. No respiratory distress.     Breath sounds: Normal breath sounds. No wheezing, rhonchi or rales.  Abdominal:     Palpations: Abdomen is soft.     Tenderness: There is no abdominal tenderness. There is no right CVA tenderness, left CVA  tenderness or guarding.  Musculoskeletal:        General: No swelling.     Cervical back: Neck supple.     Comments: No midline ttp of cervical, thoracic, lumbar spine w/o stepoff/deformity. No paraspinal tenderness noted throughout spine. No ttp of upper or lower extremities. Mild left chest wall TTP  Skin:    General: Skin is warm and dry.     Capillary Refill: Capillary refill takes less than 2 seconds.     Comments: Echymosis appreciated left ribs, left elbow  Neurological:     Mental Status: He is alert.     Comments: Alert and oriented to self, place, time and event.   Speech is fluent, clear without dysarthria or dysphasia.   Strength symmetric in upper/lower extremities   Sensation intact in upper/lower extremities   Able to ambulate independently CN I not tested  CN II not tested CN III, IV, VI PERRLA and EOMs intact bilaterally  CN V Intact sensation to sharp and light touch to the face  CN VII facial movements symmetric  CN VIII not tested  CN IX, X no uvula deviation, symmetric rise of soft palate  CN XI symmetric SCM and trapezius strength bilaterally  CN XII Midline tongue protrusion, symmetric L/R movements     Psychiatric:        Mood and Affect: Mood normal.     ED Results / Procedures / Treatments   Labs (all labs ordered are listed, but only abnormal results are displayed) Labs Reviewed - No data to display  EKG None  Radiology DG Knee Complete 4 Views Right  Result Date: 01/22/2023 CLINICAL DATA:  Fall. EXAM: RIGHT KNEE - COMPLETE 4+ VIEW COMPARISON:  None Available. FINDINGS: No evidence of fracture, dislocation, or joint effusion. Vascular calcifications are noted. Moderate narrowing of medial joint space is noted. IMPRESSION: Moderate degenerative joint disease is noted medially. No acute abnormality seen. Electronically Signed   By: Lupita Raider M.D.   On: 01/22/2023 14:27   DG Ribs Unilateral W/Chest Left  Result Date: 01/22/2023 CLINICAL  DATA:  Rib pain after fall. EXAM: LEFT RIBS AND CHEST - 3+ VIEW COMPARISON:  March 04, 2015. FINDINGS: No acute fracture is seen involving left ribs. Multiple old left rib fractures are noted. There is no evidence of pneumothorax or pleural effusion. Both lungs are clear. Heart size and mediastinal contours are within normal limits. IMPRESSION: Multiple old left rib fractures.  No acute fracture is noted. Electronically Signed   By: Lupita Raider M.D.   On: 01/22/2023 14:26   CT Head Wo Contrast  Result Date: 01/22/2023 CLINICAL DATA:  Subdural hematoma. EXAM: CT HEAD WITHOUT CONTRAST TECHNIQUE: Contiguous axial images were obtained from the base of the skull through the vertex without intravenous contrast. RADIATION DOSE REDUCTION: This exam was performed according to the departmental dose-optimization program which includes automated exposure control, adjustment of the mA and/or kV according to patient size and/or use of iterative reconstruction technique. COMPARISON:  CT head 01/22/2023. FINDINGS: Brain: Unchanged trace extra-axial hemorrhage along the parasagittal convexity, possibly layering along the falx. No evidence of new/interval acute hemorrhage or progressive mass effect. No midline shift. No hydrocephalus, acute large vascular territory, or mass lesion. Vascular: No hyperdense vessel. Skull: No acute fracture. Sinuses/Orbits: Clear sinuses.  No acute orbital findings. Other: No mastoid effusions. IMPRESSION: Unchanged trace extra-axial hemorrhage. No evidence of new hemorrhage or mass effect. Electronically Signed   By: Feliberto Harts M.D.   On: 01/22/2023 14:10   CT HEAD WO CONTRAST ( )  Result Date: 01/22/2023 CLINICAL DATA:  Head trauma, minor.  Fall. EXAM: CT HEAD WITHOUT CONTRAST TECHNIQUE: Contiguous axial images were obtained from the base of the skull through the vertex without intravenous contrast. RADIATION DOSE REDUCTION: This exam was performed according to the departmental  dose-optimization program which includes automated exposure control, adjustment of the mA and/or kV according to patient size and/or use of iterative reconstruction technique. COMPARISON:  None Available. FINDINGS: Brain: 5 mm high-density nodule along the parasagittal left frontal lobe consistent with subarachnoid hemorrhage in the setting. No infarct, edema, hydrocephalus, or collection. These results were called by telephone at the time of interpretation on 01/22/2023 at 9:07 am to provider Eugene Guzman , who verbally acknowledged these results. Vascular: No hyperdense vessel or unexpected calcification. Skull: Normal. Negative for fracture or focal lesion. Sinuses/Orbits: No acute finding. IMPRESSION: Trace midline subarachnoid hemorrhage correlating with the traumatic history. Electronically Signed   By: Tiburcio Pea M.D.   On: 01/22/2023 09:07    Procedures Procedures    Medications Ordered in ED Medications  acetaminophen (TYLENOL) tablet 650 mg (650 mg Oral Given 01/22/23 1413)    ED Course/ Medical Decision Making/ A&P                             Medical Decision Making Amount and/or Complexity of Data Reviewed Radiology: ordered.  Risk OTC drugs.   This patient presents to the ED for concern of fall, this involves an extensive number of treatment options, and is a complaint that carries with it a high risk of complications and morbidity.  The differential diagnosis includes fracture, dislocation, strain/sprain, CVA, pneumothorax, solid organ damage   Co morbidities that complicate the patient evaluation  See HPI   Additional history obtained:  Additional history obtained from EMR External records from outside source obtained and reviewed including hospital records   Lab Tests:  N/a   Imaging Studies ordered:  I ordered imaging studies including CT head, right knee x-ray, chest with left ribs I independently visualized and interpreted imaging which showed  CT  head: Unchanged trace extra-axial hemorrhage.  No evidence of new hemorrhagic or mass effect. Right knee x-ray: Degenerative changes.  No acute abnormality. Chest with left ribs: Multiple old left rib fractures no acute fracture. I agree with the radiologist interpretation  Cardiac Monitoring: / EKG:  The patient was maintained on a cardiac monitor.  I personally viewed and interpreted the cardiac monitored which showed an underlying rhythm of: Sinus rhythm   Consultations Obtained:  I requested consultation with  Dr. Jodi Mourning who independently evaluated the patient was agreement with treatment plan going forward.   Problem List / ED Course / Critical interventions / Medication management  Fall, subarachnoid hemorrhage I ordered medication including Tylenol   Reevaluation of the patient after these medicines showed that the patient improved I have reviewed the patients home medicines and have made adjustments as needed   Social Determinants of Health:  Former cigarette use.  Denies illicit drug use.   Test / Admission - Considered:  Fall, subarachnoid hemorrhage Vitals signs significant for hypertension with blood pressure 144/79. Otherwise within normal range and stable throughout visit. Laboratory/imaging studies significant for: see above 67 year old male presents emergency department after mechanical fall occurring greater than 72 hours ago.  Regarding traumatic workup, results overall reassuring.  Patient with evidence of very small subarachnoid hemorrhage stable from 6 to 7 hours prior from the ED visit with CT imaging by primary care.  Patient without any neurologic deficit, headache and not currently anticoagulated.  Further workup regarding subarachnoid hemorrhage deemed unnecessary at this time.  Patient was with evidence of multiple rib fractures on the left side most likely secondary to fall but without evidence of flail chest, pneumothorax, complaints of shortness of  breath.  Will recommend use of incentive spirometry at home as well as OTC medications to help control pain.  Will recommend follow-up with primary care within 2 to 3 days for reassessment of symptoms.  Further workup deemed unnecessary at this time while emergency department.  Treatment plan discussed at length with patient and he acknowledged understanding was agreeable to said plan.  Patient overall well-appearing, afebrile in no acute distress. Worrisome signs and symptoms were discussed with the patient, and the patient acknowledged understanding to return to the ED if noticed. Patient was stable upon discharge.          Final Clinical Impression(s) / ED Diagnoses Final diagnoses:  Fall, initial encounter  Subarachnoid hematoma, without loss of consciousness, subsequent encounter  Closed fracture of multiple ribs of left side, initial encounter    Rx / DC Orders ED Discharge Orders     None         Peter Garter, [place] 01/23/23 1610    Blane Ohara, MD 01/31/23 1505
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
## 2023-01-22 NOTE — Addendum Note (Signed)
Addended by: Angelena Sole on: 01/22/2023 09:46 AM   Modules accepted: Orders

## 2023-01-22 NOTE — ED Notes (Signed)
Reviewed AVS/discharge instruction with patient. Time allotted for and all questions answered. Patient is agreeable for d/c and escorted to ed exit by staff.  

## 2023-01-22 NOTE — Discharge Instructions (Addendum)
As discussed the brain bleed is stable. If you develop worsening headache, signs for stroke, repeat head injury, return to the emergency department; otherwise, recommend follow up with PCP. You also did have evidence of multiple rib fractures. We typically just treat pain for symptoms and the ribs heal on their own. Please do not hesitate to return to the emergency department if the worrisome signs and symptoms we discussed become apparent.
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
## 2023-01-27 ENCOUNTER — Ambulatory Visit: Payer: Medicare HMO | Admitting: Psychology

## 2023-01-27 DIAGNOSIS — F331 Major depressive disorder, recurrent, moderate: Secondary | ICD-10-CM | POA: Diagnosis not present

## 2023-01-27 NOTE — Progress Notes (Signed)
[facility] Counselor Initial Adult Exam  Name: Eugene Guzman. Date: 01/27/2023 MRN: [id number] DOB: 1955-10-20 PCP: [name], MD  Time spent: 60 minutes  Guardian/Payee:  N/A    Paperwork requested: No   Reason for Visit /Presenting Problem: Symptoms of Depression, Grief and Panic   Mental Status Exam: Appearance:   Casual     Behavior:  Appropriate  Motor:  Normal  Speech/Language:   Clear and Coherent  Affect:  Appropriate  Mood:  depressed  Thought process:  normal  Thought content:    WNL  Sensory/Perceptual disturbances:    WNL  Orientation:  oriented to person, place, and situation  Attention:  Good  Concentration:  Good  Memory:  WNL  Fund of knowledge:   Good  Insight:    Good  Judgment:   Good  Impulse Control:  Good    Reported Symptoms:  sadness, powerlessness, anxiety/panic  Risk Assessment: Danger to Self:  No Self-injurious Behavior: No Danger to Others: No Duty to Warn:no Physical Aggression / Violence:No  Access to Firearms a concern: No  Gang Involvement:No  Patient / guardian was educated about steps to take if suicide or homicide risk level increases between visits: n/a While future psychiatric events cannot be accurately predicted, the patient does not currently require acute inpatient psychiatric care and does not currently meet [facility] involuntary commitment criteria.  Substance Abuse History: Current substance abuse: No     Past Psychiatric History:   Previous psychological history is significant for depression and panic Outpatient Providers:Lisa Polis History of Psych Hospitalization: No  Psychological Testing:  N/A    Abuse History:  Victim of: No.,  N/A    Report needed: No. Victim of Neglect:No. Perpetrator of  N/A   Witness / Exposure to Domestic Violence: No   Protective Services Involvement: No  Witness to [employer] Violence:  No   Family History:  Family History  Problem Relation  Age of Onset   Hypertension Mother    Cancer Mother    Hearing loss Mother    Heart attack Father 58   Arthritis Father    Depression Father    Heart disease Father    Depression Paternal Uncle    Depression Paternal Uncle    Ovarian cancer Maternal Grandmother    Cancer Maternal Grandmother    Hearing loss Maternal Grandfather    Heart disease Maternal Grandfather    Hypertension Maternal Grandfather    Stroke Maternal Grandfather    Colon cancer Paternal Grandfather 53   Cancer Paternal Grandfather     Living situation: the patient lives with their spouse  Sexual Orientation: Straight  Relationship Status: married  Name of spouse / other:unknown If a parent, number of children / ages:none  Support Systems: N/A  Surveyor, quantity Stress:  No   Income/Employment/Disability: Social Herbalist:  unknown  Educational History: Education: Risk manager: unknown  Any cultural differences that may affect / interfere with treatment:  not applicable   Recreation/Hobbies: unknown  Stressors: Loss of mother    Strengths: Supportive Relationships and Able to Communicate Effectively  Barriers:  unknown   Legal History: Pending legal issue / charges: The patient has no significant history of legal issues. History of legal issue / charges:  N/A  Medical History/Surgical History: reviewed Past Medical History:  Diagnosis Date   Allergy    childhood - penicillin, 2014 - latex   Anxiety    Aortic aneurysm (HCC)  Arthritis 2014   right shoulder   CLL (chronic lymphocytic leukemia) (HCC)    Coronary artery calcification seen on CT scan    Depressed    Hyperlipidemia    Hypertension    Sleep apnea 2022   Have stopped using CPAP    Past Surgical History:  Procedure Laterality Date   CORONARY PRESSURE/FFR STUDY N/A 04/28/2022   Procedure: INTRAVASCULAR PRESSURE WIRE/FFR STUDY;  Surgeon: [name],  MD;  Location: [facility] INVASIVE CV LAB;  Service: Cardiovascular;  Laterality: N/A;   LEFT HEART CATH AND CORONARY ANGIOGRAPHY N/A 04/28/2022   Procedure: LEFT HEART CATH AND CORONARY ANGIOGRAPHY;  Surgeon: [name], MD;  Location: [facility] INVASIVE CV LAB;  Service: Cardiovascular;  Laterality: N/A;   TONSILLECTOMY  1961    Medications: Current Outpatient Medications  Medication Sig Dispense Refill   amLODipine (NORVASC) 2.5 MG tablet Take 1 tablet (2.5 mg total) by mouth daily. 90 tablet 3   aspirin EC 81 MG tablet Take 1 tablet (81 mg total) by mouth daily. Swallow whole. 90 tablet 3   B Complex Vitamins (B COMPLEX 100 PO) Take 100 mg by mouth daily.     Cholecalciferol (VITAMIN D-3 PO) Take 2,000 Units by mouth daily.     clonazePAM (KLONOPIN) 0.5 MG tablet  (Patient not taking: Reported on 01/20/2023)     ezetimibe (ZETIA) 10 MG tablet Take 1 tablet (10 mg total) by mouth daily. 90 tablet 3   finasteride (PROSCAR) 5 MG tablet Take 5 mg by mouth daily.     fluvoxaMINE (LUVOX) 100 MG tablet Take 100 mg by mouth at bedtime.     lamoTRIgine (LAMICTAL) 200 MG tablet Take 200 mg by mouth daily.     multivitamin (ONE-A-DAY MEN'S) TABS tablet Take 1 tablet by mouth daily.     niacin (TRUE VITAMIN B3) 100 MG tablet      PREVIDENT 5000 BOOSTER PLUS 1.1 % PSTE Place onto teeth.     rosuvastatin (CRESTOR) 20 MG tablet Take 1 tablet (20 mg total) by mouth daily. 90 tablet 3   sildenafil (VIAGRA) 50 MG tablet Take 1 tablet (50 mg total) by mouth daily as needed for erectile dysfunction (Use 1 hour before sexual activity.). 30 tablet 2   tamsulosin (FLOMAX) 0.4 MG CAPS capsule Take 0.4 mg by mouth daily.     WELLBUTRIN XL 150 MG 24 hr tablet 150 mg every other day.     No current facility-administered medications for this visit.    Allergies  Allergen Reactions   Levofloxacin     Avoid fluoroquinolone antibiotics due to thoracic aortic aneurysm   Penicillins Other (See Comments)    Childhood    Latex Rash    Diagnoses:  Major Depression, Panic, Grief  Plan of Care: Outpatient Psychotherapy and medication management  Initial Note: He states he has taken psychotropic medication for years and sees Clayton Lefort at [facility]. States [place] and feels he has suffered depression most of his life. He only took vacation once a year and would get really depressed when it was over. First significant depression was at 33 when he had a break-up. Had more minor depressive episodes earlier in life. Change has always been difficult for him. Also has had severe panic attacks. First one he remembers was first day of his fifth year of college. He was distressed that "this was the end of his college career". He recalls having a panic when on business  trip to [place] in a dark restaurant. He had to come home. 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 He was offered early retirement and had a panic when he realized that he just quit a job of 30 years and his life was changing. When he has panic, he doesn't ave a fear of death, rather it is a fear of something he can't control. His anxiety was less prevalent in his life than the depression.  He says "my major problem in life is depression and panic". His mother passed and he is struggling with her loss. She died 2 weeks ago at 64 and was a vibrant person for 92 years. In 1 year, she had rapid dementia. He says, "she was his best friend". He has a small farm down the road from where his mother lived. He says "I don't handle death well". His reason to seek therapy was before mother was deteriorating. He had tried therapy in the past and it was not helpful. Also, he has had medical challenges himself, but says it does not cause him distress. He has been on a number of different anti-depressants over the years.  He contacted me for his depression, ut says the grief is now most prevalent.  He has 1 sister in [place].  She is 3 years older and they have a good relationship, but she has kids and grandchildren and they keep her busy. He married at 43 and wife was 85. His wife will occasionally tell him she blames him for not having kids. He told her when they married that he was too old to have kids. He says it was his "inability to handle change". This is his only marriage for both he and his wife. Wife is from [place] and has 3 sisters and one brother. None of her family in this area. He left [employer] at 52 ("too early"). 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. Wife retired in 2013.  Will complete intake at next visit. Patient states he wants to continue treatment and prefers face to face when possible.   Goals/Tx. Plan: Patient states that he is seeking symptom relief from life long depressive episodes. First, however, he needs support and strategies to manage grief over the recent (2 weeks) loss of his mother. Will engage in grief counseling to reduce current despair. In addition, treatment will address his anxiety that contributes to episodic panic attacks. This will involve relaxation techniques and other behavioral strategies  to facilitate greater control over his anxiety. Patient agrees to plan with a goal date of 12-24.         Garrel Ridgel, PhD

## 2023-02-01 DIAGNOSIS — F331 Major depressive disorder, recurrent, moderate: Secondary | ICD-10-CM | POA: Diagnosis not present

## 2023-02-01 DIAGNOSIS — F410 Panic disorder [episodic paroxysmal anxiety] without agoraphobia: Secondary | ICD-10-CM | POA: Diagnosis not present

## 2023-02-01 DIAGNOSIS — F4323 Adjustment disorder with mixed anxiety and depressed mood: Secondary | ICD-10-CM | POA: Diagnosis not present

## 2023-02-04 ENCOUNTER — Ambulatory Visit (INDEPENDENT_AMBULATORY_CARE_PROVIDER_SITE_OTHER): Payer: Medicare HMO | Admitting: Psychology

## 2023-02-04 DIAGNOSIS — F331 Major depressive disorder, recurrent, moderate: Secondary | ICD-10-CM | POA: Diagnosis not present

## 2023-02-04 NOTE — Progress Notes (Signed)
[facility] Counselor Initial Adult Exam  Name: Eugene Guzman. Date: 02/04/2023 MRN: [id number] DOB: 09/21/1955 PCP: [name], MD  Time spent: 60 minutes  Guardian/Payee:  N/A    Paperwork requested: No   Reason for Visit /Presenting Problem: Symptoms of Depression, Grief and Panic   Mental Status Exam: Appearance:   Casual     Behavior:  Appropriate  Motor:  Normal  Speech/Language:   Clear and Coherent  Affect:  Appropriate  Mood:  depressed  Thought process:  normal  Thought content:    WNL  Sensory/Perceptual disturbances:    WNL  Orientation:  oriented to person, place, and situation  Attention:  Good  Concentration:  Good  Memory:  WNL  Fund of knowledge:   Good  Insight:    Good  Judgment:   Good  Impulse Control:  Good    Reported Symptoms:  sadness, powerlessness, anxiety/panic  Risk Assessment: Danger to Self:  No Self-injurious Behavior: No Danger to Others: No Duty to Warn:no Physical Aggression / Violence:No  Access to Firearms a concern: No  Gang Involvement:No  Patient / guardian was educated about steps to take if suicide or homicide risk level increases between visits: n/a While future psychiatric events cannot be accurately predicted, the patient does not currently require acute inpatient psychiatric care and does not currently meet [facility] involuntary commitment criteria.  Substance Abuse History: Current substance abuse: No     Past Psychiatric History:   Previous psychological history is significant for depression and panic Outpatient Providers:Lisa Polis History of Psych Hospitalization: No  Psychological Testing:  N/A    Abuse History:  Victim of: No.,  N/A    Report needed: No. Victim of Neglect:No. Perpetrator of  N/A   Witness / Exposure to Domestic Violence: No   Protective Services Involvement: No  Witness to [employer] Violence:  No   Family History:  Family History  Problem Relation  Age of Onset   Hypertension Mother    Cancer Mother    Hearing loss Mother    Heart attack Father 98   Arthritis Father    Depression Father    Heart disease Father    Depression Paternal Uncle    Depression Paternal Uncle    Ovarian cancer Maternal Grandmother    Cancer Maternal Grandmother    Hearing loss Maternal Grandfather    Heart disease Maternal Grandfather    Hypertension Maternal Grandfather    Stroke Maternal Grandfather    Colon cancer Paternal Grandfather 52   Cancer Paternal Grandfather     Living situation: the patient lives with their spouse  Sexual Orientation: Straight  Relationship Status: married  Name of spouse / other:unknown If a parent, number of children / ages:none  Support Systems: N/A  Surveyor, quantity Stress:  No   Income/Employment/Disability: Social Herbalist:  unknown  Educational History: Education: Risk manager: unknown  Any cultural differences that may affect / interfere with treatment:  not applicable   Recreation/Hobbies: unknown  Stressors: Loss of mother    Strengths: Supportive Relationships and Able to Communicate Effectively  Barriers:  unknown   Legal History: Pending legal issue / charges: The patient has no significant history of legal issues. History of legal issue / charges:  N/A  Medical History/Surgical History: reviewed Past Medical History:  Diagnosis Date   Allergy    childhood - penicillin, 2014 - latex   Anxiety    Aortic aneurysm (HCC)  Arthritis 2014   right shoulder   CLL (chronic lymphocytic leukemia) (HCC)    Coronary artery calcification seen on CT scan    Depressed    Hyperlipidemia    Hypertension    Sleep apnea 2022   Have stopped using CPAP    Past Surgical History:  Procedure Laterality Date   CORONARY PRESSURE/FFR STUDY N/A 04/28/2022   Procedure: INTRAVASCULAR PRESSURE WIRE/FFR STUDY;  Surgeon: [name],  MD;  Location: [facility] INVASIVE CV LAB;  Service: Cardiovascular;  Laterality: N/A;   LEFT HEART CATH AND CORONARY ANGIOGRAPHY N/A 04/28/2022   Procedure: LEFT HEART CATH AND CORONARY ANGIOGRAPHY;  Surgeon: [name], MD;  Location: [facility] INVASIVE CV LAB;  Service: Cardiovascular;  Laterality: N/A;   TONSILLECTOMY  1961    Medications: Current Outpatient Medications  Medication Sig Dispense Refill   amLODipine (NORVASC) 2.5 MG tablet Take 1 tablet (2.5 mg total) by mouth daily. 90 tablet 3   aspirin EC 81 MG tablet Take 1 tablet (81 mg total) by mouth daily. Swallow whole. 90 tablet 3   B Complex Vitamins (B COMPLEX 100 PO) Take 100 mg by mouth daily.     Cholecalciferol (VITAMIN D-3 PO) Take 2,000 Units by mouth daily.     clonazePAM (KLONOPIN) 0.5 MG tablet  (Patient not taking: Reported on 01/20/2023)     ezetimibe (ZETIA) 10 MG tablet Take 1 tablet (10 mg total) by mouth daily. 90 tablet 3   finasteride (PROSCAR) 5 MG tablet Take 5 mg by mouth daily.     fluvoxaMINE (LUVOX) 100 MG tablet Take 100 mg by mouth at bedtime.     lamoTRIgine (LAMICTAL) 200 MG tablet Take 200 mg by mouth daily.     multivitamin (ONE-A-DAY MEN'S) TABS tablet Take 1 tablet by mouth daily.     niacin (TRUE VITAMIN B3) 100 MG tablet      PREVIDENT 5000 BOOSTER PLUS 1.1 % PSTE Place onto teeth.     rosuvastatin (CRESTOR) 20 MG tablet Take 1 tablet (20 mg total) by mouth daily. 90 tablet 3   sildenafil (VIAGRA) 50 MG tablet Take 1 tablet (50 mg total) by mouth daily as needed for erectile dysfunction (Use 1 hour before sexual activity.). 30 tablet 2   tamsulosin (FLOMAX) 0.4 MG CAPS capsule Take 0.4 mg by mouth daily.     WELLBUTRIN XL 150 MG 24 hr tablet 150 mg every other day.     No current facility-administered medications for this visit.    Allergies  Allergen Reactions   Levofloxacin     Avoid fluoroquinolone antibiotics due to thoracic aortic aneurysm   Penicillins Other (See Comments)    Childhood    Latex Rash    Diagnoses:  Major Depression, Panic, Grief  Plan of Care: Outpatient Psychotherapy and medication management  Initial Note: He states he has taken psychotropic medication for years and sees Clayton Lefort at [facility]. States he [place] and feels he has suffered depression most of his life. He only took vacation once a year and would get really depressed when it was over. First significant depression was at 33 when he had a break-up. Had more minor depressive episodes earlier in life. Change has always been difficult for him. Also has had severe panic attacks. First one he remembers was first day of his fifth year of college. He was distressed that "this was the end of his college career". He recalls having a panic when on business  trip to [place] in a dark restaurant. He had to come home. 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 He was offered early retirement and had a panic when he realized that he just quit a job of 30 years and his life was changing. When he has panic, he doesn't have a fear of death, rather it is a fear of something he can't control. His anxiety was less prevalent in his life than the depression.  He says "my major problem in life is depression and panic". His mother passed and he is struggling with her loss. She died 2 weeks ago at 57 and was a vibrant person for 92 years. In 1 year, she had rapid dementia. He says, "she was his best friend". He has a small farm down the road from where his mother lived. He says "I don't handle death well". His reason to seek therapy was before mother was deteriorating. He had tried therapy in the past and it was not helpful. Also, he has had medical challenges himself, but says it does not cause him distress. He has been on a number of different anti-depressants over the years.  He contacted me for his depression, ut says the grief is now most prevalent.  He has 1 sister in [place].  She is 3 years older and they have a good relationship, but she has kids and grandchildren and they keep her busy. He married at 74 and wife was 73. His wife will occasionally tell him she blames him for not having kids. He told her when they married that he was too old to have kids. He says it was his "inability to handle change". This is his only marriage for both he and his wife. Wife is from [place] and has 3 sisters and one brother. None of her family in this area. He left [employer] at 87 ("too early"). 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. Wife retired in 2013.  Will complete intake at next visit. Patient states he wants to continue treatment and prefers face to face when possible.   Goals/Tx. Plan: Patient states that he is seeking symptom relief from life long depressive episodes. First, however, he needs support and strategies to manage grief over the recent (2 weeks) loss of his mother. Will engage in grief counseling to reduce current despair. In addition, treatment will address his anxiety that contributes to episodic panic attacks. This will involve relaxation techniques and other behavioral strategies  to facilitate greater control over his anxiety. Patient agrees to plan with a goal date of 12-24.   Session note: States that he has been very sad about his mother and cannot get the image of his mother in frail state out of his mind. He focuses on the last three months of her life when she could not feed herself. His wife is out of town for 3 days and he is alone for the first time. His wife arranged for one of his friends to join him at [employer]. We talked about what to do in middle of the night when he is upset and anxious. Suggested that he consider downloading and listening to apps. He tends to sleep downstairs in his chair. He mostly makes his way to the bedroom at some point during the night, but doesn't sleep well. He rarely ever starts out in the marital  bedroom. He does not get any "deep sleep". Fatigue is  one of the symptoms of his Leukemia. He feels that his mom knew what was happening to her just before her death.  He feels lonely and thinks "this would all be easier if we had kids".  He feels he is not strong and it difficult to conceptualize ever selling parent's house.  We talked about the best way to manage his distress at this time.

## 2023-02-18 ENCOUNTER — Encounter (INDEPENDENT_AMBULATORY_CARE_PROVIDER_SITE_OTHER): Payer: Self-pay

## 2023-02-24 DIAGNOSIS — D649 Anemia, unspecified: Secondary | ICD-10-CM | POA: Diagnosis not present

## 2023-02-24 DIAGNOSIS — I10 Essential (primary) hypertension: Secondary | ICD-10-CM | POA: Diagnosis not present

## 2023-02-24 DIAGNOSIS — C9110 Chronic lymphocytic leukemia of B-cell type not having achieved remission: Secondary | ICD-10-CM | POA: Diagnosis not present

## 2023-02-24 DIAGNOSIS — I2510 Atherosclerotic heart disease of native coronary artery without angina pectoris: Secondary | ICD-10-CM | POA: Diagnosis not present

## 2023-02-24 DIAGNOSIS — R5383 Other fatigue: Secondary | ICD-10-CM | POA: Diagnosis not present

## 2023-02-24 DIAGNOSIS — D72820 Lymphocytosis (symptomatic): Secondary | ICD-10-CM | POA: Diagnosis not present

## 2023-02-24 DIAGNOSIS — D630 Anemia in neoplastic disease: Secondary | ICD-10-CM | POA: Diagnosis not present

## 2023-02-24 DIAGNOSIS — Z87891 Personal history of nicotine dependence: Secondary | ICD-10-CM | POA: Diagnosis not present

## 2023-02-25 LAB — PSA: PSA: 1.81

## 2023-03-02 ENCOUNTER — Telehealth: Payer: Self-pay | Admitting: Hematology

## 2023-03-05 DIAGNOSIS — C9110 Chronic lymphocytic leukemia of B-cell type not having achieved remission: Secondary | ICD-10-CM | POA: Diagnosis not present

## 2023-03-10 ENCOUNTER — Other Ambulatory Visit: Payer: Self-pay | Admitting: Internal Medicine

## 2023-03-10 ENCOUNTER — Ambulatory Visit (INDEPENDENT_AMBULATORY_CARE_PROVIDER_SITE_OTHER): Payer: Medicare HMO | Admitting: Psychology

## 2023-03-10 DIAGNOSIS — F331 Major depressive disorder, recurrent, moderate: Secondary | ICD-10-CM | POA: Diagnosis not present

## 2023-03-10 NOTE — Progress Notes (Signed)
[facility] Counselor Initial Adult Exam  Name: Eugene Guzman. Date: 03/10/2023 MRN: [id number] DOB: 11/30/1955 PCP: [name], MD  Time spent: 60 minutes  Guardian/Payee:  N/A    Paperwork requested: No   Reason for Visit /Presenting Problem: Symptoms of Depression, Grief and Panic   Mental Status Exam: Appearance:   Casual     Behavior:  Appropriate  Motor:  Normal  Speech/Language:   Clear and Coherent  Affect:  Appropriate  Mood:  depressed  Thought process:  normal  Thought content:    WNL  Sensory/Perceptual disturbances:    WNL  Orientation:  oriented to person, place, and situation  Attention:  Good  Concentration:  Good  Memory:  WNL  Fund of knowledge:   Good  Insight:    Good  Judgment:   Good  Impulse Control:  Good    Reported Symptoms:  sadness, powerlessness, anxiety/panic  Risk Assessment: Danger to Self:  No Self-injurious Behavior: No Danger to Others: No Duty to Warn:no Physical Aggression / Violence:No  Access to Firearms a concern: No  Gang Involvement:No  Patient / guardian was educated about steps to take if suicide or homicide risk level increases between visits: n/a While future psychiatric events cannot be accurately predicted, the patient does not currently require acute inpatient psychiatric care and does not currently meet [facility] involuntary commitment criteria.  Substance Abuse History: Current substance abuse: No     Past Psychiatric History:   Previous psychological history is significant for depression and panic Outpatient Providers:Lisa Polis History of Psych Hospitalization: No  Psychological Testing:  N/A    Abuse History:  Victim of: No.,  N/A    Report needed: No. Victim of Neglect:No. Perpetrator of  N/A   Witness / Exposure to Domestic Violence: No   Protective Services Involvement: No  Witness to [employer] Violence:  No   Family History:   Family History  Problem Relation Age of Onset   Hypertension Mother    Cancer Mother    Hearing loss Mother    Heart attack Father 65   Arthritis Father    Depression Father    Heart disease Father    Depression Paternal Uncle    Depression Paternal Uncle    Ovarian cancer Maternal Grandmother    Cancer Maternal Grandmother    Hearing loss Maternal Grandfather    Heart disease Maternal Grandfather    Hypertension Maternal Grandfather    Stroke Maternal Grandfather    Colon cancer Paternal Grandfather 25   Cancer Paternal Grandfather     Living situation: the patient lives with their spouse  Sexual Orientation: Straight  Relationship Status: married  Name of spouse / other:unknown If a parent, number of children / ages:none  Support Systems: N/A  Surveyor, quantity Stress:  No   Income/Employment/Disability: Social Herbalist:  unknown  Educational History: Education: Risk manager: unknown  Any cultural differences that may affect / interfere with treatment:  not applicable   Recreation/Hobbies: unknown  Stressors: Loss of mother    Strengths: Supportive Relationships and Able to Communicate Effectively  Barriers:  unknown   Legal History: Pending legal issue / charges: The patient has no significant history of legal issues. History of legal issue / charges:  N/A  Medical History/Surgical History: reviewed Past Medical History:  Diagnosis Date   Allergy  childhood - penicillin, 2014 - latex   Anxiety    Aortic aneurysm (HCC)    Arthritis 2014   right shoulder   CLL (chronic lymphocytic leukemia) (HCC)    Coronary artery calcification seen on CT scan    Depressed    Hyperlipidemia    Hypertension    Sleep apnea 2022   Have stopped using CPAP    Past Surgical History:  Procedure Laterality Date   CORONARY PRESSURE/FFR STUDY N/A 04/28/2022   Procedure: INTRAVASCULAR PRESSURE WIRE/FFR  STUDY;  Surgeon: [name], MD;  Location: [facility] INVASIVE CV LAB;  Service: Cardiovascular;  Laterality: N/A;   LEFT HEART CATH AND CORONARY ANGIOGRAPHY N/A 04/28/2022   Procedure: LEFT HEART CATH AND CORONARY ANGIOGRAPHY;  Surgeon: [name], MD;  Location[facility] INVASIVE CV LAB;  Service: Cardiovascular;  Laterality: N/A;   TONSILLECTOMY  1961    Medications: Current Outpatient Medications  Medication Sig Dispense Refill   amLODipine (NORVASC) 2.5 MG tablet Take 1 tablet (2.5 mg total) by mouth daily. (Patient taking differently: Take 2.5 mg by mouth at bedtime.) 90 tablet 3   aspirin EC 81 MG tablet Take 1 tablet (81 mg total) by mouth daily. Swallow whole. (Patient taking differently: Take 81 mg by mouth at bedtime. Swallow whole.) 90 tablet 3   b complex vitamins capsule Take 1 capsule by mouth daily.     bismuth subsalicylate (PEPTO BISMOL) 262 MG/15ML suspension Take 30 mLs by mouth every 6 (six) hours as needed for indigestion or diarrhea or loose stools.     Cholecalciferol (VITAMIN D) 50 MCG (2000 UT) tablet Take 2,000 Units by mouth daily.     clonazePAM (KLONOPIN) 0.5 MG tablet Take 0.5 mg by mouth 4 (four) times daily as needed for anxiety.     ezetimibe (ZETIA) 10 MG tablet Take 1 tablet (10 mg total) by mouth daily. (Patient taking differently: Take 10 mg by mouth at bedtime.) 90 tablet 3   finasteride (PROSCAR) 5 MG tablet Take 5 mg by mouth at bedtime.     fluvoxaMINE (LUVOX) 100 MG tablet Take 200 mg by mouth at bedtime.     lamoTRIgine (LAMICTAL) 200 MG tablet Take 200 mg by mouth at bedtime.     multivitamin (ONE-A-DAY MEN'S) TABS tablet Take 1 tablet by mouth at bedtime.     PREVIDENT 5000 BOOSTER PLUS 1.1 % PSTE Place 1 Application onto teeth daily.     rosuvastatin (CRESTOR) 20 MG tablet Take 1 tablet (20 mg total) by mouth daily. (Patient taking differently: Take 20 mg by mouth at bedtime.) 90 tablet 3   sildenafil (VIAGRA) 50 MG tablet Take 1 tablet (50 mg  total) by mouth daily as needed for erectile dysfunction (Use 1 hour before sexual activity.). 30 tablet 2   tamsulosin (FLOMAX) 0.4 MG CAPS capsule Take 0.4 mg by mouth at bedtime.     WELLBUTRIN XL 150 MG 24 hr tablet Take 150 mg by mouth every other day.     No current facility-administered medications for this visit.    Allergies  Allergen Reactions   Levofloxacin     Avoid fluoroquinolone antibiotics due to thoracic aortic aneurysm   Penicillins Other (See Comments)    Childhood   Latex Rash    Diagnoses:  Major Depression, Panic, Grief  Plan of Care: Outpatient Psychotherapy and medication management  Initial Note: He states he has taken psychotropic medication for years and sees Clayton Lefort at [facility]. States [place] and feels he has suffered depression most of his life. He only took vacation once a year and would get really depressed when it was over. First significant depression was at 33 when he had a break-up. Had more minor depressive episodes earlier in life. Change has always been difficult for him. Also has had severe panic attacks. First one he remembers was first day of his fifth year of college. He was distressed that "this was the end of his college career". He recalls having a panic when on business trip to [place] in a [employer]. He had to come home. 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 He was offered early retirement and had a panic when he realized that he just quit a job of 30 years and his life was changing. When he has panic, he doesn't have a fear of death, rather it is a fear of something he can't control. His anxiety was less prevalent in his life than the depression.  He says "my major problem in life is depression and panic". His mother passed and he is struggling with her loss. She died 2 weeks ago at 2 and was a vibrant person for 92 years. In 1 year, she had rapid dementia. He says, "she was  his best friend". He has a small farm down the road from where his mother lived. He says "I don't handle death well". His reason to seek therapy was before mother was deteriorating. He had tried therapy in the past and it was not helpful. Also, he has had medical challenges himself, but says it does not cause him distress. He has been on a number of different anti-depressants over the years.  He contacted me for his depression, ut says the grief is now most prevalent.  He has 1 sister in [place]. She is 3 years older and they have a good relationship, but she has kids and grandchildren and they keep her busy. He married at 70 and wife was 41. His wife will occasionally tell him she blames him for not having kids. He told her when they married that he was too old to have kids. He says it was his "inability to handle change". This is his only marriage for both he and his wife. Wife is from [place] and has 3 sisters and one brother. None of her family in this area. He left [employer] at 36 ("too early"). 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. Wife retired in 2013.  Will complete intake at next visit. Patient states he wants to continue treatment and prefers face to face when possible.   Goals/Tx. Plan: Patient states that he is seeking symptom relief from life long depressive episodes. First, however, he needs support and strategies to manage grief over the recent (2 weeks) loss of his mother. Will engage in grief counseling to reduce current despair. In addition, treatment will address his anxiety that contributes to episodic panic attacks. This will involve relaxation techniques and other behavioral strategies  to facilitate greater control over his anxiety. Patient agrees to plan with a goal date of 12-24.   Patient was seen face to face in Provider's office.  Session note: States that the past month has been terrible. His wife wants to move and his mother's house (of 70 years!) has to be  cleaned out in a year and he says the task is daunting. He is feeling a  great deal of pressure from wife to move to a house in [place] (from a townhouse). He has lots of land in the country. Was going to build a house, but it was too expensive. Now he has the money, but she doesn't want to build in the country. He has no interest in living in the suburbs. He sees no resolution to this other than getting a divorce. He feels that change is intolerable. He says "I never changed in my youth" and it is hard to imagine for him.    His wife is telling him it is "her time" and that he owes her. She also brings up that he deprived her of children and that she has "sacrificed enough". He feels that wife is ignoring that his mother has passed and there is a lot to do. Talked to him about approaching wife with "compromise" in mind. He feels she will not compromise.

## 2023-03-10 NOTE — Patient Instructions (Signed)
SURGICAL WAITING ROOM VISITATION  Patients having surgery or a procedure may have no more than 2 support people in the waiting area - these visitors may rotate.    Children under the age of 66 must have an adult with them who is not the patient.  Due to an increase in RSV and influenza rates and associated hospitalizations, children ages 86 and under may not visit patients in [facility] hospitals.  If the patient needs to stay at the hospital during part of their recovery, the visitor guidelines for inpatient rooms apply. Pre-op nurse will coordinate an appropriate time for 1 support person to accompany patient in pre-op.  This support person may not rotate.    Please refer to the [facility] website for the visitor guidelines for Inpatients (after your surgery is over and you are in a regular room).       Your procedure is scheduled on: 03/19/23   Report to [facility] Main Entrance    Report to admitting at 12:15 PM   Call this number if you have problems the morning of surgery [phone]   Do not eat food or drink liquids :After Midnight.      Oral Hygiene is also important to reduce your risk of infection.                                    Remember - BRUSH YOUR TEETH THE MORNING OF SURGERY WITH YOUR REGULAR TOOTHPASTE  DENTURES WILL BE REMOVED PRIOR TO SURGERY PLEASE DO NOT APPLY "Poly grip" OR ADHESIVES!!!   Stop all vitamins and herbal supplements 7 days before surgery.   Take these medicines the morning of surgery with A SIP OF WATER: Amlodipine, Clonazepam, Wellbutrin  Bring CPAP mask and tubing day of surgery.                              You may not have any metal on your body including hair pins, jewelry, and body piercing             Do not wear make-up, lotions, powders, perfumes/cologne, or deodorant              Men may shave face and neck.   Do not bring valuables to the hospital. [facility] IS NOT             RESPONSIBLE   FOR  VALUABLES.   Contacts, glasses, dentures or bridgework may not be worn into surgery.   Bring small overnight bag day of surgery.   DO NOT BRING YOUR HOME MEDICATIONS TO THE HOSPITAL. PHARMACY WILL DISPENSE MEDICATIONS LISTED ON YOUR MEDICATION LIST TO YOU DURING YOUR ADMISSION IN THE HOSPITAL!    Patients discharged on the day of surgery will not be allowed to drive home.  Someone NEEDS to stay with you for the first 24 hours after anesthesia.   Special Instructions: Bring a copy of your healthcare power of attorney and living will documents the day of surgery if you haven't scanned them before.              Please read over the following fact sheets you were given: IF YOU HAVE QUESTIONS ABOUT YOUR PRE-OP INSTRUCTIONS PLEASE CALL [phone] [facility]   If you received a COVID test during your pre-op visit  it is requested that you wear a mask when  out in public, stay away from anyone that may not be feeling well and notify your surgeon if you develop symptoms. If you test positive for Covid or have been in contact with anyone that has tested positive in the last 10 days please notify you surgeon.    [facility] - Preparing for Surgery Before surgery, you can play an important role.  Because skin is not sterile, your skin needs to be as free of germs as possible.  You can reduce the number of germs on your skin by washing with CHG (chlorahexidine gluconate) soap before surgery.  CHG is an antiseptic cleaner which kills germs and bonds with the skin to continue killing germs even after washing. Please DO NOT use if you have an allergy to CHG or antibacterial soaps.  If your skin becomes reddened/irritated stop using the CHG and inform your nurse when you arrive at Short Stay. Do not shave (including legs and underarms) for at least 48 hours prior to the first CHG shower.  You may shave your face/neck.  Please follow these instructions carefully:  1.  Shower with CHG Soap the night before surgery  and the  morning of surgery.  2.  If you choose to wash your hair, wash your hair first as usual with your normal  shampoo.  3.  After you shampoo, rinse your hair and body thoroughly to remove the shampoo.                             4.  Use CHG as you would any other liquid soap.  You can apply chg directly to the skin and wash.  Gently with a scrungie or clean washcloth.  5.  Apply the CHG Soap to your body ONLY FROM THE NECK DOWN.   Do   not use on face/ open                           Wound or open sores. Avoid contact with eyes, ears mouth and   genitals (private parts).                       Wash face,  Genitals (private parts) with your normal soap.             6.  Wash thoroughly, paying special attention to the area where your    surgery  will be performed.  7.  Thoroughly rinse your body with warm water from the neck down.  8.  DO NOT shower/wash with your normal soap after using and rinsing off the CHG Soap.                9.  Pat yourself dry with a clean towel.            10.  Wear clean pajamas.            11.  Place clean sheets on your bed the night of your first shower and do not  sleep with pets. Day of Surgery : Do not apply any lotions/deodorants the morning of surgery.  Please wear clean clothes to the hospital/surgery center.  FAILURE TO FOLLOW THESE INSTRUCTIONS MAY RESULT IN THE CANCELLATION OF YOUR SURGERY  PATIENT SIGNATURE_________________________________  NURSE SIGNATURE__________________________________  ________________________________________________________________________

## 2023-03-10 NOTE — Progress Notes (Signed)
COVID Vaccine received:  []  No [x]  Yes Date of any COVID positive Test in last 90 days: No  PCP -  Aris Georgia MD Cardiologist - Dietrich Pates MD  Chest x-ray - 12/23/22 Epic EKG - 04/29/22 Epic  Stress Test - 12/13/19 Epic ECHO - 07/16/21 Epic Cardiac Cath - 04/27/22 Epic  Bowel Prep - [x]  No  []   Yes ______  Pacemaker / ICD device [x]  No []  Yes   Spinal Cord Stimulator:[x]  No []  Yes       History of Sleep Apnea? []  No [x]  Yes   CPAP used?- [x]  No []  Yes    Does the patient monitor blood sugar?          [x]  No []  Yes  []  N/A  Patient has: [x]  NO Hx DM   []  Pre-DM                 []  DM1  []   DM2 Does patient have a [employer] or [employer]om? []  No []  Yes   Fasting Blood Sugar Ranges-  Checks Blood Sugar _____ times a day  GLP1 agonist / usual dose - no GLP1 instructions:  SGLT-2 inhibitors / usual dose - no SGLT-2 instructions:   Blood Thinner / Instructions: Aspirin Instructions:Asa 81mg  day. Will stop today.  Comments:   Activity level: Patient is able to climb a flight of stairs without difficulty; [x]  No CP  [x]  No SOB Patient can perform ADLs without assistance.   Anesthesia review: HTN, OSA,CLL,CAD,Ascend. Aortic aneurysm.  Patient denies shortness of breath, fever, cough and chest pain at PAT appointment.  Patient verbalized understanding and agreement to the Pre-Surgical Instructions that were given to them at this PAT appointment. Patient was also educated of the need to review these PAT instructions again prior to his/her surgery.I reviewed the appropriate phone numbers to call if they have any and questions or concerns.

## 2023-03-11 ENCOUNTER — Ambulatory Visit (INDEPENDENT_AMBULATORY_CARE_PROVIDER_SITE_OTHER): Payer: Medicare HMO | Admitting: Family Medicine

## 2023-03-11 ENCOUNTER — Encounter (HOSPITAL_COMMUNITY): Payer: Self-pay

## 2023-03-11 ENCOUNTER — Encounter (HOSPITAL_COMMUNITY)
Admission: RE | Admit: 2023-03-11 | Discharge: 2023-03-11 | Disposition: A | Discharge status: Home / Self Care | Payer: Medicare HMO | Source: Ambulatory Visit | Attending: Urology | Admitting: Urology

## 2023-03-11 ENCOUNTER — Other Ambulatory Visit: Payer: Self-pay

## 2023-03-11 ENCOUNTER — Encounter: Payer: Self-pay | Admitting: Family Medicine

## 2023-03-11 VITALS — BP 138/76 | HR 65 | Temp 98.0°F | Resp 18 | Ht 70.0 in | Wt 167.4 lb

## 2023-03-11 DIAGNOSIS — F419 Anxiety disorder, unspecified: Secondary | ICD-10-CM | POA: Insufficient documentation

## 2023-03-11 DIAGNOSIS — I2510 Atherosclerotic heart disease of native coronary artery without angina pectoris: Secondary | ICD-10-CM | POA: Insufficient documentation

## 2023-03-11 DIAGNOSIS — Z87891 Personal history of nicotine dependence: Secondary | ICD-10-CM | POA: Diagnosis not present

## 2023-03-11 DIAGNOSIS — R251 Tremor, unspecified: Secondary | ICD-10-CM | POA: Diagnosis not present

## 2023-03-11 DIAGNOSIS — F329 Major depressive disorder, single episode, unspecified: Secondary | ICD-10-CM | POA: Insufficient documentation

## 2023-03-11 DIAGNOSIS — Q231 Congenital insufficiency of aortic valve: Secondary | ICD-10-CM | POA: Diagnosis not present

## 2023-03-11 DIAGNOSIS — C9110 Chronic lymphocytic leukemia of B-cell type not having achieved remission: Secondary | ICD-10-CM | POA: Insufficient documentation

## 2023-03-11 DIAGNOSIS — R69 Illness, unspecified: Secondary | ICD-10-CM

## 2023-03-11 DIAGNOSIS — I7121 Aneurysm of the ascending aorta, without rupture: Secondary | ICD-10-CM | POA: Diagnosis not present

## 2023-03-11 DIAGNOSIS — Z01818 Encounter for other preprocedural examination: Secondary | ICD-10-CM | POA: Diagnosis not present

## 2023-03-11 DIAGNOSIS — I10 Essential (primary) hypertension: Secondary | ICD-10-CM | POA: Diagnosis not present

## 2023-03-11 DIAGNOSIS — Z0000 Encounter for general adult medical examination without abnormal findings: Secondary | ICD-10-CM

## 2023-03-11 DIAGNOSIS — I700 Atherosclerosis of aorta: Secondary | ICD-10-CM | POA: Diagnosis not present

## 2023-03-11 NOTE — Patient Instructions (Addendum)
It was very nice to see you today!  Work on balance and exercise-minimum 5 minutes a day.   JUST DO IT.   Shut up and do it!!!!!!!!!!!!!  See Dr. Vickey Huger for tremors  Weighted utensils for tremor.  Hep B series of shots. Get Tdap done at pharmacy   PLEASE NOTE:  If you had any lab tests please let us know if you have not heard back within a few days. You may see your results on MyChart before we have a chance to review them but we will give you a call once they are reviewed by [place]. If we ordered any referrals today, please let us know if you have not heard from their office within the next week.   Please try these tips to maintain a healthy lifestyle:  Eat most of your calories during the day when you are active. Eliminate processed foods including packaged sweets (pies, cakes, cookies), reduce intake of potatoes, white bread, white pasta, and white rice. Look for whole grain options, oat flour or almond flour.  Each meal should contain half fruits/vegetables, one quarter protein, and one quarter carbs (no bigger than a computer mouse).  Cut down on sweet beverages. This includes juice, soda, and sweet tea. Also watch fruit intake, though this is a healthier sweet option, it still contains natural sugar! Limit to 3 servings daily.  Drink at least 1 glass of water with each meal and aim for at least 8 glasses per day  Exercise at least 150 minutes every week.

## 2023-03-11 NOTE — Progress Notes (Signed)
Phone: [phone]   Subjective:  Patient 67 y.o. male presenting for annual physical.  Chief Complaint  Patient presents with   Annual Exam    CPE Fasting Have some fatigue and dizziness, would like to discuss meds with pharmacy   Annual - Not exercising regularly. Has been cutting back on drinking, recently went to the beach with friends and drank some alcohol.   Anxiety/Depression - Still struggling with grief since his mom passed on January 18, 2023. Is harder on holidays, plans to maybe go out of town on upcoming holidays. Is taking Clonazepam 0.5 mg as needed for anxiety, has started taking at nighttime to help him sleep. No SI.   Dizziness/Fall - He continues to experience intermittent dizziness. He reports he recently fell/bumped into a table when going to use the bathroom in the middle of the night. He states the hallway was dark, making it harder.    Nocturia - Has to wake up multiple times throughout the night to use the bathroom.  Is on tamsulosin 0.4 mg daily and proscar 5 mg. Follows up with urology and is scheduled for TURP on 9/13.   Fatigue - He complains of fatigue. He believes the fatigue may be worsened by his depression/anxiety.   Hand tremors - He complains of intermittent bilateral hand tremors. Has to only fill glasses half full, if he fills the glass of water up he will spill most of it due to the tremor. States his hands tremors even at rest. Reports his grandfather had a hx of hand tremors.   He has followed up with oncology and is awaiting test results for possible diagnosis of CLL. States he may get results back in 2-3 weeks.   Sleep apnea - Has not been using his CPAP d/t claustrophobia   See problem oriented charting- ROS- ROS: Gen: no fever, chills  Skin: no rash, itching ENT: no ear pain, ear drainage, nasal congestion, rhinorrhea, sinus pressure, sore throat Eyes: no blurry vision, double vision Resp: no cough, wheeze,SOB CV: no CP, palpitations, LE edema,   GI: no heartburn, n/v/d/c, abd pain GU: HPI MSK: no joint pain, myalgias, back pain Neuro: no headache, weakness Psych:chronic.  The following were reviewed and entered/updated in epic: Past Medical History:  Diagnosis Date   Allergy    childhood - penicillin, 2014 - latex   Anxiety    Aortic aneurysm (HCC)    Arthritis 2014   right shoulder   CLL (chronic lymphocytic leukemia) (HCC)    Coronary artery calcification seen on CT scan    Depressed    Hyperlipidemia    Hypertension    Sleep apnea 2022   Have stopped using CPAP   Patient Active Problem List   Diagnosis Date Noted   Abnormal nuclear stress test    Atypical angina    Panic disorder without agoraphobia 03/02/2022   Depression 03/02/2022   Fatigue 03/02/2022   Severe sleep apnea 02/19/2022   Severe obstructive sleep apnea-hypopnea syndrome 09/10/2021   Chronic intermittent hypoxia with obstructive sleep apnea 09/10/2021   Sleep apnea, obstructive 07/22/2021   Snoring 07/22/2021   Vitamin D deficiency 05/07/2021   Hypertension 01/31/2021   BPH (benign prostatic hyperplasia) 01/31/2021   Depression, recurrent (HCC) 01/31/2021   Hyperlipidemia 01/31/2021   CAD (coronary artery disease) 01/31/2021   Ascending aortic aneurysm (HCC) 01/31/2021   Upper airway cough syndrome 07/19/2015   Past Surgical History:  Procedure Laterality Date   CORONARY PRESSURE/FFR STUDY N/A 04/28/2022   Procedure: INTRAVASCULAR PRESSURE WIRE/FFR  STUDY;  Surgeon: [name], MD;  Location: [facility] INVASIVE CV LAB;  Service: Cardiovascular;  Laterality: N/A;   LEFT HEART CATH AND CORONARY ANGIOGRAPHY N/A 04/28/2022   Procedure: LEFT HEART CATH AND CORONARY ANGIOGRAPHY;  Surgeon: [name], MD;  Location: [facility] INVASIVE CV LAB;  Service: Cardiovascular;  Laterality: N/A;   TONSILLECTOMY  1961    Family History  Problem Relation Age of Onset   Hypertension Mother    Cancer Mother    Hearing loss Mother    Heart attack Father  37   Arthritis Father    Depression Father    Heart disease Father    Depression Paternal Uncle    Depression Paternal Uncle    Ovarian cancer Maternal Grandmother    Cancer Maternal Grandmother    Hearing loss Maternal Grandfather    Heart disease Maternal Grandfather    Hypertension Maternal Grandfather    Stroke Maternal Grandfather    Colon cancer Paternal Grandfather 42   Cancer Paternal Grandfather     Medications- reviewed and updated Current Outpatient Medications  Medication Sig Dispense Refill   amLODipine (NORVASC) 2.5 MG tablet Take 1 tablet (2.5 mg total) by mouth daily. 90 tablet 3   aspirin EC 81 MG tablet Take 1 tablet (81 mg total) by mouth daily. Swallow whole. (Patient taking differently: Take 81 mg by mouth at bedtime. Swallow whole.) 90 tablet 3   b complex vitamins capsule Take 1 capsule by mouth daily.     bismuth subsalicylate (PEPTO BISMOL) 262 MG/15ML suspension Take 30 mLs by mouth every 6 (six) hours as needed for indigestion or diarrhea or loose stools.     clonazePAM (KLONOPIN) 0.5 MG tablet Take 0.5 mg by mouth 4 (four) times daily as needed for anxiety.     ezetimibe (ZETIA) 10 MG tablet Take 1 tablet (10 mg total) by mouth daily. (Patient taking differently: Take 10 mg by mouth at bedtime.) 90 tablet 3   finasteride (PROSCAR) 5 MG tablet Take 5 mg by mouth at bedtime.     fluvoxaMINE (LUVOX) 100 MG tablet Take 200 mg by mouth at bedtime.     lamoTRIgine (LAMICTAL) 200 MG tablet Take 200 mg by mouth at bedtime.     multivitamin (ONE-A-DAY MEN'S) TABS tablet Take 1 tablet by mouth at bedtime.     PREVIDENT 5000 BOOSTER PLUS 1.1 % PSTE Place 1 Application onto teeth daily.     rosuvastatin (CRESTOR) 20 MG tablet Take 1 tablet (20 mg total) by mouth daily. (Patient taking differently: Take 20 mg by mouth at bedtime.) 90 tablet 3   sildenafil (VIAGRA) 50 MG tablet Take 1 tablet (50 mg total) by mouth daily as needed for erectile dysfunction (Use 1 hour  before sexual activity.). 30 tablet 2   tamsulosin (FLOMAX) 0.4 MG CAPS capsule Take 0.4 mg by mouth at bedtime.     WELLBUTRIN XL 150 MG 24 hr tablet Take 150 mg by mouth every other day.     No current facility-administered medications for this visit.    Allergies-reviewed and updated Allergies  Allergen Reactions   Levofloxacin     Avoid fluoroquinolone antibiotics due to thoracic aortic aneurysm   Penicillins Other (See Comments)    Childhood   Latex Rash    Social History   Social History Narrative   Lives with wife   Right handed   Caffeine: ice tea daily   Objective  Objective:  BP 138/76   Pulse 65  Temp 98 F (36.7 C) (Temporal)   Resp 18   Ht 5\' 10"  (1.778 m)   Wt 167 lb 6 oz (75.9 kg)   SpO2 97%   BMI 24.02 kg/m  Physical Exam  Gen: WDWN NAD HEENT: NCAT, conjunctiva not injected, sclera nonicteric TM WNL B, OP moist, no exudates  NECK:  supple, no thyromegaly, no nodes, no carotid bruits CARDIAC: RRR, S1S2+, no murmur. DP 2+B LUNGS: CTAB. No wheezes ABDOMEN:  BS+, soft, No HSM, no masses. +Mild Tenderness to palpation EXT:  no edema MSK: no gross abnormalities. MS 5/5 all 4 NEURO: A&O x3.  CN II-XII intact. +Hand tremors, bilaterally some at rest, worse w/intention PSYCH: +Anxious. Good eye contact     Assessment and Plan   Health Maintenance counseling: 1. Anticipatory guidance: Patient counseled regarding regular dental exams q6 months, eye exams yearly, avoiding smoking and second hand smoke, limiting alcohol to 2 beverages per day.   2. Risk factor reduction:  Advised patient of need for regular exercise and diet rich in fruits and vegetables to reduce risk of heart attack and stroke. Exercise- Not exercising regularly.   Wt Readings from Last 3 Encounters:  03/11/23 167 lb (75.8 kg)  03/11/23 167 lb 6 oz (75.9 kg)  01/20/23 171 lb 8 oz (77.8 kg)   3. Immunizations/screenings/ancillary studies Immunization History  Administered Date(s)  Administered   Influenza, High Dose Seasonal PF 07/08/2022   Influenza, Seasonal, Injecte, Preservative Fre 04/11/2014   Influenza,inj,Quad PF,6+ Mos 05/14/2015, 04/11/2016   Influenza-Unspecified 04/22/2021   PFIZER(Purple Top)SARS-COV-2 Vaccination 08/07/2019, 08/28/2019, 05/07/2020, 10/08/2020, 03/18/2021   PNEUMOCOCCAL CONJUGATE-20 05/06/2021   Zoster Recombinant(Shingrix) 12/04/2020, 06/10/2021   Health Maintenance Due  Topic Date Due   DTaP/Tdap/Td (1 - Tdap) Never done    4. Prostate cancer screening >55yo - risk factors? No results found for: "PSA"  5. Colon cancer screening: Last done 2019, next colonoscopy scheduled for later this year.  6. Skin cancer screening- Pt was advised regular sunscreen use. Denies worrisome, changing, or new skin lesions.  7. Smoking associated screening (lung cancer screening, AAA screen 65-75, UA)- former smoker, quit in 2015 8. STD screening - N/a  Wellness examination  Taking multiple medications for chronic disease  Tremor   Wellness-antic guidance.  Advised to avoid walking in dark.  Labs utd. He will get immunizations at pharm.   Mult med-pt concerned w/fatigue, interactions, etc.  Requesting Pharm D review. Tremors-may partly be anxiety, meds, benign essential tremor-get weighted utensils.  F/u Neuro  Recommended follow up: Return in about 6 months (around 09/08/2023) for chronic follow-up.  Lab/Order associations: +fasting    I,Rachel Rivera,acting as a scribe for Angelena Sole, MD.,have documented all relevant documentation on the behalf of Angelena Sole, MD,as directed by  Angelena Sole, MD while in the presence of Angelena Sole, MD.  I, Angelena Sole, MD, have reviewed all documentation for this visit. The documentation on 03/11/23 for the exam, diagnosis, procedures, and orders are all accurate and complete.   Angelena Sole, MD

## 2023-03-12 ENCOUNTER — Telehealth: Payer: Self-pay

## 2023-03-12 ENCOUNTER — Encounter (HOSPITAL_COMMUNITY): Payer: Self-pay

## 2023-03-12 NOTE — Progress Notes (Signed)
Choose an anesthesia record to view details        DISCUSSION: Eugene Guzman is a 67 yo male who presents to PAT prior to TURP on 03/19/23 with Dr. Berneice Heinrich. PMH significant for former smoking, CAD, TAA, bicuspid aortic valve, mild AI, HTN, anxiety, depression, OSA (no CPAP use), CLL.  Patient follows with Cardiology for hx of CAD. He underwent cardiac cath on 04/28/2022 which showed moderate multivessel non-obstructive CAD. Medical management recommended. Last saw Cardiology on 12/30/22. He reported occasional dizziness with standing which was felt to be non-cardiac. No further testing or recommendations.  He follows with CT surgery for TAA measuring 4.5cm on CTA Chest from 12/23/22. This has been stable compared to prior scans. Last seen in clinic on 12/28/22. Advised f/u in 6 months.  Patient was recently diagnosed with CLL in April 2024. Recommendations are for monitoring at this time. Last seen in clinic on 12/04/22 by Dr. Candise Che but he also sought a 2nd opinion at [facility] on 02/24/23.  Follows with PCP for other chronic medical issues. He reported dizziness to his PCP as well. They felt it was multifactorial due to polypharmacy and grief (mother recently passed). Advised to get up slowly, eat and drink.   VS: BP 133/82   Pulse 62   Temp 36.7 C (Oral)   Resp 16   Ht 5\' 10"  (1.778 m)   Wt 75.8 kg   SpO2 100%   BMI 23.96 kg/m   PROVIDERS: [name], MD Cardiologist - Dietrich Pates MD  CT Surgery: Charlett Lango, MD Oncology: Wyvonnia Lora, MD  LABS: Labs reviewed: Acceptable for surgery. (all labs ordered are listed, but only abnormal results are displayed)  Labs Reviewed - No data to display   IMAGES:  CTA Chest 12/23/2022:  IMPRESSION: 1. Stable 4.4 x 4.5 cm ascending thoracic aortic aneurysm. Recommend semi-annual imaging followup by CTA or MRA and referral to cardiothoracic surgery if not already obtained. This recommendation follows 2010  ACCF/AHA/AATS/ACR/ASA/SCA/SCAI/SIR/STS/SVM Guidelines for the Diagnosis and Management of Patients With Thoracic Aortic Disease. Circulation. 2010; 121: L875-I43. Aortic aneurysm NOS (ICD10-I71.9) 2. Stable small enhancing area in the liver, possibly a flash filling hemangioma. 3. No acute process in the chest.   Aortic Atherosclerosis (ICD10-I70.0).   EKG:   CV:  Left heart cath 04/28/2022:  POST-OP DIAGNOSIS Correct findings on stress PET: No obstructive disease Moderate multivessel CAD, calcified: Mid LAD 60% (RFR 0.94-not significant, apical LAD RFR was still not significant-0.92.), ostial LCx 55%, mid RCA 55%. Normal LV function, normal LVEDP     RECOMMENDATIONS Continue to titrate medications for cardiac risk factor reduction Consider increasing calcium channel blocker for likely microvascular disease based on stress PET results.  Stress test 04/21/2022:   The perfusion study is normal. The study is low-intermediate risk due to mildly reduced myocardial blood flow reserve.   LV perfusion is normal. There is no evidence of ischemia. There is no evidence of infarction.   Rest left ventricular function is normal. Rest EF: 59 %. Stress left ventricular function is normal. Stress EF: 65 %. End diastolic cavity size is normal. End systolic cavity size is normal. No evidence of transient ischemic dilation (TID) noted.   Myocardial blood flow was computed to be 1.75ml/g/min at rest and 1.14ml/g/min at stress. Global myocardial blood flow reserve was 1.88 and was mildly abnormal.   Coronary calcium was present on the attenuation correction CT images. Severe coronary calcifications were present. Coronary calcifications were present in the left anterior descending artery,  left circumflex artery and right coronary artery distribution(s).   Electronically signed by: Parke Poisson, MD  Echo 07/16/2021:  IMPRESSIONS     1. Left ventricular ejection fraction, by estimation, is 60 to  65%. The  left ventricle has normal function. The left ventricle has no regional  wall motion abnormalities. Left ventricular diastolic parameters are  consistent with Grade I diastolic  dysfunction (impaired relaxation).   2. Right ventricular systolic function is normal. The right ventricular  size is normal.   3. Left atrial size was mildly dilated.   4. The mitral valve is normal in structure. No evidence of mitral valve  regurgitation. No evidence of mitral stenosis.   5. The aortic valve is bicuspid. There is mild calcification of the  aortic valve. Aortic valve regurgitation is mild. Aortic valve  sclerosis/calcification is present, without any evidence of aortic  stenosis.   6. Aortic dilatation noted. There is moderate dilatation of the aortic  root and of the ascending aorta, measuring 45 mm.   7. The inferior vena cava is dilated in size with >50% respiratory  variability, suggesting right atrial pressure of 8 mmHg.   Past Medical History:  Diagnosis Date   Allergy    childhood - penicillin, 2014 - latex   Anxiety    Aortic aneurysm (HCC)    Arthritis 2014   right shoulder   CLL (chronic lymphocytic leukemia) (HCC)    Coronary artery calcification seen on CT scan    Depressed    Hyperlipidemia    Hypertension    Sleep apnea 2022   Have stopped using CPAP    Past Surgical History:  Procedure Laterality Date   CORONARY PRESSURE/FFR STUDY N/A 04/28/2022   Procedure: INTRAVASCULAR PRESSURE WIRE/FFR STUDY;  Surgeon: [name], MD;  Location: [facility] INVASIVE CV LAB;  Service: Cardiovascular;  Laterality: N/A;   LEFT HEART CATH AND CORONARY ANGIOGRAPHY N/A 04/28/2022   Procedure: LEFT HEART CATH AND CORONARY ANGIOGRAPHY;  Surgeon: [name], MD;  Location: [facility] INVASIVE CV LAB;  Service: Cardiovascular;  Laterality: N/A;   TONSILLECTOMY  1961    MEDICATIONS:  amLODipine (NORVASC) 2.5 MG tablet   aspirin EC 81 MG tablet   b complex vitamins capsule    bismuth subsalicylate (PEPTO BISMOL) 262 MG/15ML suspension   clonazePAM (KLONOPIN) 0.5 MG tablet   ezetimibe (ZETIA) 10 MG tablet   finasteride (PROSCAR) 5 MG tablet   fluvoxaMINE (LUVOX) 100 MG tablet   lamoTRIgine (LAMICTAL) 200 MG tablet   multivitamin (ONE-A-DAY MEN'S) TABS tablet   PREVIDENT 5000 BOOSTER PLUS 1.1 % PSTE   rosuvastatin (CRESTOR) 20 MG tablet   sildenafil (VIAGRA) 50 MG tablet   tamsulosin (FLOMAX) 0.4 MG CAPS capsule   WELLBUTRIN XL 150 MG 24 hr tablet   No current facility-administered medications for this encounter.   Marcille Blanco [facility] Surgical Short Stay/Anesthesiology [facility] Phone [phone] 03/12/2023 2:15 PM

## 2023-03-12 NOTE — Anesthesia Preprocedure Evaluation (Addendum)
Anesthesia Evaluation  Patient identified by MRN, date of birth, ID band Patient awake    Reviewed: Allergy & Precautions, NPO status , Patient's Chart, lab work & pertinent test results  Airway Mallampati: III  TM Distance: >3 FB Neck ROM: Full    Dental no notable dental hx. (+) Implants, Dental Advisory Given   Pulmonary sleep apnea , former smoker   Pulmonary exam normal breath sounds clear to auscultation       Cardiovascular hypertension, Pt. on medications + CAD and + Peripheral Vascular Disease (TAA measuring 4.5cm on CTA Chest from 12/23/22)  Normal cardiovascular exam+ Valvular Problems/Murmurs AI  Rhythm:Regular Rate:Normal  10/23 The perfusion study is normal. The study is low-intermediate risk due to mildly reduced myocardial blood flow reserve.   LV perfusion is normal. There is no evidence of ischemia. There is no evidence of infarction.   Rest left ventricular function is normal. Rest EF: 59 %. Stress left ventricular function is normal. Stress EF: 65 %. End diastolic cavity size is normal. End systolic cavity size is normal. No evidence of transient ischemic dilation (TID) noted.    Neuro/Psych  PSYCHIATRIC DISORDERS Anxiety Depression       GI/Hepatic Neg liver ROS,,,  Endo/Other  negative endocrine ROS    Renal/GU Lab Results      Component                Value               Date                           K                        4.2                 01/20/2023                     bph    Musculoskeletal  (+) Arthritis ,    Abdominal   Peds  Hematology CLL Lab Results      Component                Value               Date                      WBC                      15.6 (H)            01/20/2023                HGB                      12.6 (L)            01/20/2023                HCT                      38.4 (L)            01/20/2023                MCV  94.6                 01/20/2023                PLT                      160.0               01/20/2023              Anesthesia Other Findings All: Latex, levofloxin, PCN  Reproductive/Obstetrics                             Anesthesia Physical Anesthesia Plan  ASA: 3  Anesthesia Plan: General   Post-op Pain Management:    Induction: Intravenous  PONV Risk Score and Plan: 3 and Treatment may vary due to age or medical condition, Midazolam and Ondansetron  Airway Management Planned: LMA  Additional Equipment: None  Intra-op Plan:   Post-operative Plan:   Informed Consent: I have reviewed the patients History and Physical, chart, labs and discussed the procedure including the risks, benefits and alternatives for the proposed anesthesia with the patient or authorized representative who has indicated his/her understanding and acceptance.     Dental advisory given  Plan Discussed with:   Anesthesia Plan Comments: (See PAT note from 9/5 by Sherlie Ban PA-C )        Anesthesia Quick Evaluation

## 2023-03-12 NOTE — Progress Notes (Signed)
   Care Guide Note  03/12/2023 Name: Eugene Guzman Eugene Guzman. MRN: [id number] DOB: Eugene Guzman, Eugene Guzman  Referred by: [name], MD Reason for referral : Care Coordination (Outreach to schedule with pharm d )   Eugene Guzman. is a 67 y.o. year old male who is a primary care patient of [name], MD. Eugene Guzman. was referred to the pharmacist for assistance related to HTN and HLD.    Successful contact was made with the patient to discuss pharmacy services including being ready for the pharmacist to call at least 5 minutes before the scheduled appointment time, to have medication bottles and any blood sugar or blood pressure readings ready for review. The patient agreed to meet with the pharmacist via with the pharmacist via telephone visit on (date/time).  03/23/2023  Eugene Guzman, Eugene Guzman Care Guide [facility]  [place], [place] [address] Direct Dial: [phone] [email or web address]

## 2023-03-17 ENCOUNTER — Ambulatory Visit: Payer: Medicare HMO | Admitting: Psychology

## 2023-03-17 DIAGNOSIS — F331 Major depressive disorder, recurrent, moderate: Secondary | ICD-10-CM | POA: Diagnosis not present

## 2023-03-17 NOTE — Progress Notes (Signed)
[facility] Counselor Initial Adult Exam  Name: Eugene Guzman. Date: 03/17/2023 MRN: [id number] DOB: 26-Nov-1955 PCP: [name], MD  Time spent: 60 minutes  Guardian/Payee:  N/A    Paperwork requested: No   Reason for Visit /Presenting Problem: Symptoms of Depression, Grief and Panic   Mental Status Exam: Appearance:   Casual     Behavior:  Appropriate  Motor:  Normal  Speech/Language:   Clear and Coherent  Affect:  Appropriate  Mood:  depressed  Thought process:  normal  Thought content:    WNL  Sensory/Perceptual disturbances:    WNL  Orientation:  oriented to person, place, and situation  Attention:  Good  Concentration:  Good  Memory:  WNL  Fund of knowledge:   Good  Insight:    Good  Judgment:   Good  Impulse Control:  Good    Reported Symptoms:  sadness, powerlessness, anxiety/panic  Risk Assessment: Danger to Self:  No Self-injurious Behavior: No Danger to Others: No Duty to Warn:no Physical Aggression / Violence:No  Access to Firearms a concern: No  Gang Involvement:No  Patient / guardian was educated about steps to take if suicide or homicide risk level increases between visits: n/a While future psychiatric events cannot be accurately predicted, the patient does not currently require acute inpatient psychiatric care and does not currently meet [facility] involuntary commitment criteria.  Substance Abuse History: Current substance abuse: No     Past Psychiatric History:   Previous psychological history is significant for depression and panic Outpatient Providers:Lisa Polis History of Psych Hospitalization: No  Psychological Testing:  N/A    Abuse History:  Victim of: No.,  N/A    Report needed: No. Victim of Neglect:No. Perpetrator of  N/A   Witness / Exposure to Domestic Violence: No   Protective Services Involvement: No  Witness to [employer] Violence:   No   Family History:  Family History  Problem Relation Age of Onset   Hypertension Mother    Cancer Mother    Hearing loss Mother    Heart attack Father 62   Arthritis Father    Depression Father    Heart disease Father    Depression Paternal Uncle    Depression Paternal Uncle    Ovarian cancer Maternal Grandmother    Cancer Maternal Grandmother    Hearing loss Maternal Grandfather    Heart disease Maternal Grandfather    Hypertension Maternal Grandfather    Stroke Maternal Grandfather    Colon cancer Paternal Grandfather 75   Cancer Paternal Grandfather     Living situation: the patient lives with their spouse  Sexual Orientation: Straight  Relationship Status: married  Name of spouse / other:unknown If a parent, number of children / ages:none  Support Systems: N/A  Surveyor, quantity Stress:  No   Income/Employment/Disability: Social Herbalist:  unknown  Educational History: Education: Risk manager: unknown  Any cultural differences that may affect / interfere with treatment:  not applicable   Recreation/Hobbies: unknown  Stressors: Loss of mother    Strengths: Supportive Relationships and Able to Communicate Effectively  Barriers:  unknown   Legal History: Pending legal issue / charges: The patient has no significant history of legal issues. History of legal issue / charges:  N/A  Medical  History/Surgical History: reviewed Past Medical History:  Diagnosis Date   Allergy    childhood - penicillin, 2014 - latex   Anxiety    Aortic aneurysm (HCC)    Arthritis 2014   right shoulder   CLL (chronic lymphocytic leukemia) (HCC)    Coronary artery calcification seen on CT scan    Depressed    Hyperlipidemia    Hypertension    Sleep apnea 2022   Have stopped using CPAP    Past Surgical History:  Procedure Laterality Date   CORONARY PRESSURE/FFR STUDY N/A 04/28/2022   Procedure:  INTRAVASCULAR PRESSURE WIRE/FFR STUDY;  Surgeon: [name], MD;  Location: [facility] INVASIVE CV LAB;  Service: Cardiovascular;  Laterality: N/A;   LEFT HEART CATH AND CORONARY ANGIOGRAPHY N/A 04/28/2022   Procedure: LEFT HEART CATH AND CORONARY ANGIOGRAPHY;  Surgeon: [name], MD;  Location: [facility] INVASIVE CV LAB;  Service: Cardiovascular;  Laterality: N/A;   TONSILLECTOMY  1961    Medications: Current Outpatient Medications  Medication Sig Dispense Refill   amLODipine (NORVASC) 2.5 MG tablet Take 1 tablet (2.5 mg total) by mouth daily. 90 tablet 3   aspirin EC 81 MG tablet Take 1 tablet (81 mg total) by mouth daily. Swallow whole. (Patient taking differently: Take 81 mg by mouth at bedtime. Swallow whole.) 90 tablet 3   b complex vitamins capsule Take 1 capsule by mouth daily.     bismuth subsalicylate (PEPTO BISMOL) 262 MG/15ML suspension Take 30 mLs by mouth every 6 (six) hours as needed for indigestion or diarrhea or loose stools.     clonazePAM (KLONOPIN) 0.5 MG tablet Take 0.5 mg by mouth 4 (four) times daily as needed for anxiety.     ezetimibe (ZETIA) 10 MG tablet Take 1 tablet (10 mg total) by mouth daily. (Patient taking differently: Take 10 mg by mouth at bedtime.) 90 tablet 3   finasteride (PROSCAR) 5 MG tablet Take 5 mg by mouth at bedtime.     fluvoxaMINE (LUVOX) 100 MG tablet Take 200 mg by mouth at bedtime.     lamoTRIgine (LAMICTAL) 200 MG tablet Take 200 mg by mouth at bedtime.     multivitamin (ONE-A-DAY MEN'S) TABS tablet Take 1 tablet by mouth at bedtime.     PREVIDENT 5000 BOOSTER PLUS 1.1 % PSTE Place 1 Application onto teeth daily.     rosuvastatin (CRESTOR) 20 MG tablet Take 1 tablet (20 mg total) by mouth daily. (Patient taking differently: Take 20 mg by mouth at bedtime.) 90 tablet 3   sildenafil (VIAGRA) 50 MG tablet Take 1 tablet (50 mg total) by mouth daily as needed for erectile dysfunction (Use 1 hour before sexual activity.). 30 tablet 2   tamsulosin  (FLOMAX) 0.4 MG CAPS capsule Take 0.4 mg by mouth at bedtime.     WELLBUTRIN XL 150 MG 24 hr tablet Take 150 mg by mouth every other day.     No current facility-administered medications for this visit.    Allergies  Allergen Reactions   Levofloxacin     Avoid fluoroquinolone antibiotics due to thoracic aortic aneurysm   Penicillins Other (See Comments)    Childhood   Latex Rash    Diagnoses:  Major Depression, Panic, Grief  Plan of Care: Outpatient Psychotherapy and medication management  Initial Note: He states he has taken psychotropic medication for years and sees Clayton Lefort at [facility]. States [place] and feels he has suffered depression most of his life.  He only took vacation once a year and would get really depressed when it was over. First significant depression was at 33 when he had a break-up. Had more minor depressive episodes earlier in life. Change has always been difficult for him. Also has had severe panic attacks. First one he remembers was first day of his fifth year of college. He was distressed that "this was the end of his college career". He recalls having a panic when on business trip to [place] in a [employer]. He had to come home. 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 He was offered early retirement and had a panic when he realized that he just quit a job of 30 years and his life was changing. When he has panic, he doesn't have a fear of death, rather it is a fear of something he can't control. His anxiety was less prevalent in his life than the depression.  He says "my major problem in life is depression and panic". His mother passed and he is struggling with her loss. She died 2 weeks ago at 41 and was a vibrant person for 92 years. In 1 year, she had rapid dementia. He says, "she was his best friend". He has a small farm down the road from where his mother lived. He says "I don't handle death well". His  reason to seek therapy was before mother was deteriorating. He had tried therapy in the past and it was not helpful. Also, he has had medical challenges himself, but says it does not cause him distress. He has been on a number of different anti-depressants over the years.  He contacted me for his depression, ut says the grief is now most prevalent.  He has 1 sister in [place]. She is 3 years older and they have a good relationship, but she has kids and grandchildren and they keep her busy. He married at 87 and wife was 55. His wife will occasionally tell him she blames him for not having kids. He told her when they married that he was too old to have kids. He says it was his "inability to handle change". This is his only marriage for both he and his wife. Wife is from [place] and has 3 sisters and one brother. None of her family in this area. He left [employer] at 28 ("too early"). 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. Wife retired in 2013.  Will complete intake at next visit. Patient states he wants to continue treatment and prefers face to face when possible.   Goals/Tx. Plan: Patient states that he is seeking symptom relief from life long depressive episodes. First, however, he needs support and strategies to manage grief over the recent (2 weeks) loss of his mother. Will engage in grief counseling to reduce current despair. In addition, treatment will address his anxiety that contributes to episodic panic attacks. This will involve relaxation techniques and other behavioral strategies  to facilitate greater control over his anxiety. Patient agrees to plan with a goal date of 12-24.   Patient was seen face to face in Provider's office.  Session note: States that he has surgery this Friday at noon. He says he is a little scared and has never spent the night in the hospital. He is getting overwhelmed about all the paperwork related to his mom's estate. There are trusts involved and other  legal issues associated with it. He says "nothing  is easily resolved" and takes a while to work through. His brother in law is helping with the financial components, and [name] is having to deal with his own portion of the estate. He just feels that he cannot manage all that needs to be done. He does feel he would make progress if he could get out of bed earlier. He has trouble because he doesn't sleep well and he dreads all the work that has to be done.  He claims that his wife will not compromise about their house. He claims he needs quiet and isolation in order to get things done. He is easily distracted. Feels that time availability is a problem, especially when he has to drive to the far 45 minutes away.            Garrel Ridgel, PhD 11:40a-12:30p 50 minutes

## 2023-03-18 DIAGNOSIS — L578 Other skin changes due to chronic exposure to nonionizing radiation: Secondary | ICD-10-CM | POA: Diagnosis not present

## 2023-03-18 DIAGNOSIS — D229 Melanocytic nevi, unspecified: Secondary | ICD-10-CM | POA: Diagnosis not present

## 2023-03-18 DIAGNOSIS — D1801 Hemangioma of skin and subcutaneous tissue: Secondary | ICD-10-CM | POA: Diagnosis not present

## 2023-03-18 DIAGNOSIS — L821 Other seborrheic keratosis: Secondary | ICD-10-CM | POA: Diagnosis not present

## 2023-03-18 DIAGNOSIS — L570 Actinic keratosis: Secondary | ICD-10-CM | POA: Diagnosis not present

## 2023-03-18 DIAGNOSIS — L814 Other melanin hyperpigmentation: Secondary | ICD-10-CM | POA: Diagnosis not present

## 2023-03-19 ENCOUNTER — Encounter (HOSPITAL_COMMUNITY)
Admission: RE | Disposition: A | Discharge status: Home / Self Care | Payer: Self-pay | Source: Home / Self Care | Attending: Urology

## 2023-03-19 ENCOUNTER — Ambulatory Visit (HOSPITAL_BASED_OUTPATIENT_CLINIC_OR_DEPARTMENT_OTHER): Payer: Medicare HMO | Admitting: Anesthesiology

## 2023-03-19 ENCOUNTER — Ambulatory Visit (HOSPITAL_COMMUNITY): Payer: Medicare HMO | Admitting: Medical

## 2023-03-19 ENCOUNTER — Encounter (HOSPITAL_COMMUNITY): Payer: Self-pay | Admitting: Urology

## 2023-03-19 ENCOUNTER — Other Ambulatory Visit: Payer: Self-pay

## 2023-03-19 ENCOUNTER — Observation Stay (HOSPITAL_COMMUNITY)
Admission: RE | Admit: 2023-03-19 | Discharge: 2023-03-20 | Disposition: A | Discharge status: Home / Self Care | Payer: Medicare HMO | Attending: Urology | Admitting: Urology

## 2023-03-19 DIAGNOSIS — R339 Retention of urine, unspecified: Secondary | ICD-10-CM | POA: Diagnosis not present

## 2023-03-19 DIAGNOSIS — R351 Nocturia: Secondary | ICD-10-CM | POA: Diagnosis not present

## 2023-03-19 DIAGNOSIS — N401 Enlarged prostate with lower urinary tract symptoms: Principal | ICD-10-CM | POA: Insufficient documentation

## 2023-03-19 DIAGNOSIS — N138 Other obstructive and reflux uropathy: Secondary | ICD-10-CM

## 2023-03-19 DIAGNOSIS — I10 Essential (primary) hypertension: Secondary | ICD-10-CM

## 2023-03-19 DIAGNOSIS — N400 Enlarged prostate without lower urinary tract symptoms: Principal | ICD-10-CM | POA: Diagnosis present

## 2023-03-19 DIAGNOSIS — Z87891 Personal history of nicotine dependence: Secondary | ICD-10-CM | POA: Diagnosis not present

## 2023-03-19 DIAGNOSIS — I2510 Atherosclerotic heart disease of native coronary artery without angina pectoris: Secondary | ICD-10-CM | POA: Diagnosis not present

## 2023-03-19 DIAGNOSIS — R3914 Feeling of incomplete bladder emptying: Secondary | ICD-10-CM | POA: Diagnosis not present

## 2023-03-19 DIAGNOSIS — Z91040 Latex allergy status: Secondary | ICD-10-CM | POA: Insufficient documentation

## 2023-03-19 DIAGNOSIS — R3915 Urgency of urination: Secondary | ICD-10-CM | POA: Diagnosis not present

## 2023-03-19 HISTORY — PX: TRANSURETHRAL RESECTION OF PROSTATE: SHX73

## 2023-03-19 SURGERY — TURP (TRANSURETHRAL RESECTION OF PROSTATE)
Anesthesia: General | Site: Penis

## 2023-03-19 MED ORDER — FLUVOXAMINE MALEATE 50 MG PO TABS
200.0000 mg | ORAL_TABLET | Freq: Every day | ORAL | Status: DC
  Administered 2023-03-19: 200 mg via ORAL
  Filled 2023-03-19: qty 4

## 2023-03-19 MED ORDER — LAMOTRIGINE 100 MG PO TABS
200.0000 mg | ORAL_TABLET | Freq: Every day | ORAL | Status: DC
  Administered 2023-03-19: 200 mg via ORAL
  Filled 2023-03-19: qty 2

## 2023-03-19 MED ORDER — OXYBUTYNIN CHLORIDE 5 MG PO TABS: 5.0000 mg | ORAL_TABLET | Freq: Three times a day (TID) | ORAL | 0 refills | Status: DC | PRN

## 2023-03-19 MED ORDER — CLONAZEPAM 0.5 MG PO TABS: 0.5000 mg | ORAL_TABLET | Freq: Three times a day (TID) | ORAL | Status: DC | PRN

## 2023-03-19 MED ORDER — OXYCODONE HCL 5 MG PO TABS
5.0000 mg | ORAL_TABLET | ORAL | Status: DC | PRN
  Administered 2023-03-19 – 2023-03-20 (×2): 5 mg via ORAL
  Filled 2023-03-19 (×2): qty 1

## 2023-03-19 MED ORDER — SENNOSIDES-DOCUSATE SODIUM 8.6-50 MG PO TABS
1.0000 | ORAL_TABLET | Freq: Two times a day (BID) | ORAL | Status: DC
  Administered 2023-03-20: 1 via ORAL
  Filled 2023-03-19 (×2): qty 1

## 2023-03-19 MED ORDER — SODIUM CHLORIDE 0.9 % IR SOLN
Status: DC | PRN
  Administered 2023-03-19 (×4): 6000 mL

## 2023-03-19 MED ORDER — DEXAMETHASONE SODIUM PHOSPHATE 4 MG/ML IJ SOLN
INTRAMUSCULAR | Status: DC | PRN
  Administered 2023-03-19: 8 mg via INTRAVENOUS

## 2023-03-19 MED ORDER — FINASTERIDE 5 MG PO TABS
5.0000 mg | ORAL_TABLET | Freq: Every day | ORAL | Status: DC
  Administered 2023-03-19: 5 mg via ORAL
  Filled 2023-03-19: qty 1

## 2023-03-19 MED ORDER — 0.9 % SODIUM CHLORIDE (POUR BTL) OPTIME
TOPICAL | Status: DC | PRN
Start: 2023-03-19 — End: 2023-03-19
  Administered 2023-03-19: 1000 mL

## 2023-03-19 MED ORDER — SENNOSIDES-DOCUSATE SODIUM 8.6-50 MG PO TABS: 1.0000 | ORAL_TABLET | Freq: Two times a day (BID) | ORAL | 0 refills | Status: DC

## 2023-03-19 MED ORDER — MIDAZOLAM HCL 2 MG/2ML IJ SOLN
INTRAMUSCULAR | Status: AC
  Filled 2023-03-19: qty 2

## 2023-03-19 MED ORDER — ONDANSETRON HCL 4 MG/2ML IJ SOLN
INTRAMUSCULAR | Status: DC | PRN
  Administered 2023-03-19: 4 mg via INTRAVENOUS

## 2023-03-19 MED ORDER — EZETIMIBE 10 MG PO TABS
10.0000 mg | ORAL_TABLET | Freq: Every day | ORAL | Status: DC
  Administered 2023-03-19: 10 mg via ORAL
  Filled 2023-03-19: qty 1

## 2023-03-19 MED ORDER — ACETAMINOPHEN 500 MG PO TABS
1000.0000 mg | ORAL_TABLET | Freq: Three times a day (TID) | ORAL | Status: DC
  Administered 2023-03-19 – 2023-03-20 (×2): 1000 mg via ORAL
  Filled 2023-03-19 (×2): qty 2

## 2023-03-19 MED ORDER — LIDOCAINE HCL (PF) 2 % IJ SOLN
INTRAMUSCULAR | Status: AC
  Filled 2023-03-19: qty 5

## 2023-03-19 MED ORDER — FENTANYL CITRATE PF 50 MCG/ML IJ SOSY
25.0000 ug | PREFILLED_SYRINGE | INTRAMUSCULAR | Status: DC | PRN
  Administered 2023-03-19: 25 ug via INTRAVENOUS

## 2023-03-19 MED ORDER — ROSUVASTATIN CALCIUM 20 MG PO TABS
20.0000 mg | ORAL_TABLET | Freq: Every day | ORAL | Status: DC
  Administered 2023-03-19: 20 mg via ORAL
  Filled 2023-03-19: qty 1

## 2023-03-19 MED ORDER — DEXAMETHASONE SODIUM PHOSPHATE 10 MG/ML IJ SOLN
INTRAMUSCULAR | Status: AC
  Filled 2023-03-19: qty 1

## 2023-03-19 MED ORDER — FENTANYL CITRATE PF 50 MCG/ML IJ SOSY
PREFILLED_SYRINGE | INTRAMUSCULAR | Status: AC
  Filled 2023-03-19: qty 1

## 2023-03-19 MED ORDER — PROPOFOL 10 MG/ML IV BOLUS
INTRAVENOUS | Status: AC
  Filled 2023-03-19: qty 20

## 2023-03-19 MED ORDER — SULFAMETHOXAZOLE-TRIMETHOPRIM 800-160 MG PO TABS: 1.0000 | ORAL_TABLET | Freq: Two times a day (BID) | ORAL | 0 refills | Status: DC

## 2023-03-19 MED ORDER — PROPOFOL 10 MG/ML IV BOLUS
INTRAVENOUS | Status: DC | PRN
  Administered 2023-03-19: 150 mg via INTRAVENOUS

## 2023-03-19 MED ORDER — SODIUM CHLORIDE 0.9 % IV SOLN: INTRAVENOUS | Status: DC

## 2023-03-19 MED ORDER — AMLODIPINE BESYLATE 5 MG PO TABS
2.5000 mg | ORAL_TABLET | Freq: Every day | ORAL | Status: DC
  Administered 2023-03-20: 2.5 mg via ORAL
  Filled 2023-03-19: qty 1

## 2023-03-19 MED ORDER — OXYBUTYNIN CHLORIDE ER 5 MG PO TB24
10.0000 mg | ORAL_TABLET | Freq: Every day | ORAL | Status: DC
  Administered 2023-03-19 – 2023-03-20 (×2): 10 mg via ORAL
  Filled 2023-03-19 (×2): qty 2

## 2023-03-19 MED ORDER — LIDOCAINE 2% (20 MG/ML) 5 ML SYRINGE
INTRAMUSCULAR | Status: DC | PRN
  Administered 2023-03-19: 60 mg via INTRAVENOUS

## 2023-03-19 MED ORDER — ORAL CARE MOUTH RINSE: 15.0000 mL | Freq: Once | OROMUCOSAL | Status: AC

## 2023-03-19 MED ORDER — ONDANSETRON HCL 4 MG/2ML IJ SOLN
INTRAMUSCULAR | Status: AC
  Filled 2023-03-19: qty 2

## 2023-03-19 MED ORDER — OXYCODONE-ACETAMINOPHEN 5-325 MG PO TABS
1.0000 | ORAL_TABLET | Freq: Four times a day (QID) | ORAL | 0 refills | Status: DC | PRN
Start: 2023-03-19 — End: 2024-03-18

## 2023-03-19 MED ORDER — STERILE WATER FOR IRRIGATION IR SOLN
Status: DC | PRN
Start: 2023-03-19 — End: 2023-03-19
  Administered 2023-03-19: 250 mL

## 2023-03-19 MED ORDER — ORAL CARE MOUTH RINSE: 15.0000 mL | OROMUCOSAL | Status: DC | PRN

## 2023-03-19 MED ORDER — CHLORHEXIDINE GLUCONATE 0.12 % MT SOLN
15.0000 mL | Freq: Once | OROMUCOSAL | Status: AC
  Administered 2023-03-19: 15 mL via OROMUCOSAL

## 2023-03-19 MED ORDER — GENTAMICIN SULFATE 40 MG/ML IJ SOLN
5.0000 mg/kg | INTRAVENOUS | Status: AC
  Administered 2023-03-19: 389.2 mg via INTRAVENOUS
  Filled 2023-03-19: qty 9.75

## 2023-03-19 MED ORDER — MIDAZOLAM HCL 5 MG/5ML IJ SOLN
INTRAMUSCULAR | Status: DC | PRN
  Administered 2023-03-19: 2 mg via INTRAVENOUS

## 2023-03-19 MED ORDER — LACTATED RINGERS IV SOLN: INTRAVENOUS | Status: DC

## 2023-03-19 MED ORDER — HYDROMORPHONE HCL 1 MG/ML IJ SOLN: 0.5000 mg | INTRAMUSCULAR | Status: DC | PRN

## 2023-03-19 MED ORDER — FENTANYL CITRATE (PF) 100 MCG/2ML IJ SOLN
INTRAMUSCULAR | Status: DC | PRN
  Administered 2023-03-19: 50 ug via INTRAVENOUS

## 2023-03-19 MED ORDER — ONDANSETRON HCL 4 MG/2ML IJ SOLN: 4.0000 mg | Freq: Once | INTRAMUSCULAR | Status: DC | PRN

## 2023-03-19 MED ORDER — ACETAMINOPHEN 10 MG/ML IV SOLN: 1000.0000 mg | Freq: Once | INTRAVENOUS | Status: DC | PRN

## 2023-03-19 MED ORDER — FENTANYL CITRATE (PF) 100 MCG/2ML IJ SOLN
INTRAMUSCULAR | Status: AC
  Filled 2023-03-19: qty 2

## 2023-03-19 SURGICAL SUPPLY — 21 items
BAG DRN RND TRDRP ANRFLXCHMBR (UROLOGICAL SUPPLIES) ×1
BAG URINE DRAIN 2000ML AR STRL (UROLOGICAL SUPPLIES) ×1 IMPLANT
BAG URO CATCHER STRL LF (MISCELLANEOUS) ×1 IMPLANT
CATH SILICONE 22FR 30CC 3WAY (CATHETERS) IMPLANT
DRAPE FOOT SWITCH (DRAPES) ×1 IMPLANT
ELECT REM PT RETURN 15FT ADLT (MISCELLANEOUS) IMPLANT
GOWN STRL REUS W/ TWL XL LVL3 (GOWN DISPOSABLE) ×1 IMPLANT
GOWN STRL REUS W/TWL XL LVL3 (GOWN DISPOSABLE) ×1
GUIDEWIRE STR DUAL SENSOR (WIRE) IMPLANT
HOLDER FOLEY CATH W/STRAP (MISCELLANEOUS) IMPLANT
IV CATH AUTO 14GX1.75 SAFE ORG (IV SOLUTION) IMPLANT
KIT TURNOVER KIT A (KITS) IMPLANT
LOOP CUT BIPOLAR 24F LRG (ELECTROSURGICAL) IMPLANT
MANIFOLD NEPTUNE II (INSTRUMENTS) ×1 IMPLANT
PACK CYSTO (CUSTOM PROCEDURE TRAY) ×1 IMPLANT
SYR 30ML LL (SYRINGE) ×1 IMPLANT
SYR TOOMEY IRRIG 70ML (MISCELLANEOUS) ×1
SYRINGE TOOMEY IRRIG 70ML (MISCELLANEOUS) ×1 IMPLANT
TUBING CONNECTING 10 (TUBING) ×1 IMPLANT
TUBING UROLOGY SET (TUBING) ×1 IMPLANT
WATER STERILE IRR 500ML POUR (IV SOLUTION) IMPLANT

## 2023-03-19 NOTE — Brief Op Note (Signed)
03/19/2023  4:36 PM  PATIENT:  Eugene Guzman.  67 y.o. male  PRE-OPERATIVE DIAGNOSIS:  BENIGN PROSTATIC HYPERTROPHY  POST-OPERATIVE DIAGNOSIS:  BENIGN PROSTATIC HYPERTROPHY  PROCEDURE:  Procedure(s): TRANSURETHRAL RESECTION OF THE PROSTATE (TURP) (N/A)  SURGEON:  Surgeons and Role:    * Denia Mcvicar, Delbert Phenix., MD - Primary  PHYSICIAN ASSISTANT:   ASSISTANTS: none   ANESTHESIA:   general  EBL:   BLOOD ADMINISTERED:none  DRAINS:  47F 3 way foley to NS irrigation    LOCAL MEDICATIONS USED:  NONE  SPECIMEN:  Source of Specimen:  prostate chips  DISPOSITION OF SPECIMEN:  PATHOLOGY  COUNTS:  YES  TOURNIQUET:  * No tourniquets in log *  DICTATION: .Other Dictation: Dictation Number [id number]  PLAN OF CARE: Admit for overnight observation  PATIENT DISPOSITION:  PACU - hemodynamically stable.   Delay start of Pharmacological VTE agent (>24hrs) due to surgical blood loss or risk of bleeding: yes

## 2023-03-19 NOTE — Anesthesia Procedure Notes (Signed)
Procedure Name: LMA Insertion Date/Time: 03/19/2023 3:43 PM  Performed by: Johnette Abraham, CRNAPre-anesthesia Checklist: Patient identified, Emergency Drugs available, Suction available and Patient being monitored Patient Re-evaluated:Patient Re-evaluated prior to induction Oxygen Delivery Method: Circle System Utilized Preoxygenation: Pre-oxygenation with 100% oxygen Induction Type: IV induction Ventilation: Mask ventilation without difficulty LMA: LMA inserted LMA Size: 4.0 Number of attempts: 1 Airway Equipment and Method: Bite block Placement Confirmation: positive ETCO2 Tube secured with: Tape Dental Injury: Teeth and Oropharynx as per pre-operative assessment

## 2023-03-19 NOTE — H&P (Signed)
Eugene Guzman. is an 67 y.o. male.    Chief Complaint: Pre-OP Transurethral Resection of Prostate  HPI:   1- Urinary Urgency / Nocturia / Incomplete Emptying - slowly progressive bother from mostly irritative symptoms and age-appropriate nocturia x 2. DRE 70gm. PVR , UA normal, 2022 and starting finasteride. Worsenign to nocturia x 10 and PVR 12/2022 on finaserine. CT 12/2022 50gm prostate with some medain. UDS with mostly obstructive picutre (no real instability) with Qm6 at PDetMax 93. Cysto 01/2023 confirms prostatic hypertrophy only, no strictures.   PMH sig for TNA, Mild CLL (observation). NO ischemic CV disease / blood thinners. 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 His mother is also a pt of mine. His PCP is Sherrilyn Rist MD.   Today " Eugene Guzman" is seen to proceed with TURP. No interval fevers. Most recent UA without infectious parameters. Hgb 13.1 on recetn labs through [facility].   Past Medical History:  Diagnosis Date   Allergy    childhood - penicillin, 2014 - latex   Anxiety    Aortic aneurysm (HCC)    Arthritis 2014   right shoulder   CLL (chronic lymphocytic leukemia) (HCC)    Coronary artery calcification seen on CT scan    Depressed    Hyperlipidemia    Hypertension    Sleep apnea 2022   Have stopped using CPAP    Past Surgical History:  Procedure Laterality Date   CORONARY PRESSURE/FFR STUDY N/A 04/28/2022   Procedure: INTRAVASCULAR PRESSURE WIRE/FFR STUDY;  Surgeon: [name], MD;  Location: [facility] INVASIVE CV LAB;  Service: Cardiovascular;  Laterality: N/A;   LEFT HEART CATH AND CORONARY ANGIOGRAPHY N/A 04/28/2022   Procedure: LEFT HEART CATH AND CORONARY ANGIOGRAPHY;  Surgeon: [name], MD;  Location: [facility] INVASIVE CV LAB;  Service: Cardiovascular;  Laterality: N/A;   TONSILLECTOMY  1961    Family History  Problem Relation Age of Onset   Hypertension Mother    Cancer Mother     Hearing loss Mother    Heart attack Father 22   Arthritis Father    Depression Father    Heart disease Father    Depression Paternal Uncle    Depression Paternal Uncle    Ovarian cancer Maternal Grandmother    Cancer Maternal Grandmother    Hearing loss Maternal Grandfather    Heart disease Maternal Grandfather    Hypertension Maternal Grandfather    Stroke Maternal Grandfather    Colon cancer Paternal Grandfather 75   Cancer Paternal Grandfather    Social History:  reports that he quit smoking about 8 years ago. His smoking use included cigarettes. He has a 7.5 pack-year smoking history. He has never used smokeless tobacco. He reports current alcohol use of about 12.0 standard drinks of alcohol per week. He reports that he does not use drugs.  Allergies:  Allergies  Allergen Reactions   Levofloxacin     Avoid fluoroquinolone antibiotics due to thoracic aortic aneurysm   Penicillins Other (See Comments)    Childhood   Latex Rash    No medications prior to admission.    No results found for this or any previous visit (from the past 48 hour(s)). No results found.  Review of Systems  Constitutional:  Negative for chills and fever.  Genitourinary:  Positive for difficulty urinating and urgency.  All other systems reviewed and are negative.   There were no vitals taken for this visit. Physical Exam Vitals reviewed.  HENT:     Head: Normocephalic.     Nose: Nose normal.  Eyes:     Pupils: Pupils are equal, round, and reactive to light.  Cardiovascular:     Rate and Rhythm: Normal rate.  Pulmonary:     Effort: Pulmonary effort is normal.  Abdominal:     General: Abdomen is flat.  Musculoskeletal:        General: Normal range of motion.     Cervical back: Normal range of motion.  Skin:    General: Skin is warm.  Neurological:     General: No focal deficit present.     Mental Status: He is alert.  Psychiatric:        Mood and Affect: Mood normal.       Assessment/Plan  Proceed as planned with TURP. Risks,  benefits, alternatives, expected peri-op course discussed previously and reiterated today.   [name]., MD 03/19/2023, 6:52 AM

## 2023-03-19 NOTE — Transfer of Care (Signed)
Immediate Anesthesia Transfer of Care Note  Patient: Eugene Guzman.  Procedure(s) Performed: TRANSURETHRAL RESECTION OF THE PROSTATE (TURP) (Penis)  Patient Location: PACU  Anesthesia Type:General  Level of Consciousness: awake, alert , oriented, and patient cooperative  Airway & Oxygen Therapy: Patient Spontanous Breathing and Patient connected to face mask oxygen  Post-op Assessment: Report given to RN, Post -op Vital signs reviewed and stable, and Patient moving all extremities  Post vital signs: Reviewed and stable  Last Vitals:  Vitals Value Taken Time  BP 130/78 03/19/23 1645  Temp    Pulse 56 03/19/23 1646  Resp 11 03/19/23 1647  SpO2 99 % 03/19/23 1646  Vitals shown include unfiled device data.  Last Pain:  Vitals:   03/19/23 1232  PainSc: 0-No pain      Patients Stated Pain Goal: 5 (03/19/23 1232)  Complications: No notable events documented.

## 2023-03-19 NOTE — Op Note (Signed)
NAMEAURON, FORRISTER MEDICAL RECORD NO: [id number] ACCOUNT NO: [id number] DATE OF BIRTH: Apr 11, 1956 FACILITY: [facility]ATION: [id number] PHYSICIAN: Sebastian Ache, MD  Operative Report   DATE OF PROCEDURE: 03/19/2023  PREOPERATIVE DIAGNOSIS:  Prostatic hypertrophy with medication refractory lower urinary symptoms.  PROCEDURE:  Transurethral resection of the prostate.  ESTIMATED BLOOD LOSS:  150 mL.  COMPLICATIONS:  None.  SPECIMEN:  Prostate chips were sent to pathology.  FINDINGS: 1.  Predominantly bilobar prostatic hypertrophy with kissing lobes pre-transurethral resection. 2.  Wide open fossa from the bladder neck to verumontanum following resection.  INDICATIONS FOR PROCEDURE:  [name] is a 67 year old male with longstanding history of obstructive and irritative voiding.  He has been on maximal medical therapy with 5 alpha reductase inhibitors and alpha blockers for a number of years and has been  progressively worsening.  He does have a history of some neurologic disease as well.  He underwent urodynamics, which corroborated obstructive picture with high pressure low flow and cystoscopy corroborated prostatic hypertrophy as a primary etiology.   Options were discussed including medical therapy versus outlet procedure. Given his prostate size between fifteen 100 grams, it was felt that a transurethral resection would offer the optimal benefit at this size range.  He presents for this today.   Informed consent was obtained and placed in medical record.  PROCEDURE DETAILS:  The patient being Naoki Huckeba identified and the procedure being transurethral resection of the prostate was confirmed.  Procedure timeout was performed.  Intravenous administered.  General LMA anesthesia induced, placed the  patient into a low lithotomy position.  Sterile field was created, prepped and draped the patient's penis, perineum, and proximal thighs using iodine.  Cystourethroscopy was performed  using a 26-French resectoscope sheath with visual obturator. After  calibration of the urethra to 28-French, inspection of the prostatic again corroborated bilobar prostatic hypertrophy, very small median lobe component.  Next, using resectoscope loop and saline with bipolar technique, transurethral resection was performed in  top down approach first at 12 o'clock position from the bladder neck to the verumontanum down to superficial fibromuscular stroma of the urinary bladder.  Then, the right lobe of the prostate, then the left lobe, taking exquisite care to avoid  undermining of the bladder neck and to avoid resection distal to the verumontanum.  This created numerous prostate chips, which were irrigated and set aside for permanent pathology.  This resulted in a wide open fossa from the verumontanum to the bladder  neck.  The entire base of the resection area was carefully fulgurated using coagulation current in a descending spiral type fashion.  Sensor wire was placed per urethra to the level of the urinary bladder, over which a 22-French 3-way Foley catheter was  placed silicone type over the wire to minimize any trauma to the bladder neck.  30 mL sterile water was placed and fixed to the leg strap traction and normal saline irrigation, efflux light pink.  Procedure was terminated.  The patient tolerated the procedure  well, no immediate perioperative complications.  The patient was taken to the postanesthesia care unit in stable condition.  Plan for observation admission.   MUK D: 03/19/2023 4:40:50 pm T: 03/19/2023 9:41:00 pm  JOB: [id number]/ [id number]

## 2023-03-19 NOTE — Discharge Instructions (Signed)
1 - You may have urinary urgency (bladder spasms) and bloody urine on / off with catheter in place. This is normal.  2 - Call MD or go to ER for fever >102, severe pain / nausea / vomiting not relieved by medications, or acute change in medical status

## 2023-03-20 DIAGNOSIS — N401 Enlarged prostate with lower urinary tract symptoms: Secondary | ICD-10-CM | POA: Diagnosis not present

## 2023-03-20 DIAGNOSIS — Z87891 Personal history of nicotine dependence: Secondary | ICD-10-CM | POA: Diagnosis not present

## 2023-03-20 DIAGNOSIS — R3915 Urgency of urination: Secondary | ICD-10-CM | POA: Diagnosis not present

## 2023-03-20 DIAGNOSIS — Z91040 Latex allergy status: Secondary | ICD-10-CM | POA: Diagnosis not present

## 2023-03-20 DIAGNOSIS — R339 Retention of urine, unspecified: Secondary | ICD-10-CM | POA: Diagnosis not present

## 2023-03-20 DIAGNOSIS — I2510 Atherosclerotic heart disease of native coronary artery without angina pectoris: Secondary | ICD-10-CM | POA: Diagnosis not present

## 2023-03-20 DIAGNOSIS — I10 Essential (primary) hypertension: Secondary | ICD-10-CM | POA: Diagnosis not present

## 2023-03-20 DIAGNOSIS — R3914 Feeling of incomplete bladder emptying: Secondary | ICD-10-CM | POA: Diagnosis not present

## 2023-03-20 DIAGNOSIS — R351 Nocturia: Secondary | ICD-10-CM | POA: Diagnosis not present

## 2023-03-20 LAB — HEMOGLOBIN AND HEMATOCRIT, BLOOD
HCT: 39.1 % (ref 39.0–52.0)
Hemoglobin: 12.7 g/dL — ABNORMAL LOW (ref 13.0–17.0)

## 2023-03-20 MED ORDER — CHLORHEXIDINE GLUCONATE CLOTH 2 % EX PADS
6.0000 | MEDICATED_PAD | Freq: Every day | CUTANEOUS | Status: DC
  Administered 2023-03-20: 6 via TOPICAL

## 2023-03-20 NOTE — Plan of Care (Signed)
  Problem: Education: Goal: Understanding of CV disease, CV risk reduction, and recovery process will improve Outcome: Adequate for Discharge Goal: Individualized Educational Video(s) Outcome: Adequate for Discharge   Problem: Activity: Goal: Ability to return to baseline activity level will improve Outcome: Adequate for Discharge   Problem: Cardiovascular: Goal: Ability to achieve and maintain adequate cardiovascular perfusion will improve Outcome: Adequate for Discharge Goal: Vascular access site(s) Level 0-1 will be maintained Outcome: Adequate for Discharge   Problem: Health Behavior/Discharge Planning: Goal: Ability to safely manage health-related needs after discharge will improve Outcome: Adequate for Discharge   Problem: Education: Goal: Knowledge of the prescribed therapeutic regimen will improve Outcome: Adequate for Discharge   Problem: Bowel/Gastric: Goal: Gastrointestinal status for postoperative course will improve Outcome: Adequate for Discharge   Problem: Health Behavior/Discharge Planning: Goal: Identification of resources available to assist in meeting health care needs will improve Outcome: Adequate for Discharge   Problem: Skin Integrity: Goal: Demonstration of wound healing without infection will improve Outcome: Adequate for Discharge   Problem: Urinary Elimination: Goal: Ability to avoid or minimize complications of infection will improve Outcome: Adequate for Discharge   Problem: Education: Goal: Knowledge of General Education information will improve Description: Including pain rating scale, medication(s)/side effects and non-pharmacologic comfort measures Outcome: Adequate for Discharge   Problem: Health Behavior/Discharge Planning: Goal: Ability to manage health-related needs will improve Outcome: Adequate for Discharge   Problem: Clinical Measurements: Goal: Ability to maintain clinical measurements within normal limits will  improve Outcome: Adequate for Discharge Goal: Will remain free from infection Outcome: Adequate for Discharge Goal: Diagnostic test results will improve Outcome: Adequate for Discharge Goal: Respiratory complications will improve Outcome: Adequate for Discharge Goal: Cardiovascular complication will be avoided Outcome: Adequate for Discharge   Problem: Activity: Goal: Risk for activity intolerance will decrease Outcome: Adequate for Discharge   Problem: Nutrition: Goal: Adequate nutrition will be maintained Outcome: Adequate for Discharge   Problem: Coping: Goal: Level of anxiety will decrease Outcome: Adequate for Discharge   Problem: Elimination: Goal: Will not experience complications related to bowel motility Outcome: Adequate for Discharge Goal: Will not experience complications related to urinary retention Outcome: Adequate for Discharge   Problem: Pain Managment: Goal: General experience of comfort will improve Outcome: Adequate for Discharge   Problem: Safety: Goal: Ability to remain free from injury will improve Outcome: Adequate for Discharge   Problem: Skin Integrity: Goal: Risk for impaired skin integrity will decrease Outcome: Adequate for Discharge

## 2023-03-20 NOTE — Discharge Summary (Signed)
Date of admission: 03/19/2023  Date of discharge: 03/20/2023  Admission diagnosis: BPH - with retention  Discharge diagnosis: same  Secondary diagnoses:  Patient Active Problem List   Diagnosis Date Noted   Prostate hypertrophy 03/19/2023   Abnormal nuclear stress test    Atypical angina    Panic disorder without agoraphobia 03/02/2022   Depression 03/02/2022   Fatigue 03/02/2022   Severe sleep apnea 02/19/2022   Severe obstructive sleep apnea-hypopnea syndrome 09/10/2021   Chronic intermittent hypoxia with obstructive sleep apnea 09/10/2021   Sleep apnea, obstructive 07/22/2021   Snoring 07/22/2021   Vitamin D deficiency 05/07/2021   Hypertension 01/31/2021   BPH (benign prostatic hyperplasia) 01/31/2021   Depression, recurrent (HCC) 01/31/2021   Hyperlipidemia 01/31/2021   CAD (coronary artery disease) 01/31/2021   Ascending aortic aneurysm (HCC) 01/31/2021   Upper airway cough syndrome 07/19/2015    Procedures performed: Procedure(s): TRANSURETHRAL RESECTION OF THE PROSTATE (TURP)  History and Physical: For full details, please see admission history and physical. Briefly, Eugene Guzman. is a 67 y.o. year old patient with obstructive voiding symptoms.   Hospital Course: Patient tolerated the procedure well.  He was then transferred to the floor after an uneventful PACU stay.  His hospital course was uncomplicated.  On POD#1 he had met discharge criteria: was eating a regular diet, was up and ambulating independently,  pain was well controlled,  and was ready to for discharge. Foley catheter was capped.  His urine remained clear.  He was ready to go home with the catheter.  EXam on day of discharge: NAD Vitals:   03/19/23 2008 03/20/23 0040 03/20/23 0523 03/20/23 0746  BP: 120/80 137/74 (!) 148/72 (!) 145/83  Pulse: 61 (!) 59 (!) 53 (!) 49  Resp: 20 16 16 18   Temp: 98 F (36.7 C) 98.4 F (36.9 C) 98.1 F (36.7 C) 98.2 F (36.8 C)  TempSrc: Oral Oral Oral  Oral  SpO2: 98% 99% 96% 97%  Weight:      Height:        Intake/Output Summary (Last 24 hours) at 03/20/2023 0952 Last data filed at 03/20/2023 0700 Gross per 24 hour  Intake 9000.02 ml  Output 7610 ml  Net 1390.02 ml   Abdomen is soft Foley is clear Ext symmetric   Laboratory values:  Recent Labs    03/20/23 0500  HGB 12.7*  HCT 39.1   No results for input(s): "NA", "K", "CL", "CO2", "GLUCOSE", "BUN", "CREATININE", "CALCIUM" in the last 72 hours. No results for input(s): "LABPT", "INR" in the last 72 hours. No results for input(s): "LABURIN" in the last 72 hours. No results found for this or any previous visit.  Disposition: Home  Discharge instruction: The patient was instructed to be ambulatory but told to refrain from heavy lifting, strenuous activity, or driving.   Discharge medications:  Allergies as of 03/20/2023       Reactions   Levofloxacin    Avoid fluoroquinolone antibiotics due to thoracic aortic aneurysm   Penicillins Other (See Comments)   Childhood   Latex Rash        Medication List     TAKE these medications    amLODipine 2.5 MG tablet Commonly known as: NORVASC Take 1 tablet (2.5 mg total) by mouth daily.   aspirin EC 81 MG tablet Take 1 tablet (81 mg total) by mouth daily. Swallow whole. What changed: when to take this   b complex vitamins capsule Take 1 capsule by mouth daily.  bismuth subsalicylate 262 MG/15ML suspension Commonly known as: PEPTO BISMOL Take 30 mLs by mouth every 6 (six) hours as needed for indigestion or diarrhea or loose stools.   clonazePAM 0.5 MG tablet Commonly known as: KLONOPIN Take 0.5 mg by mouth 4 (four) times daily as needed for anxiety.   ezetimibe 10 MG tablet Commonly known as: ZETIA Take 1 tablet (10 mg total) by mouth daily. What changed: when to take this   finasteride 5 MG tablet Commonly known as: PROSCAR Take 5 mg by mouth at bedtime.   fluvoxaMINE 100 MG tablet Commonly known as:  LUVOX Take 200 mg by mouth at bedtime.   lamoTRIgine 200 MG tablet Commonly known as: LAMICTAL Take 200 mg by mouth at bedtime.   multivitamin Tabs tablet Take 1 tablet by mouth at bedtime.   oxybutynin 5 MG tablet Commonly known as: DITROPAN Take 1 tablet (5 mg total) by mouth every 8 (eight) hours as needed for bladder spasms. With catheter in place   oxyCODONE-acetaminophen 5-325 MG tablet Commonly known as: Percocet Take 1 tablet by mouth every 6 (six) hours as needed for severe pain or moderate pain (post-operatively).   PreviDent 5000 Booster Plus 1.1 % Pste Generic drug: Sodium Fluoride Place 1 Application onto teeth daily.   rosuvastatin 20 MG tablet Commonly known as: CRESTOR Take 1 tablet (20 mg total) by mouth daily. What changed: when to take this   senna-docusate 8.6-50 MG tablet Commonly known as: Senokot-S Take 1 tablet by mouth 2 (two) times daily. While taking strong pain meds to prevent constipation   sildenafil 50 MG tablet Commonly known as: VIAGRA Take 1 tablet (50 mg total) by mouth daily as needed for erectile dysfunction (Use 1 hour before sexual activity.).   sulfamethoxazole-trimethoprim 800-160 MG tablet Commonly known as: BACTRIM DS Take 1 tablet by mouth 2 (two) times daily. X 3 days. Begin day before next Urology appointment.   tamsulosin 0.4 MG Caps capsule Commonly known as: FLOMAX Take 0.4 mg by mouth at bedtime.   Wellbutrin XL 150 MG 24 hr tablet Generic drug: buPROPion Take 150 mg by mouth every other day.        Followup:   Follow-up Information     [name]., MD Follow up on 03/23/2023.   Specialty: Urology Why: at 9:15 for MD visit and office catheter removal Contact information: [address] [place] [address] [phone]

## 2023-03-20 NOTE — Anesthesia Postprocedure Evaluation (Signed)
Anesthesia Post Note  Patient: Eugene Guzman.  Procedure(s) Performed: TRANSURETHRAL RESECTION OF THE PROSTATE (TURP) (Penis)     Patient location during evaluation: PACU Anesthesia Type: General Level of consciousness: awake and alert Pain management: pain level controlled Vital Signs Assessment: post-procedure vital signs reviewed and stable Respiratory status: spontaneous breathing, nonlabored ventilation, respiratory function stable and patient connected to nasal cannula oxygen Cardiovascular status: blood pressure returned to baseline and stable Postop Assessment: no apparent nausea or vomiting Anesthetic complications: no   No notable events documented.  Last Vitals:  Vitals:   03/20/23 0523 03/20/23 0746  BP: (!) 148/72 (!) 145/83  Pulse: (!) 53 (!) 49  Resp: 16 18  Temp: 36.7 C 36.8 C  SpO2: 96% 97%    Last Pain:  Vitals:   03/20/23 0746  TempSrc: Oral  PainSc: 0-No pain                 Trevor Iha

## 2023-03-20 NOTE — Plan of Care (Signed)
  Problem: Safety: Goal: Ability to remain free from injury will improve Outcome: Progressing   Problem: Pain Managment: Goal: General experience of comfort will improve Outcome: Progressing   Problem: Skin Integrity: Goal: Risk for impaired skin integrity will decrease Outcome: Progressing   Problem: Coping: Goal: Level of anxiety will decrease Outcome: Progressing   Problem: Clinical Measurements: Goal: Ability to maintain clinical measurements within normal limits will improve Outcome: Progressing

## 2023-03-21 ENCOUNTER — Encounter (HOSPITAL_COMMUNITY): Payer: Self-pay | Admitting: Urology

## 2023-03-23 ENCOUNTER — Other Ambulatory Visit: Payer: Medicare HMO | Admitting: Pharmacist

## 2023-03-23 DIAGNOSIS — R3915 Urgency of urination: Secondary | ICD-10-CM | POA: Diagnosis not present

## 2023-03-23 LAB — SURGICAL PATHOLOGY

## 2023-03-23 NOTE — Progress Notes (Signed)
03/23/2023 Name: Eugene Guzman [facility]. MRN: [id number] DOB: 1956/04/15  Chief Complaint  Patient presents with   Medication Management    Newton Hajj Friddie Clowes. is a 67 y.o. year old male who presented for a telephone visit. Mr. Roupe wife was also present during phone visit today.   They were referred to the pharmacist by their PCP for assistance in managing complex medication management.    Subjective:  Care Team: Primary Care Provider: [name], MD ; Next Scheduled Visit: 09/08/2023 Neurologist: Dr Vickey Huger; Next Scheduled Visit: patient to make follow up Oncologist / Hematologist: Dr Candise Che; Next Scheduled Visit: 05/26/2023 Urologist: Dr Berneice Heinrich Cardiologist: Dr Tenny Craw; Next Scheduled Visit 06/15/2023 Psychiatry: Ellis Savage, NP Counselor: Dr Dellia Cloud  Medication Access/Adherence  Current Pharmacy:  [id number] - [place], [place] - [address] [place] [address] Phone: [phone] Fax: [phone]  [facility] - [place], [place] - [address] [place] [address] Phone: [phone] Fax: [phone]   Patient reports affordability concerns with their medications: No  Patient reports access/transportation concerns to their pharmacy: No  Patient reports adherence concerns with their medications:  No    Patient would like review of medications for possible interactions. He has been experiencing fatigue and loss of balance and would also like to see if any medications are possibly causing these symptoms.   Yesterday day several medications were stopped by urologist. Patient had TRANSURETHRAL RESECTION OF THE PROSTATE (TURP) earlier in September. Tamsulosin, oxycodone/acetaminophen, Sennokot and oxybutynin were stopped at appointment with urologist, Dr Berneice Heinrich.   Patient has had stressful year due to declining health of his mother and her death in 02/20/23. He reports that it is hard to determine if symptoms like  difficulty sleeping, fatigue are related to depression or possibly medications.    Objective:  Lab Results  Component Value Date   HGBA1C 5.3 11/09/2022    Lab Results  Component Value Date   CREATININE 0.76 01/20/2023   BUN 11 01/20/2023   NA 136 01/20/2023   K 4.2 01/20/2023   CL 100 01/20/2023   CO2 21 01/20/2023    Lab Results  Component Value Date   CHOL 157 01/20/2023   HDL 110.50 01/20/2023   LDLCALC 37 01/20/2023   TRIG 47.0 01/20/2023   CHOLHDL 1 01/20/2023    Medications Reviewed Today     Reviewed by Henrene Pastor, RPH-CPP (Pharmacist) on 03/23/23 at 1341  Med List Status: <None>   Medication Order Taking? Sig Documenting Provider Last Dose Status Informant  amLODipine (NORVASC) 2.5 MG tablet [id number] Yes Take 1 tablet (2.5 mg total) by mouth daily. Pricilla Riffle, MD Taking Active   aspirin EC 81 MG tablet [id number] Yes Take 1 tablet (81 mg total) by mouth daily. Swallow whole.  Patient taking differently: Take 81 mg by mouth at bedtime. Swallow whole.   Pricilla Riffle, MD Taking Active Spouse/Significant Other  b complex vitamins capsule [id number] Yes Take 1 capsule by mouth daily. [provider] Taking Active Spouse/Significant Other  bismuth subsalicylate (PEPTO BISMOL) 262 MG/15ML suspension [id number] Yes Take 30 mLs by mouth every 6 (six) hours as needed for indigestion or diarrhea or loose stools. [provider] Taking Active Spouse/Significant Other  Cholecalciferol (VITAMIN D3) 50 MCG (2000 UT) CAPS [id number] Yes Take 1 capsule by mouth daily. [provider] Taking Active   clonazePAM (KLONOPIN) 0.5 MG tablet [id number] Yes Take 0.5 mg by  mouth 4 (four) times daily as needed for anxiety. [provider] Taking Active Spouse/Significant Other  ezetimibe (ZETIA) 10 MG tablet [id number] Yes Take 1 tablet (10 mg total) by mouth daily.  Patient taking differently: Take 10 mg by mouth at bedtime.   Pricilla Riffle, MD Taking  Active Spouse/Significant Other  finasteride (PROSCAR) 5 MG tablet [id number] Yes Take 5 mg by mouth at bedtime. [provider] Taking Active Spouse/Significant Other  fluvoxaMINE (LUVOX) 100 MG tablet [id number] Yes Take 200 mg by mouth at bedtime. [provider] Taking Active Spouse/Significant Other  lamoTRIgine (LAMICTAL) 200 MG tablet [id number] Yes Take 200 mg by mouth at bedtime. [provider] Taking Active Spouse/Significant Other  multivitamin (ONE-A-DAY MEN'S) TABS tablet [id number] Yes Take 1 tablet by mouth at bedtime. [provider] Taking Active Spouse/Significant Other  PREVIDENT 5000 BOOSTER PLUS 1.1 % PSTE [id number] No Place 1 Application onto teeth daily.  Patient not taking: Reported on 03/23/2023   [provider] Not Taking Active Spouse/Significant Other           Med Note Clydie Braun, Jaclyn Carew B   Tue Mar 23, 2023  1:29 PM)    rosuvastatin (CRESTOR) 20 MG tablet [id number] Yes Take 1 tablet (20 mg total) by mouth daily.  Patient taking differently: Take 20 mg by mouth at bedtime.   Pricilla Riffle, MD Taking Active Spouse/Significant Other  sildenafil (VIAGRA) 50 MG tablet [id number] Yes Take 1 tablet (50 mg total) by mouth daily as needed for erectile dysfunction (Use 1 hour before sexual activity.). Pricilla Riffle, MD Taking Active Spouse/Significant Other  sulfamethoxazole-trimethoprim (BACTRIM DS) 800-160 MG tablet [id number] Yes Take 1 tablet by mouth 2 (two) times daily. X 3 days. Begin day before next Urology appointment. [name]., MD Taking Active   [facility] XL 150 MG 24 hr tablet [id number] Yes Take 150 mg by mouth every other day. [provider] Taking Active Spouse/Significant Other              Assessment/Plan:   Medication Management: - Reviewed medication list and assess for drug-drug interactions. Below are the drug-drug interaction found. However since patient has stopped both oxycodone and  tamsulosin there are no significant interactions identified.   Oxycodone + clonazepam = increase risk of CNS depression, decreased in cognitive function Tamsulosin + Wellbutrin = Wellbutrin is a CPY2D6 inhibitor which can lead to increase levels of tamsulosin. Higher levels of tamsulosin could decrease blood pressure or increase risk of dizziness / unsteadiness.  - Recommended change Wellbutrin XL 150mg  to take each morning. This might help with daytime fatigue and decrease difficulty sleeping.  - Change to take ezetimibe each morning  - Updated medication list.  - Recommend physical activity that Dr Ruthine Dose has suggested at last visit - leg lifts,etc - Discussed increase risk of sedating side effects of alcohol use with clonazepam.  - Encouraged continued use of B Complex due to B12 level on lower end of normal. Consider rechecking B12 with next labs - Encourage continue use of vitamin D - last serum vitamin D was WNL.    Follow Up Plan: 2 months   Henrene Pastor, PharmD Clinical Pharmacist [facility] Primary [facility]  Population Health [phone]

## 2023-03-24 ENCOUNTER — Other Ambulatory Visit: Payer: Medicare HMO

## 2023-03-24 ENCOUNTER — Ambulatory Visit: Payer: Medicare HMO | Admitting: Hematology

## 2023-03-24 ENCOUNTER — Ambulatory Visit: Payer: Medicare HMO | Admitting: Psychology

## 2023-03-31 ENCOUNTER — Ambulatory Visit: Payer: Medicare HMO | Admitting: Psychology

## 2023-04-07 ENCOUNTER — Ambulatory Visit: Payer: Medicare HMO | Admitting: Psychology

## 2023-04-07 DIAGNOSIS — C9110 Chronic lymphocytic leukemia of B-cell type not having achieved remission: Secondary | ICD-10-CM | POA: Diagnosis not present

## 2023-04-21 ENCOUNTER — Telehealth: Payer: Self-pay | Admitting: Family Medicine

## 2023-04-21 ENCOUNTER — Ambulatory Visit: Payer: Medicare HMO | Admitting: Gastroenterology

## 2023-04-21 ENCOUNTER — Encounter: Payer: Self-pay | Admitting: Gastroenterology

## 2023-04-21 ENCOUNTER — Other Ambulatory Visit (INDEPENDENT_AMBULATORY_CARE_PROVIDER_SITE_OTHER): Payer: Medicare HMO

## 2023-04-21 VITALS — BP 130/70 | HR 64 | Ht 70.0 in | Wt 172.0 lb

## 2023-04-21 DIAGNOSIS — R7401 Elevation of levels of liver transaminase levels: Secondary | ICD-10-CM

## 2023-04-21 DIAGNOSIS — Z789 Other specified health status: Secondary | ICD-10-CM

## 2023-04-21 DIAGNOSIS — Z860100 Personal history of colon polyps, unspecified: Secondary | ICD-10-CM

## 2023-04-21 LAB — IBC + FERRITIN
Ferritin: 208.9 ng/mL (ref 22.0–322.0)
Iron: 73 ug/dL (ref 42–165)
Saturation Ratios: 17.4 % — ABNORMAL LOW (ref 20.0–50.0)
TIBC: 420 ug/dL (ref 250.0–450.0)
Transferrin: 300 mg/dL (ref 212.0–360.0)

## 2023-04-21 LAB — HEPATIC FUNCTION PANEL
ALT: 49 U/L (ref 0–53)
AST: 49 U/L — ABNORMAL HIGH (ref 0–37)
Albumin: 4.7 g/dL (ref 3.5–5.2)
Alkaline Phosphatase: 89 U/L (ref 39–117)
Bilirubin, Direct: 0.1 mg/dL (ref 0.0–0.3)
Total Bilirubin: 0.5 mg/dL (ref 0.2–1.2)
Total Protein: 7.6 g/dL (ref 6.0–8.3)

## 2023-04-21 MED ORDER — NA SULFATE-K SULFATE-MG SULF 17.5-3.13-1.6 GM/177ML PO SOLN: 1.0000 | Freq: Once | ORAL | 0 refills | Status: AC

## 2023-04-21 NOTE — Telephone Encounter (Signed)
Pt's wife called to advise that patient has a colonoscopy on day of the appt with her so we need to reschedule. Appt cancelled. Please call to reschedule.

## 2023-04-21 NOTE — Patient Instructions (Addendum)
You have been scheduled for a colonoscopy. Please follow written instructions given to you at your visit today.   Please pick up your prep supplies at the pharmacy within the next 1-3 days.  If you use inhalers (even only as needed), please bring them with you on the day of your procedure.  DO NOT TAKE 7 DAYS PRIOR TO TEST- Trulicity (dulaglutide) Ozempic, Wegovy (semaglutide) Mounjaro (tirzepatide) Bydureon Bcise (exanatide extended release)  DO NOT TAKE 1 DAY PRIOR TO YOUR TEST Rybelsus (semaglutide) Adlyxin (lixisenatide) Victoza (liraglutide) Byetta (exanatide) _______________________________________________________________________  Take Miralax daily.  Make sure you are taking for the week leading up to the procedure to ensure you have a good prep.  Please go to the lab in the basement of our building to have lab work done as you leave today. Hit "B" for basement when you get on the elevator.  When the doors open the lab is on your left.  We will call you with the results. Thank you.  Stop alcohol.  You will be contacted by [facility] Scheduling in the next 2 days to arrange a liver ultrasound.  The number on your caller ID will be [phone], please answer when they call.  If you have not heard from them in 2 days please call [phone] to schedule.     You will be due for labs in 3 months.  We will remind you when it is time to come to the lab.  Thank you for entrusting me with your care and for choosing [facility], Dr. Ileene Patrick

## 2023-04-21 NOTE — Progress Notes (Signed)
HPI :  67 year old male with a history of CLL (followed by oncology, no treatment currently), colon polyps, elevated liver enzymes, here to establish care for history of colon polyps and further evaluation of elevated liver enzymes.  Primary care is [name], MD  Records reviewed from prior GI workup with Dr. Kinnie Scales.  He had a colonoscopy in 2019 where he had 5 polyps removed, most adenomas.  He was told to repeat in 5 years.  He denies any diarrhea.  He has occasional constipation that bothers him.  No blood in his stools.  No abdominal pains routinely.  His paternal grandfather had colon cancer.  No other colon cancer in the family.  He has a history of CLL that was diagnosed earlier this year, followed by oncology and observing for now, no immediate plans for chemotherapy currently.  He denies any cardiopulmonary symptoms.  He recently underwent a TURP procedure with urology, states no issues with anesthesia with that.  He states he is having some urinary incontinence and bleeding from the surgery still and is recovering from that.  Otherwise he has had some elevated liver enzymes dating back to July of this year.  Mild ALT and AST elevation into the 50s that has been persistent in recent months.  He denies any known personal history of liver disease.  No family history of liver disease.  Most recently had labs drawn on October 2, AST 56, ALT 51, T. bili 1.0, alk phos 93.  He had an ultrasound in January 2022 of his right upper quadrant which did not show any concerning findings.  He has had testing for hepatitis B and C and negative, not immune to hep B.  He has had a prior ANA and recent past which was negative.  He does endorse drinking 3-4 beers per day for many years.  He states his mother was ill this past summer and he drank more than typical.  He denies any new medications over the past several months.  Otherwise feels well today without any complaints  Outside PET July 2024  without abnormalities in liver. AST (Aspartate Aminotransferase)  Date Value Ref Range Status  04/07/2023 56 (H) 15 - 41 U/L Final  02/24/2023 47 (H) 15 - 41 U/L Final   ALT (Alanine Aminotransferase)  Date Value Ref Range Status  04/07/2023 51 (H) 15 - 50 U/L Final  02/24/2023 56 (H) 15 - 50 U/L Final   PET scan 11/06/22: IMPRESSION: 1. No significant metabolic activity associated with enlarged RIGHT level 1 B lymph node. Favor a benign reactive node. 2. Symmetric metabolic activity at the base of tongue is favored physiologic. 3. No evidence of lymphoma on whole-body FDG PET scan.  Cardiac cath 04/28/22: POST-OP DIAGNOSIS Correct findings on stress PET: No obstructive disease Moderate multivessel CAD, calcified: Mid LAD 60% (RFR 0.94-not significant, apical LAD RFR was still not significant-0.92.), ostial LCx 55%, mid RCA 55%. Normal LV function, normal LVEDP RECOMMENDATIONS Continue to titrate medications for cardiac risk factor reduction Consider increasing calcium channel blocker for likely microvascular disease based on stress PET results.   Colonoscopy 02/28/2018 - Dr. Kinnie Scales - "5 polyps" 3-6 mm in size, diverticulosis - adenomas and hyperplastic polyps - can't determine how much of each  Colonoscopy 12/2012 - Dr. Kinnie Scales - sigmoid 9mm polyp, hemorrhoids - adenoma   RUQ [place] 07/19/20: No focal liver lesions, normal echogenicity  Negative hep B and hep C studies in April 2024 ANA negative 02/2022   Past Medical  History:  Diagnosis Date   Allergy    childhood - penicillin, 2014 - latex   Anxiety    Aortic aneurysm (HCC)    Arthritis 2014   right shoulder   CLL (chronic lymphocytic leukemia) (HCC)    Coronary artery calcification seen on CT scan    Depressed    Hx of adenomatous polyp of colon 02/2018   Dr. Lianne Moris   Hyperlipidemia    Hypertension    Sleep apnea 2022   Have stopped using CPAP     Past Surgical History:  Procedure Laterality Date   CORONARY  PRESSURE/FFR STUDY N/A 04/28/2022   Procedure: INTRAVASCULAR PRESSURE WIRE/FFR STUDY;  Surgeon: [name], MD;  Location: [facility] INVASIVE CV LAB;  Service: Cardiovascular;  Laterality: N/A;   LEFT HEART CATH AND CORONARY ANGIOGRAPHY N/A 04/28/2022   Procedure: LEFT HEART CATH AND CORONARY ANGIOGRAPHY;  Surgeon: [name], MD;  Location: [facility] INVASIVE CV LAB;  Service: Cardiovascular;  Laterality: N/A;   TONSILLECTOMY  1961   TRANSURETHRAL RESECTION OF PROSTATE N/A 03/19/2023   Procedure: TRANSURETHRAL RESECTION OF THE PROSTATE (TURP);  Surgeon: [name]., MD;  Location: [facility] ORS;  Service: Urology;  Laterality: N/A;   Family History  Problem Relation Age of Onset   Hypertension Mother    Cancer Mother    Hearing loss Mother    Heart attack Father 37   Arthritis Father    Depression Father    Heart disease Father    Depression Paternal Uncle    Depression Paternal Uncle    Ovarian cancer Maternal Grandmother    Cancer Maternal Grandmother    Hearing loss Maternal Grandfather    Heart disease Maternal Grandfather    Hypertension Maternal Grandfather    Stroke Maternal Grandfather    Colon cancer Paternal Grandfather 47   Cancer Paternal Grandfather    Social History   Tobacco Use   Smoking status: Former    Current packs/day: 0.00    Average packs/day: 0.3 packs/day for 30.0 years (7.5 ttl pk-yrs)    Types: Cigarettes    Quit date: 01/04/2015    Years since quitting: 8.2    Passive exposure: Never   Smokeless tobacco: Never  Vaping Use   Vaping status: Never Used  Substance Use Topics   Alcohol use: Yes    Alcohol/week: 12.0 standard drinks of alcohol    Types: 12 Cans of beer per week    Comment: weekly   Drug use: No   Current Outpatient Medications  Medication Sig Dispense Refill   amLODipine (NORVASC) 2.5 MG tablet Take 1 tablet (2.5 mg total) by mouth daily. 90 tablet 3   b complex vitamins capsule Take 1 capsule by mouth daily.     clonazePAM  (KLONOPIN) 0.5 MG tablet Take 0.5 mg by mouth 4 (four) times daily as needed for anxiety.     ezetimibe (ZETIA) 10 MG tablet Take 1 tablet (10 mg total) by mouth daily. (Patient taking differently: Take 10 mg by mouth at bedtime.) 90 tablet 3   finasteride (PROSCAR) 5 MG tablet Take 5 mg by mouth at bedtime.     fluvoxaMINE (LUVOX) 100 MG tablet Take 200 mg by mouth at bedtime.     lamoTRIgine (LAMICTAL) 200 MG tablet Take 200 mg by mouth at bedtime.     rosuvastatin (CRESTOR) 20 MG tablet Take 1 tablet (20 mg total) by mouth daily. (Patient taking differently: Take 20 mg by mouth at bedtime.) 90 tablet 3  WELLBUTRIN XL 150 MG 24 hr tablet Take 150 mg by mouth every other day.     aspirin EC 81 MG tablet Take 1 tablet (81 mg total) by mouth daily. Swallow whole. (Patient not taking: Reported on 04/21/2023) 90 tablet 3   bismuth subsalicylate (PEPTO BISMOL) 262 MG/15ML suspension Take 30 mLs by mouth every 6 (six) hours as needed for indigestion or diarrhea or loose stools. (Patient not taking: Reported on 04/21/2023)     Cholecalciferol (VITAMIN D3) 50 MCG (2000 UT) CAPS Take 1 capsule by mouth daily. (Patient not taking: Reported on 04/21/2023)     multivitamin (ONE-A-DAY MEN'S) TABS tablet Take 1 tablet by mouth at bedtime. (Patient not taking: Reported on 04/21/2023)     PREVIDENT 5000 BOOSTER PLUS 1.1 % PSTE Place 1 Application onto teeth daily. (Patient not taking: Reported on 03/23/2023)     sildenafil (VIAGRA) 50 MG tablet Take 1 tablet (50 mg total) by mouth daily as needed for erectile dysfunction (Use 1 hour before sexual activity.). (Patient not taking: Reported on 04/21/2023) 30 tablet 2   sulfamethoxazole-trimethoprim (BACTRIM DS) 800-160 MG tablet Take 1 tablet by mouth 2 (two) times daily. X 3 days. Begin day before next Urology appointment. 6 tablet 0   No current facility-administered medications for this visit.   Allergies  Allergen Reactions   Levofloxacin     Avoid  fluoroquinolone antibiotics due to thoracic aortic aneurysm   Penicillins Other (See Comments)    Childhood   Latex Rash     Review of Systems: All systems reviewed and negative except where noted in HPI.   Lab Results  Component Value Date   WBC 15.6 (H) 01/20/2023   HGB 12.7 (L) 03/20/2023   HCT 39.1 03/20/2023   MCV 94.6 01/20/2023   PLT 160.0 01/20/2023    Lab Results  Component Value Date   NA 136 01/20/2023   CL 100 01/20/2023   K 4.2 01/20/2023   CO2 21 01/20/2023   BUN 11 01/20/2023   CREATININE 0.76 01/20/2023   GFR 91.89 01/20/2023   CALCIUM 9.3 01/20/2023   ALBUMIN 4.5 01/20/2023   GLUCOSE 85 01/20/2023    Lab Results  Component Value Date   ALT 43 01/20/2023   AST 51 (H) 01/20/2023   GGT 99 (H) 02/28/2020   ALKPHOS 73 01/20/2023   BILITOT 0.9 01/20/2023    Physical Exam: BP 130/70   Pulse 64   Ht 5\' 10"  (1.778 m)   Wt 172 lb (78 kg)   BMI 24.68 kg/m  Constitutional: Pleasant,well-developed, male in no acute distress. HEENT: Normocephalic and atraumatic. Conjunctivae are normal. No scleral icterus. Neck supple.  Cardiovascular: Normal rate, regular rhythm.  Pulmonary/chest: Effort normal and breath sounds normal. Abdominal: Soft, nondistended, nontender. There are no masses palpable. No hepatomegaly. Extremities: no edema Lymphadenopathy: No cervical adenopathy noted. Neurological: Alert and oriented to person place and time. Skin: Skin is warm and dry. No rashes noted. Psychiatric: Normal mood and affect. Behavior is normal.   ASSESSMENT: 67 y.o. male here for assessment of the following  1. History of colon polyps   2. Transaminitis   3. Alcohol use    He is due for routine surveillance colonoscopy for history of colon polyps.  Has some mild constipation but no significant bowel changes.  He did well with anesthesia recently for his TURP procedure.  Recommend optical colonoscopy for history of polyps.  Discussed risks and benefits and  he wants to proceed.  Recommend MiraLAX daily  in the week preceding his exam to help with his prep and avoid constipation. He is agreeable.  Otherwise patient with transaminitis that is mild since this past summer.  Historically has had relatively normal liver function testing.  AST greater than ALT on recent check.  Initial serologic workup was negative for hepatitis B and C, ANA negative.  He drinks alcohol routinely, suspect he may have alcohol related transaminitis.  We discussed this and risks for fibrosis/cirrhosis over time.  Recommend he minimize alcohol use and abstain if possible.  He understands and agrees to do this.  If he has any symptoms of withdrawal he should let us know so we can help manage that.  Otherwise we will send his some additional serologies to rule out other causes of chronic liver disease, he agrees.  No new medication changes that would correlate with the liver enzyme elevation.  Otherwise recommend interval right upper quadrant ultrasound given it has been almost 3 years since his last exam, assess for steatosis and ensure no evidence of cirrhosis.  He agrees.  Plan on repeating liver enzymes in 3 months to trend, while off alcohol.  PLAN: - schedule colonoscopy at the [facility] - will do November / December time frame - recovering from prostate surgery - told to take Miralax daily in the week preceeding prep - lab today - rule out other cause of chronic liver disease, check immunity to hep A and vaccinate if needed. He will also need hep B vaccine. - RUQ [place] - rule out cirrhosis, fatty liver - counseled on EtOH use - recommend minimize use - repeat LFTs in 3 months to trend, while off alcohol  Harlin Rain, MD [facility]  CC: [name], MD

## 2023-04-22 ENCOUNTER — Telehealth: Payer: Self-pay

## 2023-04-22 ENCOUNTER — Ambulatory Visit (INDEPENDENT_AMBULATORY_CARE_PROVIDER_SITE_OTHER): Payer: Medicare HMO | Admitting: Psychology

## 2023-04-22 DIAGNOSIS — F339 Major depressive disorder, recurrent, unspecified: Secondary | ICD-10-CM

## 2023-04-22 DIAGNOSIS — F410 Panic disorder [episodic paroxysmal anxiety] without agoraphobia: Secondary | ICD-10-CM

## 2023-04-22 DIAGNOSIS — F4321 Adjustment disorder with depressed mood: Secondary | ICD-10-CM | POA: Diagnosis not present

## 2023-04-22 DIAGNOSIS — F331 Major depressive disorder, recurrent, moderate: Secondary | ICD-10-CM

## 2023-04-22 NOTE — Progress Notes (Signed)
Care Coordination Note  04/22/2023 Name: Eugene Guzman [facility]. MRN: [id number] DOB: Mar 05, 1956  [name] Herzfeld. is a 67 y.o. year old male who is a primary care patient of [name], MD and is actively engaged with the Chronic Care Management team. I reached out to [facility]. by phone today to assist with re-scheduling a follow up visit with the Pharmacist  Follow up plan: Unsuccessful telephone outreach attempt made. A HIPAA compliant phone message was left for the patient providing contact information and requesting a return call.   Penne Lash, RMA Care Guide [facility]  [place], [place] [address] Direct Dial: [phone] [email or web address]

## 2023-04-22 NOTE — Progress Notes (Signed)
[facility] Counselor Initial Adult Exam  Name: Eugene Guzman. Date: 04/22/2023 MRN: [id number] DOB: 02-Mar-1956 PCP: [name], MD  Time spent: 60 minutes  Guardian/Payee:  N/A    Paperwork requested: No   Reason for Visit /Presenting Problem: Symptoms of Depression, Grief and Panic   Mental Status Exam: Appearance:   Casual     Behavior:  Appropriate  Motor:  Normal  Speech/Language:   Clear and Coherent  Affect:  Appropriate  Mood:  depressed  Thought process:  normal  Thought content:    WNL  Sensory/Perceptual disturbances:    WNL  Orientation:  oriented to person, place, and situation  Attention:  Good  Concentration:  Good  Memory:  WNL  Fund of knowledge:   Good  Insight:    Good  Judgment:   Good  Impulse Control:  Good    Reported Symptoms:  sadness, powerlessness, anxiety/panic  Risk Assessment: Danger to Self:  No Self-injurious Behavior: No Danger to Others: No Duty to Warn:no Physical Aggression / Violence:No  Access to Firearms a concern: No  Gang Involvement:No  Patient / guardian was educated about steps to take if suicide or homicide risk level increases between visits: n/a While future psychiatric events cannot be accurately predicted, the patient does not currently require acute inpatient psychiatric care and does not currently meet [facility] involuntary commitment criteria.  Substance Abuse History: Current substance abuse: No     Past Psychiatric History:   Previous psychological history is significant for depression and panic Outpatient Providers:Lisa Polis History of Psych Hospitalization: No  Psychological Testing:  N/A    Abuse History:  Victim of: No.,  N/A    Report needed: No. Victim of Neglect:No. Perpetrator of  N/A   Witness / Exposure to Domestic Violence: No   Protective Services Involvement: No  Witness to [employer]  Violence:  No   Family History:  Family History  Problem Relation Age of Onset   Hypertension Mother    Cancer Mother    Hearing loss Mother    Heart attack Father 72   Arthritis Father    Depression Father    Heart disease Father    Depression Paternal Uncle    Depression Paternal Uncle    Ovarian cancer Maternal Grandmother    Cancer Maternal Grandmother    Hearing loss Maternal Grandfather    Heart disease Maternal Grandfather    Hypertension Maternal Grandfather    Stroke Maternal Grandfather    Colon cancer Paternal Grandfather 27   Cancer Paternal Grandfather     Living situation: the patient lives with their spouse  Sexual Orientation: Straight  Relationship Status: married  Name of spouse / other:unknown If a parent, number of children / ages:none  Support Systems: N/A  Surveyor, quantity Stress:  No   Income/Employment/Disability: Social Herbalist:  unknown  Educational History: Education: Risk manager: unknown  Any cultural differences that may affect / interfere with treatment:  not applicable   Recreation/Hobbies: unknown  Stressors: Loss of mother    Strengths: Supportive Relationships and Able to Communicate Effectively  Barriers:  unknown   Legal History: Pending legal issue / charges: The patient has no significant history of legal issues. History of legal issue / charges:  N/A  Medical  History/Surgical History: reviewed Past Medical History:  Diagnosis Date   Allergy    childhood - penicillin, 2014 - latex   Anxiety    Aortic aneurysm (HCC)    Arthritis 2014   right shoulder   CLL (chronic lymphocytic leukemia) (HCC)    Coronary artery calcification seen on CT scan    Depressed    Hx of adenomatous polyp of colon 02/2018   Dr. Lianne Moris   Hyperlipidemia    Hypertension    Sleep apnea 2022   Have stopped using CPAP    Past Surgical History:  Procedure Laterality Date    CORONARY PRESSURE/FFR STUDY N/A 04/28/2022   Procedure: INTRAVASCULAR PRESSURE WIRE/FFR STUDY;  Surgeon: [name], MD;  Location: [facility] INVASIVE CV LAB;  Service: Cardiovascular;  Laterality: N/A;   LEFT HEART CATH AND CORONARY ANGIOGRAPHY N/A 04/28/2022   Procedure: LEFT HEART CATH AND CORONARY ANGIOGRAPHY;  Surgeon: [name], MD;  Location: [facility] INVASIVE CV LAB;  Service: Cardiovascular;  Laterality: N/A;   TONSILLECTOMY  1961   TRANSURETHRAL RESECTION OF PROSTATE N/A 03/19/2023   Procedure: TRANSURETHRAL RESECTION OF THE PROSTATE (TURP);  Surgeon: [name]., MD;  Location: [facility] ORS;  Service: Urology;  Laterality: N/A;    Medications: Current Outpatient Medications  Medication Sig Dispense Refill   amLODipine (NORVASC) 2.5 MG tablet Take 1 tablet (2.5 mg total) by mouth daily. 90 tablet 3   aspirin EC 81 MG tablet Take 1 tablet (81 mg total) by mouth daily. Swallow whole. (Patient not taking: Reported on 04/21/2023) 90 tablet 3   b complex vitamins capsule Take 1 capsule by mouth daily.     bismuth subsalicylate (PEPTO BISMOL) 262 MG/15ML suspension Take 30 mLs by mouth every 6 (six) hours as needed for indigestion or diarrhea or loose stools. (Patient not taking: Reported on 04/21/2023)     Cholecalciferol (VITAMIN D3) 50 MCG (2000 UT) CAPS Take 1 capsule by mouth daily. (Patient not taking: Reported on 04/21/2023)     clonazePAM (KLONOPIN) 0.5 MG tablet Take 0.5 mg by mouth 4 (four) times daily as needed for anxiety.     ezetimibe (ZETIA) 10 MG tablet Take 1 tablet (10 mg total) by mouth daily. (Patient taking differently: Take 10 mg by mouth at bedtime.) 90 tablet 3   finasteride (PROSCAR) 5 MG tablet Take 5 mg by mouth at bedtime.     fluvoxaMINE (LUVOX) 100 MG tablet Take 200 mg by mouth at bedtime.     lamoTRIgine (LAMICTAL) 200 MG tablet Take 200 mg by mouth at bedtime.     multivitamin (ONE-A-DAY MEN'S) TABS tablet Take 1 tablet by mouth at bedtime. (Patient  not taking: Reported on 04/21/2023)     PREVIDENT 5000 BOOSTER PLUS 1.1 % PSTE Place 1 Application onto teeth daily. (Patient not taking: Reported on 03/23/2023)     rosuvastatin (CRESTOR) 20 MG tablet Take 1 tablet (20 mg total) by mouth daily. (Patient taking differently: Take 20 mg by mouth at bedtime.) 90 tablet 3   sildenafil (VIAGRA) 50 MG tablet Take 1 tablet (50 mg total) by mouth daily as needed for erectile dysfunction (Use 1 hour before sexual activity.). (Patient not taking: Reported on 04/21/2023) 30 tablet 2   sulfamethoxazole-trimethoprim (BACTRIM DS) 800-160 MG tablet Take 1 tablet by mouth 2 (two) times daily. X 3 days. Begin day before next Urology appointment. 6 tablet 0   WELLBUTRIN XL 150 MG 24 hr tablet Take 150 mg by mouth every other day.  No current facility-administered medications for this visit.    Allergies  Allergen Reactions   Levofloxacin     Avoid fluoroquinolone antibiotics due to thoracic aortic aneurysm   Penicillins Other (See Comments)    Childhood   Latex Rash    Diagnoses:  Major Depression, Panic, Grief  Plan of Care: Outpatient Psychotherapy and medication management  Initial Note: He states he has taken psychotropic medication for years and sees Clayton Lefort at [facility]. States [place] and feels he has suffered depression most of his life. He only took vacation once a year and would get really depressed when it was over. First significant depression was at 33 when he had a break-up. Had more minor depressive episodes earlier in life. Change has always been difficult for him. Also has had severe panic attacks. First one he remembers was first day of his fifth year of college. He was distressed that "this was the end of his college career". He recalls having a panic when on business trip to [place] in a [employer]. He had to come home. 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 He was  offered early retirement and had a panic when he realized that he just quit a job of 30 years and his life was changing. When he has panic, he doesn't have a fear of death, rather it is a fear of something he can't control. His anxiety was less prevalent in his life than the depression.  He says "my major problem in life is depression and panic". His mother passed and he is struggling with her loss. She died 2 weeks ago at 20 and was a vibrant person for 92 years. In 1 year, she had rapid dementia. He says, "she was his best friend". He has a small farm down the road from where his mother lived. He says "I don't handle death well". His reason to seek therapy was before mother was deteriorating. He had tried therapy in the past and it was not helpful. Also, he has had medical challenges himself, but says it does not cause him distress. He has been on a number of different anti-depressants over the years.  He contacted me for his depression, ut says the grief is now most prevalent.  He has 1 sister in [place]. She is 3 years older and they have a good relationship, but she has kids and grandchildren and they keep her busy. He married at 72 and wife was 70. His wife will occasionally tell him she blames him for not having kids. He told her when they married that he was too old to have kids. He says it was his "inability to handle change". This is his only marriage for both he and his wife. Wife is from [place] and has 3 sisters and one brother. None of her family in this area. He left [employer] at 5 ("too early"). 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. Wife retired in 2013.  Will complete intake at next visit. Patient states he wants to continue treatment and prefers face to face when possible.   Goals/Tx. Plan: Patient states that he is seeking symptom relief from life long depressive episodes. First, however, he needs support and strategies to manage grief over the recent (2 weeks) loss of  his mother. Will engage in grief counseling to reduce current despair. In addition, treatment will address his anxiety that  contributes to episodic panic attacks. This will involve relaxation techniques and other behavioral strategies  to facilitate greater control over his anxiety. Patient agrees to plan with a goal date of 12-24.   Patient was seen face to face in Provider's office.  Session note: States that the surgery went well, but the recovery has been difficult. Removed 60% of prostate. He has incontinence and had to wear a catheter 5 days. Says wife was supportive. Spends most days going back to the farm to work on all the paperwork from [employer]. A tree destroyed the roof of the barn on the farm. Says yesterday was an "exceptionally bad day". He was working and had to keep looking at the date of her death. This made him think about her death and the associated images. It was overwhelming and brought him to tears. He says that letting go of their house and property will be very hard for him. He will still retain much of the farm as he owns it, but will sell mother's portion.  We need to focus on Positive Psychology moving forward.             Garrel Ridgel, PhD 11:40a-12:30p 50 minutes

## 2023-04-23 ENCOUNTER — Telehealth: Payer: Self-pay | Admitting: Gastroenterology

## 2023-04-23 NOTE — Telephone Encounter (Signed)
See 04/21/23 imaging result note for further information.

## 2023-04-23 NOTE — Telephone Encounter (Signed)
Inbound call from patient's wife requesting a call to discuss scheduling patient's ultrasound. Also requesting to speak about hepatitis shots. Stated she is unsure if the patient has had any. Please advise. Thank you.

## 2023-04-24 LAB — ANTI-SMOOTH MUSCLE ANTIBODY, IGG: Actin (Smooth Muscle) Antibody (IGG): <20 U (ref <20)

## 2023-04-24 LAB — ALPHA-1-ANTITRYPSIN: A-1 Antitrypsin, Ser: 186 mg/dL (ref 83–199)

## 2023-04-24 LAB — IGG: IgG (Immunoglobin G), Serum: 649 mg/dL (ref 600–1540)

## 2023-04-24 LAB — HEPATITIS A ANTIBODY, TOTAL: Hepatitis A AB,Total: NONREACTIVE

## 2023-04-28 ENCOUNTER — Ambulatory Visit (HOSPITAL_COMMUNITY)
Admission: RE | Admit: 2023-04-28 | Discharge: 2023-04-28 | Disposition: A | Discharge status: Home / Self Care | Payer: Medicare HMO | Source: Ambulatory Visit | Attending: Gastroenterology | Admitting: Gastroenterology

## 2023-04-28 ENCOUNTER — Ambulatory Visit (INDEPENDENT_AMBULATORY_CARE_PROVIDER_SITE_OTHER): Payer: Medicare HMO | Admitting: Gastroenterology

## 2023-04-28 DIAGNOSIS — K760 Fatty (change of) liver, not elsewhere classified: Secondary | ICD-10-CM | POA: Diagnosis not present

## 2023-04-28 DIAGNOSIS — Z860100 Personal history of colon polyps, unspecified: Secondary | ICD-10-CM | POA: Diagnosis not present

## 2023-04-28 DIAGNOSIS — R7401 Elevation of levels of liver transaminase levels: Secondary | ICD-10-CM | POA: Insufficient documentation

## 2023-04-28 DIAGNOSIS — K7460 Unspecified cirrhosis of liver: Secondary | ICD-10-CM | POA: Diagnosis not present

## 2023-04-28 DIAGNOSIS — Z789 Other specified health status: Secondary | ICD-10-CM | POA: Diagnosis not present

## 2023-04-28 DIAGNOSIS — Z23 Encounter for immunization: Secondary | ICD-10-CM | POA: Diagnosis not present

## 2023-05-03 ENCOUNTER — Telehealth: Payer: Self-pay | Admitting: Family Medicine

## 2023-05-03 NOTE — Telephone Encounter (Signed)
Pts wife would like a call back concerning the last time pt received the pneumonia shot and if they need one this year. Please advise.

## 2023-05-05 ENCOUNTER — Ambulatory Visit: Payer: Medicare HMO | Admitting: Psychology

## 2023-05-05 DIAGNOSIS — F410 Panic disorder [episodic paroxysmal anxiety] without agoraphobia: Secondary | ICD-10-CM

## 2023-05-05 DIAGNOSIS — F4321 Adjustment disorder with depressed mood: Secondary | ICD-10-CM

## 2023-05-05 DIAGNOSIS — M25562 Pain in left knee: Secondary | ICD-10-CM | POA: Diagnosis not present

## 2023-05-05 DIAGNOSIS — F331 Major depressive disorder, recurrent, moderate: Secondary | ICD-10-CM | POA: Diagnosis not present

## 2023-05-05 NOTE — Progress Notes (Signed)
[facility] Counselor Initial Adult Exam  Name: Eugene Guzman. Date: 05/05/2023 MRN: [id number] DOB: 05/16/1956 PCP: [name], MD  Time spent: 60 minutes  Guardian/Payee:  N/A    Paperwork requested: No   Reason for Visit /Presenting Problem: Symptoms of Depression, Grief and Panic   Mental Status Exam: Appearance:   Casual     Behavior:  Appropriate  Motor:  Normal  Speech/Language:   Clear and Coherent  Affect:  Appropriate  Mood:  depressed  Thought process:  normal  Thought content:    WNL  Sensory/Perceptual disturbances:    WNL  Orientation:  oriented to person, place, and situation  Attention:  Good  Concentration:  Good  Memory:  WNL  Fund of knowledge:   Good  Insight:    Good  Judgment:   Good  Impulse Control:  Good    Reported Symptoms:  sadness, powerlessness, anxiety/panic  Risk Assessment: Danger to Self:  No Self-injurious Behavior: No Danger to Others: No Duty to Warn:no Physical Aggression / Violence:No  Access to Firearms a concern: No  Gang Involvement:No  Patient / guardian was educated about steps to take if suicide or homicide risk level increases between visits: n/a While future psychiatric events cannot be accurately predicted, the patient does not currently require acute inpatient psychiatric care and does not currently meet [facility] involuntary commitment criteria.  Substance Abuse History: Current substance abuse: No     Past Psychiatric History:   Previous psychological history is significant for depression and panic Outpatient Providers:Lisa Polis History of Psych Hospitalization: No  Psychological Testing:  N/A    Abuse History:  Victim of: No.,  N/A    Report needed: No. Victim of Neglect:No. Perpetrator of  N/A   Witness / Exposure to Domestic Violence: No   Protective Services Involvement: No   Witness to [employer] Violence:  No   Family History:  Family History  Problem Relation Age of Onset   Hypertension Mother    Cancer Mother    Hearing loss Mother    Heart attack Father 42   Arthritis Father    Depression Father    Heart disease Father    Depression Paternal Uncle    Depression Paternal Uncle    Ovarian cancer Maternal Grandmother    Cancer Maternal Grandmother    Hearing loss Maternal Grandfather    Heart disease Maternal Grandfather    Hypertension Maternal Grandfather    Stroke Maternal Grandfather    Colon cancer Paternal Grandfather 35   Cancer Paternal Grandfather     Living situation: the patient lives with their spouse  Sexual Orientation: Straight  Relationship Status: married  Name of spouse / other:unknown If a parent, number of children / ages:none  Support Systems: N/A  Surveyor, quantity Stress:  No   Income/Employment/Disability: Social Herbalist:  unknown  Educational History: Education: Risk manager: unknown  Any cultural differences that may affect / interfere with treatment:  not applicable   Recreation/Hobbies: unknown  Stressors: Loss of mother    Strengths: Supportive Relationships and Able to Communicate Effectively  Barriers:  unknown   Legal History: Pending legal issue / charges: The patient has no  significant history of legal issues. History of legal issue / charges:  N/A  Medical History/Surgical History: reviewed Past Medical History:  Diagnosis Date   Allergy    childhood - penicillin, 2014 - latex   Anxiety    Aortic aneurysm (HCC)    Arthritis 2014   right shoulder   CLL (chronic lymphocytic leukemia) (HCC)    Coronary artery calcification seen on CT scan    Depressed    Hx of adenomatous polyp of colon 02/2018   Dr. Lianne Moris   Hyperlipidemia    Hypertension    Sleep apnea 2022   Have stopped using CPAP    Past Surgical History:   Procedure Laterality Date   CORONARY PRESSURE/FFR STUDY N/A 04/28/2022   Procedure: INTRAVASCULAR PRESSURE WIRE/FFR STUDY;  Surgeon: [name], MD;  Location: [facility] INVASIVE CV LAB;  Service: Cardiovascular;  Laterality: N/A;   LEFT HEART CATH AND CORONARY ANGIOGRAPHY N/A 04/28/2022   Procedure: LEFT HEART CATH AND CORONARY ANGIOGRAPHY;  Surgeon: [name], MD;  Location: [facility] INVASIVE CV LAB;  Service: Cardiovascular;  Laterality: N/A;   TONSILLECTOMY  1961   TRANSURETHRAL RESECTION OF PROSTATE N/A 03/19/2023   Procedure: TRANSURETHRAL RESECTION OF THE PROSTATE (TURP);  Surgeon: [name]., MD;  Location: [facility] ORS;  Service: Urology;  Laterality: N/A;    Medications: Current Outpatient Medications  Medication Sig Dispense Refill   amLODipine (NORVASC) 2.5 MG tablet Take 1 tablet (2.5 mg total) by mouth daily. 90 tablet 3   aspirin EC 81 MG tablet Take 1 tablet (81 mg total) by mouth daily. Swallow whole. (Patient not taking: Reported on 04/21/2023) 90 tablet 3   b complex vitamins capsule Take 1 capsule by mouth daily.     bismuth subsalicylate (PEPTO BISMOL) 262 MG/15ML suspension Take 30 mLs by mouth every 6 (six) hours as needed for indigestion or diarrhea or loose stools. (Patient not taking: Reported on 04/21/2023)     Cholecalciferol (VITAMIN D3) 50 MCG (2000 UT) CAPS Take 1 capsule by mouth daily. (Patient not taking: Reported on 04/21/2023)     clonazePAM (KLONOPIN) 0.5 MG tablet Take 0.5 mg by mouth 4 (four) times daily as needed for anxiety.     ezetimibe (ZETIA) 10 MG tablet Take 1 tablet (10 mg total) by mouth daily. (Patient taking differently: Take 10 mg by mouth at bedtime.) 90 tablet 3   finasteride (PROSCAR) 5 MG tablet Take 5 mg by mouth at bedtime.     fluvoxaMINE (LUVOX) 100 MG tablet Take 200 mg by mouth at bedtime.     lamoTRIgine (LAMICTAL) 200 MG tablet Take 200 mg by mouth at bedtime.     multivitamin (ONE-A-DAY MEN'S) TABS tablet Take 1 tablet by  mouth at bedtime. (Patient not taking: Reported on 04/21/2023)     PREVIDENT 5000 BOOSTER PLUS 1.1 % PSTE Place 1 Application onto teeth daily. (Patient not taking: Reported on 03/23/2023)     rosuvastatin (CRESTOR) 20 MG tablet Take 1 tablet (20 mg total) by mouth daily. (Patient taking differently: Take 20 mg by mouth at bedtime.) 90 tablet 3   sildenafil (VIAGRA) 50 MG tablet Take 1 tablet (50 mg total) by mouth daily as needed for erectile dysfunction (Use 1 hour before sexual activity.). (Patient not taking: Reported on 04/21/2023) 30 tablet 2   sulfamethoxazole-trimethoprim (BACTRIM DS) 800-160 MG tablet Take 1 tablet by mouth 2 (two) times daily. X 3 days. Begin day before next Urology appointment. 6 tablet 0   WELLBUTRIN  XL 150 MG 24 hr tablet Take 150 mg by mouth every other day.     No current facility-administered medications for this visit.    Allergies  Allergen Reactions   Levofloxacin     Avoid fluoroquinolone antibiotics due to thoracic aortic aneurysm   Penicillins Other (See Comments)    Childhood   Latex Rash    Diagnoses:  Major Depression, Panic, Grief  Plan of Care: Outpatient Psychotherapy and medication management  Initial Note: He states he has taken psychotropic medication for years and sees Clayton Lefort at [facility]. [place] and feels he has suffered depression most of his life. He only took vacation once a year and would get really depressed when it was over. First significant depression was at 33 when he had a break-up. Had more minor depressive episodes earlier in life. Change has always been difficult for him. Also has had severe panic attacks. First one he remembers was first day of his fifth year of college. He was distressed that "this was the end of his college career". He recalls having a panic when on business trip to [place] in a [employer]. He had to come home. 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 He was offered early retirement and had a panic when he realized that he just quit a job of 30 years and his life was changing. When he has panic, he doesn't have a fear of death, rather it is a fear of something he can't control. His anxiety was less prevalent in his life than the depression.  He says "my major problem in life is depression and panic". His mother passed and he is struggling with her loss. She died 2 weeks ago at 6 and was a vibrant person for 92 years. In 1 year, she had rapid dementia. He says, "she was his best friend". He has a small farm down the road from where his mother lived. He says "I don't handle death well". His reason to seek therapy was before mother was deteriorating. He had tried therapy in the past and it was not helpful. Also, he has had medical challenges himself, but says it does not cause him distress. He has been on a number of different anti-depressants over the years.  He contacted me for his depression, ut says the grief is now most prevalent.  He has 1 sister in [place]. She is 3 years older and they have a good relationship, but she has kids and grandchildren and they keep her busy. He married at 49 and wife was 75. His wife will occasionally tell him she blames him for not having kids. He told her when they married that he was too old to have kids. He says it was his "inability to handle change". This is his only marriage for both he and his wife. Wife is from [place] and has 3 sisters and one brother. None of her family in this area. He left [employer] at 54 ("too early"). 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. Wife retired in 2013.  Will complete intake at next visit. Patient states he wants to continue treatment and prefers face to face when possible.   Goals/Tx. Plan: Patient states that he is seeking symptom relief from life long depressive episodes. First, however, he needs support and strategies to manage grief over the  recent (2 weeks) loss of his  mother. Will engage in grief counseling to reduce current despair. In addition, treatment will address his anxiety that contributes to episodic panic attacks. This will involve relaxation techniques and other behavioral strategies  to facilitate greater control over his anxiety. Patient agrees to plan with a goal date of 12-24.   Patient was seen face to face in Provider's office.  Session note: Patient states that he tripped over a hose and hurt his knee. Having a hard time walking. Suggested that he get examined by doctor. [name] says he spent a day going through his mother's attic clearing out things from childhood. This was very hard for him. He has been having trouble sleeping, which creates panic and creates more sleeplessness. He is having a very difficult time getting up and going in the morning. Feels that he doesn't have anything positive in his life that he can look forward to.  We talked about getting his sleep patterns established to help his mood states.  He states that he thinks (and hopes!) his wife is softening on the idea of building a house on the farm.               Garrel Ridgel, PhD 1:10p-2:00p 50 minutes

## 2023-05-05 NOTE — Telephone Encounter (Signed)
Patient's wife notified of message below and verbalized understanding.

## 2023-05-06 ENCOUNTER — Telehealth: Payer: Self-pay | Admitting: Neurology

## 2023-05-06 NOTE — Telephone Encounter (Signed)
Pt called wanting to discuss other possible options than the Cpap machine. Please advise.

## 2023-05-07 ENCOUNTER — Ambulatory Visit (HOSPITAL_COMMUNITY)
Admission: RE | Admit: 2023-05-07 | Discharge: 2023-05-07 | Disposition: A | Discharge status: Home / Self Care | Payer: Medicare HMO | Source: Ambulatory Visit | Attending: Vascular Surgery | Admitting: Vascular Surgery

## 2023-05-07 ENCOUNTER — Other Ambulatory Visit (HOSPITAL_COMMUNITY): Payer: Self-pay | Admitting: Medical

## 2023-05-07 DIAGNOSIS — R52 Pain, unspecified: Secondary | ICD-10-CM | POA: Diagnosis not present

## 2023-05-17 NOTE — Telephone Encounter (Signed)
Ok, is he on hold or need to be scheduled? I dont see he is scheduled

## 2023-05-17 NOTE — Telephone Encounter (Signed)
Pt returned call and LVM. Appt has been put on hold with MD on 06/28/23.

## 2023-05-17 NOTE — Telephone Encounter (Signed)
Called pt and LVM for him to call back and get scheduled to discuss other options besides the Cpap machine.

## 2023-05-18 ENCOUNTER — Other Ambulatory Visit: Payer: Self-pay | Admitting: Thoracic Surgery (Cardiothoracic Vascular Surgery)

## 2023-05-18 DIAGNOSIS — I7121 Aneurysm of the ascending aorta, without rupture: Secondary | ICD-10-CM

## 2023-05-19 ENCOUNTER — Ambulatory Visit: Payer: Medicare HMO | Admitting: Psychology

## 2023-05-19 DIAGNOSIS — F331 Major depressive disorder, recurrent, moderate: Secondary | ICD-10-CM | POA: Diagnosis not present

## 2023-05-19 NOTE — Progress Notes (Signed)
[facility] Counselor Initial Adult Exam  Name: Eugene Guzman. Date: 05/19/2023 MRN: [id number] DOB: [birth year] PCP: [name], MD  Time spent: 60 minutes  Guardian/Payee:  N/A    Paperwork requested: No   Reason for Visit /Presenting Problem: Symptoms of Depression, Grief and Panic   Mental Status Exam: Appearance:   Casual     Behavior:  Appropriate  Motor:  Normal  Speech/Language:   Clear and Coherent  Affect:  Appropriate  Mood:  depressed  Thought process:  normal  Thought content:    WNL  Sensory/Perceptual disturbances:    WNL  Orientation:  oriented to person, place, and situation  Attention:  Good  Concentration:  Good  Memory:  WNL  Fund of knowledge:   Good  Insight:    Good  Judgment:   Good  Impulse Control:  Good    Reported Symptoms:  sadness, powerlessness, anxiety/panic  Risk Assessment: Danger to Self:  No Self-injurious Behavior: No Danger to Others: No Duty to Warn:no Physical Aggression / Violence:No  Access to Firearms a concern: No  Gang Involvement:No  Patient / guardian was educated about steps to take if suicide or homicide risk level increases between visits: n/a While future psychiatric events cannot be accurately predicted, the patient does not currently require acute inpatient psychiatric care and does not currently meet [facility] involuntary commitment criteria.  Substance Abuse History: Current substance abuse: No     Past Psychiatric History:   Previous psychological history is significant for depression and panic Outpatient Providers:Lisa Polis History of Psych Hospitalization: No  Psychological Testing:  N/A    Abuse History:  Victim of: No.,  N/A    Report needed: No. Victim of Neglect:No. Perpetrator of  N/A   Witness / Exposure to Domestic Violence: No    Protective Services Involvement: No  Witness to [employer] Violence:  No   Family History:  Family History  Problem Relation Age of Onset   Hypertension Mother    Cancer Mother    Hearing loss Mother    Heart attack Father 74   Arthritis Father    Depression Father    Heart disease Father    Depression Paternal Uncle    Depression Paternal Uncle    Ovarian cancer Maternal Grandmother    Cancer Maternal Grandmother    Hearing loss Maternal Grandfather    Heart disease Maternal Grandfather    Hypertension Maternal Grandfather    Stroke Maternal Grandfather    Colon cancer Paternal Grandfather 85   Cancer Paternal Grandfather     Living situation: the patient lives with their spouse  Sexual Orientation: Straight  Relationship Status: married  Name of spouse / other:unknown If a parent, number of children / ages:none  Support Systems: N/A  Surveyor, quantity Stress:  No   Income/Employment/Disability: Social Herbalist:  unknown  Educational History: Education: Risk manager: unknown  Any cultural differences that may affect / interfere with treatment:  not applicable   Recreation/Hobbies: unknown  Stressors: Loss of mother    Strengths: Supportive Relationships and Able to Communicate Effectively  Barriers:  unknown   Legal History: Pending legal issue / charges: The patient has no significant history of legal issues. History of legal issue / charges:  N/A  Medical History/Surgical History: reviewed Past Medical History:  Diagnosis Date   Allergy    childhood - penicillin, 2014 - latex   Anxiety    Aortic aneurysm (HCC)    Arthritis 2014   right shoulder   CLL (chronic lymphocytic leukemia) (HCC)    Coronary artery calcification seen on CT scan    Depressed    Hx of adenomatous polyp of colon 02/2018   Dr. Lianne Moris   Hyperlipidemia    Hypertension    Sleep apnea 2022   Have stopped using  CPAP    Past Surgical History:  Procedure Laterality Date   CORONARY PRESSURE/FFR STUDY N/A 04/28/2022   Procedure: INTRAVASCULAR PRESSURE WIRE/FFR STUDY;  Surgeon: [name], MD;  Location: [facility] INVASIVE CV LAB;  Service: Cardiovascular;  Laterality: N/A;   LEFT HEART CATH AND CORONARY ANGIOGRAPHY N/A 04/28/2022   Procedure: LEFT HEART CATH AND CORONARY ANGIOGRAPHY;  Surgeon: [name], MD;  Location: [facility] INVASIVE CV LAB;  Service: Cardiovascular;  Laterality: N/A;   TONSILLECTOMY  1961   TRANSURETHRAL RESECTION OF PROSTATE N/A 03/19/2023   Procedure: TRANSURETHRAL RESECTION OF THE PROSTATE (TURP);  Surgeon: [name]., MD;  Location: [facility] OR[facility];  Service: Urology;  Laterality: N/A;    Medications: Current Outpatient Medications  Medication Sig Dispense Refill   amLODipine (NORVASC) 2.5 MG tablet Take 1 tablet (2.5 mg total) by mouth daily. 90 tablet 3   aspirin EC 81 MG tablet Take 1 tablet (81 mg total) by mouth daily. Swallow whole. (Patient not taking: Reported on 04/21/2023) 90 tablet 3   b complex vitamins capsule Take 1 capsule by mouth daily.     bismuth subsalicylate (PEPTO BISMOL) 262 MG/15ML suspension Take 30 mLs by mouth every 6 (six) hours as needed for indigestion or diarrhea or loose stools. (Patient not taking: Reported on 04/21/2023)     Cholecalciferol (VITAMIN D3) 50 MCG (2000 UT) CAPS Take 1 capsule by mouth daily. (Patient not taking: Reported on 04/21/2023)     clonazePAM (KLONOPIN) 0.5 MG tablet Take 0.5 mg by mouth 4 (four) times daily as needed for anxiety.     ezetimibe (ZETIA) 10 MG tablet Take 1 tablet (10 mg total) by mouth daily. (Patient taking differently: Take 10 mg by mouth at bedtime.) 90 tablet 3   finasteride (PROSCAR) 5 MG tablet Take 5 mg by mouth at bedtime.     fluvoxaMINE (LUVOX) 100 MG tablet Take 200 mg by mouth at bedtime.     lamoTRIgine (LAMICTAL) 200 MG tablet Take 200 mg by mouth at bedtime.     multivitamin (ONE-A-DAY  MEN'S) TABS tablet Take 1 tablet by mouth at bedtime. (Patient not taking: Reported on 04/21/2023)     PREVIDENT 5000 BOOSTER PLUS 1.1 % PSTE Place 1 Application onto teeth daily. (Patient not taking: Reported on 03/23/2023)     rosuvastatin (CRESTOR) 20 MG tablet Take 1 tablet (20 mg total) by mouth daily. (Patient taking differently: Take 20 mg by mouth at bedtime.) 90 tablet 3   sildenafil (VIAGRA) 50 MG tablet Take 1 tablet (50 mg total) by mouth daily as needed for erectile dysfunction (Use 1 hour before sexual activity.). (Patient not taking: Reported on 04/21/2023) 30 tablet 2   sulfamethoxazole-trimethoprim (BACTRIM DS) 800-160 MG tablet Take 1 tablet by mouth 2 (two) times daily. X  3 days. Begin day before next Urology appointment. 6 tablet 0   WELLBUTRIN XL 150 MG 24 hr tablet Take 150 mg by mouth every other day.     No current facility-administered medications for this visit.    Allergies  Allergen Reactions   Levofloxacin     Avoid fluoroquinolone antibiotics due to thoracic aortic aneurysm   Penicillins Other (See Comments)    Childhood   Latex Rash    Diagnoses:  Major Depression, Panic, Grief  Plan of Care: Outpatient Psychotherapy and medication management  Initial Note: He states he has taken psychotropic medication for years and sees Clayton Lefort at [facility]. States [place] and feels he has suffered depression most of his life. He only took vacation once a year and would get really depressed when it was over. First significant depression was at 33 when he had a break-up. Had more minor depressive episodes earlier in life. Change has always been difficult for him. Also has had severe panic attacks. First one he remembers was first day of his fifth year of college. He was distressed that "this was the end of his college career". He recalls having a panic when on business trip to [place] in a [employer]. He had to come home. 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 He was offered early retirement and had a panic when he realized that he just quit a job of 30 years and his life was changing. When he has panic, he doesn't have a fear of death, rather it is a fear of something he can't control. His anxiety was less prevalent in his life than the depression.  He says "my major problem in life is depression and panic". His mother passed and he is struggling with her loss. She died 2 weeks ago at 23 and was a vibrant person for 92 years. In 1 year, she had rapid dementia. He says, "she was his best friend". He has a small farm down the road from where his mother lived. He says "I don't handle death well". His reason to seek therapy was before mother was deteriorating. He had tried therapy in the past and it was not helpful. Also, he has had medical challenges himself, but says it does not cause him distress. He has been on a number of different anti-depressants over the years.  He contacted me for his depression, ut says the grief is now most prevalent.  He has 1 sister in [place]. She is 3 years older and they have a good relationship, but she has kids and grandchildren and they keep her busy. He married at 68 and wife was 56. His wife will occasionally tell him she blames him for not having kids. He told her when they married that he was too old to have kids. He says it was his "inability to handle change". This is his only marriage for both he and his wife. Wife is from [place] and has 3 sisters and one brother. None of her family in this area. He left [employer] at 57 ("too early"). 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. Wife retired in 2013.  Will complete intake at next visit. Patient states he wants to continue treatment and prefers face to face when possible.   Goals/Tx. Plan: Patient states that he is seeking symptom relief from life long depressive episodes. First, however, he needs support  and  strategies to manage grief over the recent (2 weeks) loss of his mother. Will engage in grief counseling to reduce current despair. In addition, treatment will address his anxiety that contributes to episodic panic attacks. This will involve relaxation techniques and other behavioral strategies  to facilitate greater control over his anxiety. Patient agrees to plan with a goal date of 12-24.   Patient was seen face to face in Provider's office.  Session note: Patient states that all of his medical problems along with loss of mother is "more than he an handle" and he has been using alcohol to medicate himself. Wants to now address this drinking problem. We talked more specifics about his drinking behavior.  For many years, he drank only in the evening. When mother was ill, he would have 2-3 beers before visiting her and then have 2-3 more afterwards. Later, when going home, would have another 2-3. He was essentially drinking a dozen beers a day. Wife has been complaining about his drinking for a long time now. His wife told him "I lost my father when I was 40 and you are 17, it is time to grow up".  He will start drinking early in day (11am), usually when he has to go to mother's home. On days not going to her home, he will start drinking middle of afternoon. Says that this year has been horrible with regard to his drinking.  Discussed controlled drinking versus abstinence.  Suggested that he try a few AA meetings. Wiil look them up, but may wait to see how successful he is with cutting back. Parents were not drinkers and neither were grandparents. He says he drinks to numb his pain. He is "scared or unwilling" to go to AA unless he is not successful on his own. Told him to keep an accurate record of his drinking and report back.               Garrel Ridgel, PhD 10:40a-11:30a 50 minutes

## 2023-05-20 DIAGNOSIS — M25562 Pain in left knee: Secondary | ICD-10-CM | POA: Diagnosis not present

## 2023-05-26 ENCOUNTER — Ambulatory Visit: Payer: Medicare HMO | Admitting: Hematology

## 2023-05-26 ENCOUNTER — Other Ambulatory Visit: Payer: Medicare HMO

## 2023-05-28 ENCOUNTER — Other Ambulatory Visit: Payer: Self-pay | Admitting: Pharmacist

## 2023-05-28 NOTE — Progress Notes (Signed)
05/28/2023 Name: [name]. MRN: [id number] DOB: [birth year]  Chief Complaint  Patient presents with   Medication Management    Eugene Guzman. is a 67 y.o. year old male who presented for a telephone visit. Eugene Guzman wife was also present during phone visit today.   They were referred to the pharmacist by their PCP for assistance in managing complex medication management.    Subjective:  Care Team: Primary Care Provider: [name], MD ; Next Scheduled Visit: 09/08/2023 Neurologist: Eugene Guzman; Next Scheduled Visit: 07/22/2023 Oncologist / Hematologist: Eugene Guzman; Next Scheduled Visit: 10/13/2023 Urologist: Eugene Guzman Cardiologist: Eugene Guzman; Next Scheduled Visit 06/15/2023 Psychiatry: Eugene Savage, NP - 06/2023 Counselor: Eugene Guzman - usually monthly visits  Medication Access/Adherence  Current Pharmacy:  [id number] - [address] [place] [address] Phone: [phone] Fax: [phone]  [facility] - [place], [place] - [address] [place] [address] Phone: [phone] Fax: [phone]   Patient reports affordability concerns with their medications: No  Patient reports access/transportation concerns to their pharmacy: No  Patient reports adherence concerns with their medications:  No    Last phone visit was 03/2023. At that visit patient has expressed concern about fatigue and loss of balance. He wanted to know if could have been related to his medication and also to review med list for any drug-drug interactions.  I had recommended he move bupropion / Wellbutrin dose to morning to see if this helped with daytime fatigue and difficulty sleeping at night. Also changed extimibe to qpm. Patient has made these changes but has not seen much change in either daytime fatigue / somnolence or improvement in balance.  Eugene Guzman continues to have regular counseling sessions with  therapist and will see Eugene Seats, NP in December. He reports he has tried several difference psychiatric medications in past. Currently taking: Wellbutrin 150mg  ER once every other day in the morning. Flluoxamine 100mg  at bedtime and lamotrigine 200mg  at bedtime.  Eugene Guzman has had stressful year due to declining health of his mother and her death in 01-21-2023. He reports that it is hard to determine if symptoms like difficulty sleeping, fatigue are related to depression or possibly medications.   He has stopped using his sleep apnea machine. Will have follow up with Eugene Guzman in January 2025 to discuss other treatments for sleep apnea, He reports he usually fall asleep watching TV and sleeps about 3 hours. He then moves to his bed and sleeps another 4 hours. Wife reports he sleeps a majority of the day as well.   They are engaging with friends and family on a regular basis and trying to incorporate more physical activities into their day.    Objective:  Lab Results  Component Value Date   HGBA1C 5.3 11/09/2022    Lab Results  Component Value Date   CREATININE 0.76 01/20/2023   BUN 11 01/20/2023   NA 136 01/20/2023   K 4.2 01/20/2023   CL 100 01/20/2023   CO2 21 01/20/2023    Lab Results  Component Value Date   CHOL 157 01/20/2023   HDL 110.50 01/20/2023   LDLCALC 37 01/20/2023   TRIG 47.0 01/20/2023   CHOLHDL 1 01/20/2023    Medications Reviewed Today     Reviewed by Eugene Guzman (Pharmacist) on 05/28/23 at 1206  Med List Status: <None>   Medication Order Taking?  Sig Documenting Provider Last Dose Status Informant  amLODipine (NORVASC) 2.5 MG tablet [id number] Yes Take 1 tablet (2.5 mg total) by mouth daily. Eugene Riffle, MD Taking Active   aspirin EC 81 MG tablet [id number]  Take 1 tablet (81 mg total) by mouth daily. Swallow whole.  Patient not taking: Reported on 04/21/2023   Eugene Riffle, MD  Active Spouse/Significant Other  b complex vitamins capsule  [id number]  Take 1 capsule by mouth daily. [provider]  Active Spouse/Significant Other  bismuth subsalicylate (PEPTO BISMOL) 262 MG/15ML suspension [id number]  Take 30 mLs by mouth every 6 (six) hours as needed for indigestion or diarrhea or loose stools.  Patient not taking: Reported on 04/21/2023   [provider]  Active Spouse/Significant Other           Med Note (RATLIFF, THURSHELL   Wed Apr 21, 2023  1:24 PM) As needed  Cholecalciferol (VITAMIN D3) 50 MCG (2000 UT) CAPS [id number]  Take 1 capsule by mouth daily.  Patient not taking: Reported on 04/21/2023   [provider]  Active   clonazePAM (KLONOPIN) 0.5 MG tablet [id number] Yes Take 0.5 mg by mouth 4 (four) times daily as needed for anxiety. [provider] Taking Active Spouse/Significant Other  ezetimibe (ZETIA) 10 MG tablet [id number] Yes Take 1 tablet (10 mg total) by mouth daily.  Patient taking differently: Take 10 mg by mouth daily at 12 noon. Each morning   Eugene Riffle, MD Taking Active Spouse/Significant Other  finasteride (PROSCAR) 5 MG tablet [id number] Yes Take 5 mg by mouth at bedtime. [provider] Taking Active Spouse/Significant Other  fluvoxaMINE (LUVOX) 100 MG tablet [id number] Yes Take 200 mg by mouth at bedtime. [provider] Taking Active Spouse/Significant Other  lamoTRIgine (LAMICTAL) 200 MG tablet [id number] Yes Take 200 mg by mouth at bedtime. [provider] Taking Active Spouse/Significant Other  multivitamin (ONE-A-DAY MEN'S) TABS tablet [id number]  Take 1 tablet by mouth at bedtime.  Patient not taking: Reported on 04/21/2023   [provider]  Active Spouse/Significant Other  PREVIDENT 5000 BOOSTER PLUS 1.1 % PSTE [id number]  Place 1 Application onto teeth daily.  Patient not taking: Reported on 03/23/2023   [provider]  Active Spouse/Significant Other           Med Note Clydie Braun, Legend Tumminello B   Tue Mar 23, 2023  1:29 PM)     rosuvastatin (CRESTOR) 20 MG tablet [id number] Yes Take 1 tablet (20 mg total) by mouth daily.  Patient taking differently: Take 20 mg by mouth at bedtime.   Eugene Riffle, MD Taking Active Spouse/Significant Other  sildenafil (VIAGRA) 50 MG tablet [id number]  Take 1 tablet (50 mg total) by mouth daily as needed for erectile dysfunction (Use 1 hour before sexual activity.).  Patient not taking: Reported on 04/21/2023   Eugene Riffle, MD  Active Spouse/Significant Other  WELLBUTRIN XL 150 MG 24 hr tablet [id number] Yes Take 150 mg by mouth every other day. [provider] Taking Active Spouse/Significant Other              Assessment/Plan:   Medication Management: - Reviewed medication list and updated.  - Suggested patient discuss meds that could be cause of fatigue with his psychiatric prescribing provider - lamotrigine has the following reported side effects - 14% solnolence, 4 to 10% tremor and 4% gait changes. Fluvoxamine has the following reported side effects - somnolence 22% (versus 8% placebo), 5% tremor and 15 -  21% insomnia (versus 10% placebo) - Recommend continued physical activity - consider PT referral if patient continues to have issues with balance.    Follow Up - non at this time but patient can reach out to Clinical Pharmacist at anytime with future medication related questions.   Eugene Guzman, PharmD Clinical Pharmacist [facility]  Population Health [phone]

## 2023-05-31 ENCOUNTER — Telehealth: Payer: Self-pay | Admitting: Gastroenterology

## 2023-05-31 NOTE — Telephone Encounter (Signed)
Spoke with MD about patient not taking the miralax for the last 6 days and MD wants patient to do a half of miralax prep asap before starting his suprep tonight. Called patient back and advised him to mix 119g bottle of miralax into a 32 oz of gatorade and start drinking asap over the next few hours. Patient understood and had no further questions.

## 2023-05-31 NOTE — Telephone Encounter (Signed)
Pt has been scheduled.  °

## 2023-05-31 NOTE — Telephone Encounter (Signed)
Inbound call from patient, has a procedure tomorrow at 11:00 AM. Patient states per his last OV with Dr. Adela Lank, he was supposed to take Miralax a week prior to his procedure. Patient states he has followed every instruction, however forgot to take the Miralax prior to. Patient would like to know if he can continue with procedure.

## 2023-06-01 ENCOUNTER — Ambulatory Visit: Payer: Medicare HMO | Admitting: Gastroenterology

## 2023-06-01 ENCOUNTER — Other Ambulatory Visit: Payer: Medicare HMO | Admitting: Pharmacist

## 2023-06-01 ENCOUNTER — Encounter: Payer: Self-pay | Admitting: Gastroenterology

## 2023-06-01 VITALS — BP 121/70 | HR 60 | Temp 97.4°F | Resp 14 | Ht 70.0 in | Wt 172.0 lb

## 2023-06-01 DIAGNOSIS — Z860101 Personal history of adenomatous and serrated colon polyps: Secondary | ICD-10-CM | POA: Diagnosis not present

## 2023-06-01 DIAGNOSIS — K5730 Diverticulosis of large intestine without perforation or abscess without bleeding: Secondary | ICD-10-CM | POA: Diagnosis not present

## 2023-06-01 DIAGNOSIS — D120 Benign neoplasm of cecum: Secondary | ICD-10-CM | POA: Diagnosis not present

## 2023-06-01 DIAGNOSIS — D123 Benign neoplasm of transverse colon: Secondary | ICD-10-CM | POA: Diagnosis not present

## 2023-06-01 DIAGNOSIS — Z860100 Personal history of colon polyps, unspecified: Secondary | ICD-10-CM

## 2023-06-01 DIAGNOSIS — K635 Polyp of colon: Secondary | ICD-10-CM

## 2023-06-01 DIAGNOSIS — Z1211 Encounter for screening for malignant neoplasm of colon: Secondary | ICD-10-CM

## 2023-06-01 DIAGNOSIS — K648 Other hemorrhoids: Secondary | ICD-10-CM

## 2023-06-01 DIAGNOSIS — I10 Essential (primary) hypertension: Secondary | ICD-10-CM | POA: Diagnosis not present

## 2023-06-01 DIAGNOSIS — F419 Anxiety disorder, unspecified: Secondary | ICD-10-CM | POA: Diagnosis not present

## 2023-06-01 DIAGNOSIS — G4730 Sleep apnea, unspecified: Secondary | ICD-10-CM | POA: Diagnosis not present

## 2023-06-01 MED ORDER — SODIUM CHLORIDE 0.9 % IV SOLN: 500.0000 mL | INTRAVENOUS | Status: AC

## 2023-06-01 NOTE — Progress Notes (Signed)
[facility] History and Physical   Primary Care Physician:  Eugene Sow, MD   Reason for Procedure:   History of colon polyps  Plan:    colonoscopy     HPI: Eugene Guzman. is a 67 y.o. male  here for colonoscopy surveillance - 5 polyps, most adenomas, removed 2019 - Dr. Kinnie Guzman.   Patient has some mild constipation. No family history of colon cancer known. Otherwise feels well without any cardiopulmonary symptoms.   I have discussed risks / benefits of anesthesia and endoscopic procedure with Eugene Guzman. and they wish to proceed with the exams as outlined today.    Past Medical History:  Diagnosis Date   Allergy    childhood - penicillin, 2014 - latex   Anxiety    Aortic aneurysm (HCC)    Arthritis 2014   right shoulder   CLL (chronic lymphocytic leukemia) (HCC)    Coronary artery calcification seen on CT scan    Depressed    Hx of adenomatous polyp of colon 02/2018   Dr. Lianne Guzman   Hyperlipidemia    Hypertension    Sleep apnea 2022   Have stopped using CPAP    Past Surgical History:  Procedure Laterality Date   CORONARY PRESSURE/FFR STUDY N/A 04/28/2022   Procedure: INTRAVASCULAR PRESSURE WIRE/FFR STUDY;  Surgeon: Eugene Lex, MD;  Location: [facility] INVASIVE CV LAB;  Service: Cardiovascular;  Laterality: N/A;   LEFT HEART CATH AND CORONARY ANGIOGRAPHY N/A 04/28/2022   Procedure: LEFT HEART CATH AND CORONARY ANGIOGRAPHY;  Surgeon: Eugene Lex, MD;  Location: [facility] INVASIVE CV LAB;  Service: Cardiovascular;  Laterality: N/A;   TONSILLECTOMY  1961   TRANSURETHRAL RESECTION OF PROSTATE N/A 03/19/2023   Procedure: TRANSURETHRAL RESECTION OF THE PROSTATE (TURP);  Surgeon: Eugene Guzman., MD;  Location: [facility] ORS;  Service: Urology;  Laterality: N/A;    Prior to Admission medications   Medication Sig Start Date End Date Taking? Authorizing Provider  amLODipine (NORVASC) 2.5 MG tablet Take 1 tablet (2.5 mg total) by mouth daily.  03/10/23  Yes Eugene Riffle, MD  aspirin EC 81 MG tablet Take 1 tablet (81 mg total) by mouth daily. Swallow whole. 02/10/22  Yes Eugene Riffle, MD  b complex vitamins capsule Take 1 capsule by mouth daily.   Yes [provider]  Cholecalciferol (VITAMIN D3) 50 MCG (2000 UT) CAPS Take 1 capsule by mouth daily.   Yes [provider]  clonazePAM (KLONOPIN) 0.5 MG tablet Take 0.5 mg by mouth 4 (four) times daily as needed for anxiety.   Yes [provider]  ezetimibe (ZETIA) 10 MG tablet Take 1 tablet (10 mg total) by mouth daily. Patient taking differently: Take 10 mg by mouth daily at 12 noon. Each morning 05/20/22  Yes Eugene Riffle, MD  finasteride (PROSCAR) 5 MG tablet Take 5 mg by mouth at bedtime.   Yes [provider]  fluvoxaMINE (LUVOX) 100 MG tablet Take 200 mg by mouth at bedtime.   Yes [provider]  lamoTRIgine (LAMICTAL) 200 MG tablet Take 200 mg by mouth at bedtime.   Yes [provider]  multivitamin (ONE-A-DAY MEN'S) TABS tablet Take 1 tablet by mouth at bedtime. 12/03/21  Yes [provider]  rosuvastatin (CRESTOR) 20 MG tablet Take 1 tablet (20 mg total) by mouth daily. Patient taking differently: Take 20 mg by mouth at bedtime. 01/05/23  Yes Eugene Riffle, MD  [facility] XL 150 MG 24  hr tablet Take 150 mg by mouth every other day. 07/16/22  Yes [provider]  bismuth subsalicylate (PEPTO BISMOL) 262 MG/15ML suspension Take 30 mLs by mouth every 6 (six) hours as needed for indigestion or diarrhea or loose stools. Patient not taking: Reported on 04/21/2023    [provider]  PREVIDENT 5000 BOOSTER PLUS 1.1 % PSTE Place 1 Application onto teeth daily. 11/18/22   [provider]  sildenafil (VIAGRA) 50 MG tablet Take 1 tablet (50 mg total) by mouth daily as needed for erectile dysfunction (Use 1 hour before sexual activity.). Patient not taking: Reported on 04/21/2023 01/12/23   Eugene Riffle, MD     Current Outpatient Medications  Medication Sig Dispense Refill   amLODipine (NORVASC) 2.5 MG tablet Take 1 tablet (2.5 mg total) by mouth daily. 90 tablet 3   aspirin EC 81 MG tablet Take 1 tablet (81 mg total) by mouth daily. Swallow whole. 90 tablet 3   b complex vitamins capsule Take 1 capsule by mouth daily.     Cholecalciferol (VITAMIN D3) 50 MCG (2000 UT) CAPS Take 1 capsule by mouth daily.     clonazePAM (KLONOPIN) 0.5 MG tablet Take 0.5 mg by mouth 4 (four) times daily as needed for anxiety.     ezetimibe (ZETIA) 10 MG tablet Take 1 tablet (10 mg total) by mouth daily. (Patient taking differently: Take 10 mg by mouth daily at 12 noon. Each morning) 90 tablet 3   finasteride (PROSCAR) 5 MG tablet Take 5 mg by mouth at bedtime.     fluvoxaMINE (LUVOX) 100 MG tablet Take 200 mg by mouth at bedtime.     lamoTRIgine (LAMICTAL) 200 MG tablet Take 200 mg by mouth at bedtime.     multivitamin (ONE-A-DAY MEN'S) TABS tablet Take 1 tablet by mouth at bedtime.     rosuvastatin (CRESTOR) 20 MG tablet Take 1 tablet (20 mg total) by mouth daily. (Patient taking differently: Take 20 mg by mouth at bedtime.) 90 tablet 3   WELLBUTRIN XL 150 MG 24 hr tablet Take 150 mg by mouth every other day.     bismuth subsalicylate (PEPTO BISMOL) 262 MG/15ML suspension Take 30 mLs by mouth every 6 (six) hours as needed for indigestion or diarrhea or loose stools. (Patient not taking: Reported on 04/21/2023)     PREVIDENT 5000 BOOSTER PLUS 1.1 % PSTE Place 1 Application onto teeth daily.     sildenafil (VIAGRA) 50 MG tablet Take 1 tablet (50 mg total) by mouth daily as needed for erectile dysfunction (Use 1 hour before sexual activity.). (Patient not taking: Reported on 04/21/2023) 30 tablet 2   Current Facility-Administered Medications  Medication Dose Route Frequency Provider Last Rate Last Admin   0.9 %  sodium chloride infusion  500 mL Intravenous Continuous Eugene Guzman, Eugene Rayas, MD        Allergies as of  06/01/2023 - Review Complete 06/01/2023  Allergen Reaction Noted   Levofloxacin  12/28/2022   Penicillins Other (See Comments) 09/06/2013   Latex Rash 04/28/2022    Family History  Problem Relation Age of Onset   Hypertension Mother    Cancer Mother    Hearing loss Mother    Heart attack Father 52   Arthritis Father    Depression Father    Heart disease Father    Depression Paternal Uncle    Depression Paternal Uncle    Ovarian cancer Maternal Grandmother    Cancer Maternal Grandmother    Hearing loss Maternal  Grandfather    Heart disease Maternal Grandfather    Hypertension Maternal Grandfather    Stroke Maternal Grandfather    Colon cancer Paternal Grandfather 67   Cancer Paternal Grandfather    Esophageal cancer Neg Hx    Rectal cancer Neg Hx    Stomach cancer Neg Hx     Social History   Socioeconomic History   Marital status: Married    Spouse name: Claris Che   Number of children: 0   Years of education: Not on file   Highest education level: Bachelor's degree (e.g., BA, AB, BS)  Occupational History   Occupation: retired  Tobacco Use   Smoking status: Former    Current packs/day: 0.00    Average packs/day: 0.3 packs/day for 30.0 years (7.5 ttl pk-yrs)    Types: Cigarettes    Quit date: 01/04/2015    Years since quitting: 8.4    Passive exposure: Never   Smokeless tobacco: Never  Vaping Use   Vaping status: Never Used  Substance and Sexual Activity   Alcohol use: Yes    Alcohol/week: 12.0 standard drinks of alcohol    Types: 12 Cans of beer per week    Comment: weekly   Drug use: No   Sexual activity: Yes    Birth control/protection: None  Other Topics Concern   Not on file  Social History Narrative   Lives with wife   Right handed   Caffeine: ice tea daily   Social Determinants of Health   Financial Resource Strain: Low Risk  (11/05/2022)   Overall Financial Resource Strain (CARDIA)    Difficulty of Paying Living Expenses: Not hard at all  Food  Insecurity: No Food Insecurity (03/19/2023)   Hunger Vital Sign    Worried About Running Out of Food in the Last Year: Never true    Ran Out of Food in the Last Year: Never true  Transportation Needs: No Transportation Needs (03/19/2023)   PRAPARE - Administrator, Civil Service (Medical): No    Lack of Transportation (Non-Medical): No  Physical Activity: Insufficiently Active (11/05/2022)   Exercise Vital Sign    Days of Exercise per Week: 2 days    Minutes of Exercise per Session: 40 min  Stress: Stress Concern Present (11/05/2022)   [employer] of Occupational Health - Occupational Stress Questionnaire    Feeling of Stress : Very much  Social Connections: Moderately Isolated (11/05/2022)   Social Connection and Isolation Panel [NHANES]    Frequency of Communication with Friends and Family: More than three times a week    Frequency of Social Gatherings with Friends and Family: More than three times a week    Attends Religious Services: Never    Database administrator or Organizations: No    Attends Banker Meetings: Patient declined    Marital Status: Married  Catering manager Violence: Not At Risk (03/19/2023)   Humiliation, Afraid, Rape, and Kick questionnaire    Fear of Current or Ex-Partner: No    Emotionally Abused: No    Physically Abused: No    Sexually Abused: No    Review of Systems: All other review of systems negative except as mentioned in the HPI.  Physical Exam: Vital signs BP 131/72   Pulse 69   Temp (!) 97.4 F (36.3 C) (Temporal)   Ht 5\' 10"  (1.778 m)   Wt 172 lb (78 kg)   SpO2 96%   BMI 24.68 kg/m   General:   Alert,  Well-developed, pleasant and cooperative in NAD Lungs:  Clear throughout to auscultation.   Heart:  Regular rate and rhythm Abdomen:  Soft, nontender and nondistended.   Neuro/Psych:  Alert and cooperative. Normal mood and affect. A and O x 3  Harlin Rain, MD [facility]

## 2023-06-01 NOTE — Progress Notes (Signed)
Sedate, gd SR, tolerated procedure well, VSS, report to RN 

## 2023-06-01 NOTE — Op Note (Signed)
[facility] Patient Name: Eugene Guzman Procedure Date: 06/01/2023 11:15 AM MRN: [id number] Endoscopist: Viviann Spare P. Adela Lank , MD, [id number] Age: 67 Referring MD:  Date of Birth: 1955/12/04 Gender: Male Account #: [id number] Procedure:                Colonoscopy Indications:              High risk colon cancer surveillance: Personal                            history of colonic polyps - last exam Dr. Kinnie Scales                            2019 - 5 polyps removed Medicines:                Monitored Anesthesia Care Procedure:                Pre-Anesthesia Assessment:                           - Prior to the procedure, a History and Physical                            was performed, and patient medications and                            allergies were reviewed. The patient's tolerance of                            previous anesthesia was also reviewed. The risks                            and benefits of the procedure and the sedation                            options and risks were discussed with the patient.                            All questions were answered, and informed consent                            was obtained. Prior Anticoagulants: The patient has                            taken no anticoagulant or antiplatelet agents. ASA                            Grade Assessment: III - A patient with severe                            systemic disease. After reviewing the risks and                            benefits, the patient was deemed in satisfactory  condition to undergo the procedure.                           After obtaining informed consent, the colonoscope                            was passed under direct vision. Throughout the                            procedure, the patient's blood pressure, pulse, and                            oxygen saturations were monitored continuously. The                            CF [id number] #[id number] was  introduced through the anus                            and advanced to the the cecum, identified by                            appendiceal orifice and ileocecal valve. The                            colonoscopy was performed without difficulty. The                            patient tolerated the procedure well. The quality                            of the bowel preparation was adequate. The                            ileocecal valve, appendiceal orifice, and rectum                            were photographed. Scope In: 11:28:37 AM Scope Out: 11:55:37 AM Scope Withdrawal Time: 0 hours 22 minutes 11 seconds  Total Procedure Duration: 0 hours 27 minutes 0 seconds  Findings:                 The perianal and digital rectal examinations were                            normal.                           A diminutive polyp was found in the cecum. The                            polyp was flat. The polyp was removed with a cold                            snare. Resection and retrieval were complete.  A 25 to 30 mm polyp was found in the hepatic                            flexure. The polyp was flat and wrapped around the                            back side of a fold. Technically this was                            challenging to get good positioning for polypectomy                            and visualize in entirety given the right colon was                            restricted. The polyp was removed with a piecemeal                            technique using a cold snare. Resection and                            retrieval were complete. Area across from this                            lesion was tattooed with an injection of Spot                            (carbon black).                           A 3 to 4 mm polyp was found in the transverse                            colon. The polyp was flat. The polyp was removed                            with a cold snare.  Resection and retrieval were                            complete.                           Multiple small-mouthed diverticula were found in                            the entire colon.                           Internal hemorrhoids were found during retroflexion.                           The exam was otherwise without abnormality. Complications:            No immediate complications. Estimated blood loss:  Minimal. Estimated Blood Loss:     Estimated blood loss was minimal. Impression:               - One diminutive polyp in the cecum, removed with a                            cold snare. Resected and retrieved.                           - One 25 to 30 mm polyp at the hepatic flexure,                            removed piecemeal using a cold snare. Resected and                            retrieved. Tattooed.                           - One 3 to 4 mm polyp in the transverse colon,                            removed with a cold snare. Resected and retrieved.                           - Diverticulosis in the entire examined colon.                           - Internal hemorrhoids.                           - The examination was otherwise normal. Recommendation:           - Patient has a contact number available for                            emergencies. The signs and symptoms of potential                            delayed complications were discussed with the                            patient. Return to normal activities tomorrow.                            Written discharge instructions were provided to the                            patient.                           - Resume previous diet.                           - Continue present medications.                           -  Await pathology results. Viviann Spare P. Edessa Jakubowicz, MD 06/01/2023 12:02:23 PM This report has been signed electronically.

## 2023-06-01 NOTE — Patient Instructions (Addendum)
Recommendation:  - Patient has a contact number available for emergencies. The signs and symptoms of potential delayed complications were discussed with the patient. Return to normal activities tomorrow. Written discharge instructions were provided to the patient.  - Resume previous diet.  - Continue present medications.   - Await pathology results.   YOU HAD AN ENDOSCOPIC PROCEDURE TODAY AT THE [facility]:   Refer to the procedure report that was given to you for any specific questions about what was found during the examination.  If the procedure report does not answer your questions, please call your gastroenterologist to clarify.  If you requested that your care partner not be given the details of your procedure findings, then the procedure report has been included in a sealed envelope for you to review at your convenience later.  YOU SHOULD EXPECT: Some feelings of bloating in the abdomen. Passage of more gas than usual.  Walking can help get rid of the air that was put into your GI tract during the procedure and reduce the bloating. If you had a lower endoscopy (such as a colonoscopy or flexible sigmoidoscopy) you may notice spotting of blood in your stool or on the toilet paper. If you underwent a bowel prep for your procedure, you may not have a normal bowel movement for a few days.  Please Note:  You might notice some irritation and congestion in your nose or some drainage.  This is from the oxygen used during your procedure.  There is no need for concern and it should clear up in a day or so.  SYMPTOMS TO REPORT IMMEDIATELY:  Following lower endoscopy (colonoscopy or flexible sigmoidoscopy):  Excessive amounts of blood in the stool  Significant tenderness or worsening of abdominal pains  Swelling of the abdomen that is new, acute  Fever of 100F or higher   For urgent or emergent issues, a gastroenterologist can be reached at any hour by calling [phone]. Do not  use MyChart messaging for urgent concerns.    DIET:  We do recommend a small meal at first, but then you may proceed to your regular diet.  Drink plenty of fluids but you should avoid alcoholic beverages for 24 hours.  MEDICATIONS: Continue present medications.  Please see handouts given to you by your recovery nurse: Polyps, Diverticulosis, Hemorrhoids.  FOLLOW UP: Await pathology results.  Thank you for allowing [place] to provide for your healthcare needs today.  ACTIVITY:  You should plan to take it easy for the rest of today and you should NOT DRIVE or use heavy machinery until tomorrow (because of the sedation medicines used during the test).    FOLLOW UP: Our staff will call the number listed on your records the next business day following your procedure.  We will call around 7:15- 8:00 am to check on you and address any questions or concerns that you may have regarding the information given to you following your procedure. If we do not reach you, we will leave a message.     If any biopsies were taken you will be contacted by phone or by letter within the next 1-3 weeks.  Please call us at [phone] if you have not heard about the biopsies in 3 weeks.    SIGNATURES/CONFIDENTIALITY: You and/or your care partner have signed paperwork which will be entered into your electronic medical record.  These signatures attest to the fact that that the information above on your After Visit Summary has been reviewed  and is understood.  Full responsibility of the confidentiality of this discharge information lies with you and/or your care-partner.

## 2023-06-01 NOTE — Progress Notes (Signed)
Called to room to assist during endoscopic procedure.  Patient ID and intended procedure confirmed with present staff. Received instructions for my participation in the procedure from the performing physician.  

## 2023-06-02 ENCOUNTER — Ambulatory Visit: Payer: Medicare HMO | Admitting: Psychology

## 2023-06-02 ENCOUNTER — Ambulatory Visit: Payer: Medicare HMO | Admitting: Gastroenterology

## 2023-06-02 ENCOUNTER — Telehealth: Payer: Self-pay | Admitting: *Deleted

## 2023-06-02 DIAGNOSIS — F331 Major depressive disorder, recurrent, moderate: Secondary | ICD-10-CM

## 2023-06-02 DIAGNOSIS — Z23 Encounter for immunization: Secondary | ICD-10-CM | POA: Diagnosis not present

## 2023-06-02 DIAGNOSIS — F410 Panic disorder [episodic paroxysmal anxiety] without agoraphobia: Secondary | ICD-10-CM

## 2023-06-02 DIAGNOSIS — F329 Major depressive disorder, single episode, unspecified: Secondary | ICD-10-CM

## 2023-06-02 DIAGNOSIS — F4321 Adjustment disorder with depressed mood: Secondary | ICD-10-CM

## 2023-06-02 NOTE — Progress Notes (Signed)
[facility] Counselor Initial Adult Exam  Name: Eugene Guzman. Date: 06/02/2023 MRN: [id number] DOB: January 09, 1956 PCP: [name], MD  Time spent: 60 minutes  Guardian/Payee:  N/A    Paperwork requested: No   Reason for Visit /Presenting Problem: Symptoms of Depression, Grief and Panic   Mental Status Exam: Appearance:   Casual     Behavior:  Appropriate  Motor:  Normal  Speech/Language:   Clear and Coherent  Affect:  Appropriate  Mood:  depressed  Thought process:  normal  Thought content:    WNL  Sensory/Perceptual disturbances:    WNL  Orientation:  oriented to person, place, and situation  Attention:  Good  Concentration:  Good  Memory:  WNL  Fund of knowledge:   Good  Insight:    Good  Judgment:   Good  Impulse Control:  Good    Reported Symptoms:  sadness, powerlessness, anxiety/panic  Risk Assessment: Danger to Self:  No Self-injurious Behavior: No Danger to Others: No Duty to Warn:no Physical Aggression / Violence:No  Access to Firearms a concern: No  Gang Involvement:No  Patient / guardian was educated about steps to take if suicide or homicide risk level increases between visits: n/a While future psychiatric events cannot be accurately predicted, the patient does not currently require acute inpatient psychiatric care and does not currently meet [facility] involuntary commitment criteria.  Substance Abuse History: Current substance abuse: No     Past Psychiatric History:   Previous psychological history is significant for depression and panic Outpatient Providers:Lisa Polis History of Psych Hospitalization: No  Psychological Testing:  N/A    Abuse History:  Victim of: No.,  N/A    Report needed: No. Victim of Neglect:No. Perpetrator of  N/A   Witness / Exposure to  Domestic Violence: No   Protective Services Involvement: No  Witness to [employer] Violence:  No   Family History:  Family History  Problem Relation Age of Onset   Hypertension Mother    Cancer Mother    Hearing loss Mother    Heart attack Father 78   Arthritis Father    Depression Father    Heart disease Father    Depression Paternal Uncle    Depression Paternal Uncle    Ovarian cancer Maternal Grandmother    Cancer Maternal Grandmother    Hearing loss Maternal Grandfather    Heart disease Maternal Grandfather    Hypertension Maternal Grandfather    Stroke Maternal Grandfather    Colon cancer Paternal Grandfather 81   Cancer Paternal Grandfather    Esophageal cancer Neg Hx    Rectal cancer Neg Hx    Stomach cancer Neg Hx     Living situation: the patient lives with their spouse  Sexual Orientation: Straight  Relationship Status: married  Name of spouse / other:unknown If a parent, number of children / ages:none  Support Systems: N/A  Surveyor, quantity Stress:  No   Income/Employment/Disability: Neurosurgeon:  unknown  Educational History: Education: Risk manager: unknown  Any cultural differences that may affect / interfere with treatment:  not applicable   Recreation/Hobbies: unknown  Stressors: Loss of mother    Strengths: Supportive Relationships and Able to Communicate Effectively  Barriers:  unknown   Legal History: Pending legal issue / charges: The patient has no significant history of legal issues. History of legal issue / charges:  N/A  Medical History/Surgical History: reviewed Past Medical History:  Diagnosis Date   Allergy    childhood - penicillin, 2014 - latex   Anxiety    Aortic aneurysm (HCC)    Arthritis 2014   right shoulder   CLL (chronic lymphocytic leukemia) (HCC)    Coronary artery calcification seen on CT scan    Depressed    Hx of adenomatous polyp of colon  02/2018   Dr. Lianne Moris   Hyperlipidemia    Hypertension    Sleep apnea 2022   Have stopped using CPAP    Past Surgical History:  Procedure Laterality Date   CORONARY PRESSURE/FFR STUDY N/A 04/28/2022   Procedure: INTRAVASCULAR PRESSURE WIRE/FFR STUDY;  Surgeon: [name], MD;  Location: [facility] INVASIVE CV LAB;  Service: Cardiovascular;  Laterality: N/A;   LEFT HEART CATH AND CORONARY ANGIOGRAPHY N/A 04/28/2022   Procedure: LEFT HEART CATH AND CORONARY ANGIOGRAPHY;  Surgeon: [name], MD;  Location: [facility] INVASIVE CV LAB;  Service: Cardiovascular;  Laterality: N/A;   TONSILLECTOMY  1961   TRANSURETHRAL RESECTION OF PROSTATE N/A 03/19/2023   Procedure: TRANSURETHRAL RESECTION OF THE PROSTATE (TURP);  Surgeon: [name]., MD;  Location: [facility] ORS;  Service: Urology;  Laterality: N/A;    Medications: Current Outpatient Medications  Medication Sig Dispense Refill   amLODipine (NORVASC) 2.5 MG tablet Take 1 tablet (2.5 mg total) by mouth daily. 90 tablet 3   aspirin EC 81 MG tablet Take 1 tablet (81 mg total) by mouth daily. Swallow whole. 90 tablet 3   b complex vitamins capsule Take 1 capsule by mouth daily.     bismuth subsalicylate (PEPTO BISMOL) 262 MG/15ML suspension Take 30 mLs by mouth every 6 (six) hours as needed for indigestion or diarrhea or loose stools. (Patient not taking: Reported on 04/21/2023)     Cholecalciferol (VITAMIN D3) 50 MCG (2000 UT) CAPS Take 1 capsule by mouth daily.     clonazePAM (KLONOPIN) 0.5 MG tablet Take 0.5 mg by mouth 4 (four) times daily as needed for anxiety.     ezetimibe (ZETIA) 10 MG tablet Take 1 tablet (10 mg total) by mouth daily. (Patient taking differently: Take 10 mg by mouth daily at 12 noon. Each morning) 90 tablet 3   finasteride (PROSCAR) 5 MG tablet Take 5 mg by mouth at bedtime.     fluvoxaMINE (LUVOX) 100 MG tablet Take 200 mg by mouth at bedtime.     lamoTRIgine (LAMICTAL) 200 MG tablet Take 200 mg by mouth at bedtime.      multivitamin (ONE-A-DAY MEN'S) TABS tablet Take 1 tablet by mouth at bedtime.     PREVIDENT 5000 BOOSTER PLUS 1.1 % PSTE Place 1 Application onto teeth daily.     rosuvastatin (CRESTOR) 20 MG tablet Take 1 tablet (20 mg total) by mouth daily. (Patient taking differently: Take 20 mg by mouth at bedtime.) 90 tablet 3   sildenafil (VIAGRA) 50 MG tablet Take 1 tablet (50 mg total) by mouth daily as needed for erectile dysfunction (Use 1 hour before sexual activity.). (Patient not taking: Reported on 04/21/2023) 30 tablet 2  WELLBUTRIN XL 150 MG 24 hr tablet Take 150 mg by mouth every other day.     No current facility-administered medications for this visit.    Allergies  Allergen Reactions   Levofloxacin     Avoid fluoroquinolone antibiotics due to thoracic aortic aneurysm   Penicillins Other (See Comments)    Childhood   Latex Rash    Diagnoses:  Major Depression, Panic, Grief  Plan of Care: Outpatient Psychotherapy and medication management  Initial Note: He states he has taken psychotropic medication for years and sees Clayton Lefort at [facility]. States [place] and feels he has suffered depression most of his life. He only took vacation once a year and would get really depressed when it was over. First significant depression was at 33 when he had a break-up. Had more minor depressive episodes earlier in life. Change has always been difficult for him. Also has had severe panic attacks. First one he remembers was first day of his fifth year of college. He was distressed that "this was the end of his college career". He recalls having a panic when on business trip to [place] in a [employer]. He had to come home. 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 He was offered early retirement and had a panic when he realized that he just quit a job of 30 years and his life was changing. When he has panic, he doesn't have a fear of death,  rather it is a fear of something he can't control. His anxiety was less prevalent in his life than the depression.  He says "my major problem in life is depression and panic". His mother passed and he is struggling with her loss. She died 2 weeks ago at 45 and was a vibrant person for 92 years. In 1 year, she had rapid dementia. He says, "she was his best friend". He has a small farm down the road from where his mother lived. He says "I don't handle death well". His reason to seek therapy was before mother was deteriorating. He had tried therapy in the past and it was not helpful. Also, he has had medical challenges himself, but says it does not cause him distress. He has been on a number of different anti-depressants over the years.  He contacted me for his depression, ut says the grief is now most prevalent.  He has 1 sister in [place]. She is 3 years older and they have a good relationship, but she has kids and grandchildren and they keep her busy. He married at 59 and wife was 5. His wife will occasionally tell him she blames him for not having kids. He told her when they married that he was too old to have kids. He says it was his "inability to handle change". This is his only marriage for both he and his wife. Wife is from [place] and has 3 sisters and one brother. None of her family in this area. He left [employer] at 56 ("too early"). 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. Wife retired in 2013.  Will complete intake at next visit. Patient states he wants to continue treatment and prefers face to face when possible.   Goals/Tx. Plan: Patient states that he is seeking symptom relief from life long depressive episodes. First, however, he needs support and strategies to manage grief over the recent (2 weeks) loss of his  mother. Will engage in grief counseling to reduce current despair. In addition, treatment will address his anxiety that contributes to episodic panic attacks. This will  involve relaxation techniques and other behavioral strategies  to facilitate greater control over his anxiety. Patient agrees to plan with a goal date of 12-24.   Patient was seen face to face in Provider's office.  Session note: Patient states he had colonoscopy yesterday. One large pollop found and it was sent to pathology. He says that he woke up with panic last night and drank a beer and took medicine. He wife told him he has to stop or she will leave. He told her that we will work on sobriety in therapy. He did not keep a journal of his alcohol intake. He tells me "she will be okay for me to drink on special occasions. He says he could "easily" cut his alcohol in half, but the "other half" would be a challenge. He says that the alcohol is mostly, these days, to manage his grief. His drinking has been at same level past couple of weeks and he says that "it is mostly habit".  We discussed AA, and he says he is "kind of scared of it". Explained how the program works. He has some reluctance to strive toward total sobriety. Told him to ask himself, on a scale of 1-10, how bad he wants that beer. He feels the result will be much less alcohol consumption.                  Garrel Ridgel, PhD 10:40a-11:30a 50 minutes

## 2023-06-02 NOTE — Telephone Encounter (Signed)
  Follow up Call-     06/01/2023   10:22 AM  Call back number  Post procedure Call Back phone  # [phone]  Permission to leave phone message Yes     Patient questions:  Do you have a fever, pain , or abdominal swelling? No. Pain Score  0 *  Have you tolerated food without any problems? Yes.    Have you been able to return to your normal activities? Yes.    Do you have any questions about your discharge instructions: Diet   No. Medications  No. Follow up visit  No.  Do you have questions or concerns about your Care? No.  Actions: * If pain score is 4 or above: No action needed, pain <4.

## 2023-06-04 LAB — SURGICAL PATHOLOGY

## 2023-06-07 ENCOUNTER — Encounter: Payer: Self-pay | Admitting: Gastroenterology

## 2023-06-08 ENCOUNTER — Other Ambulatory Visit: Payer: Self-pay | Admitting: Internal Medicine

## 2023-06-12 NOTE — Progress Notes (Unsigned)
Cardiology Office Note   Date:  06/12/2023   ID:  Eugene Guzman., DOB 01-26-1956, MRN [id number]  PCP:  Eugene Sow, MD  Cardiologist:   Eugene Pates, MD   Patient presents for follow up of CAD    History of Present Illness: Eugene Guzman. is a 67 y.o. male  CAD.  Pt has a calcium score CT in 2021  score was 2118 (97th percentile for age)   Calcificatons noted in all coronary arteries.   He had a myoview done after that showed normal perfusion   In addition the CT showed asc aorta was dilated at 4.5 cm   he has been followed by Eugene Guzman with serial CT exams    The pt is also followed by psych for anxiety, depression In 2023 he went on to have a PET/CT cardiac stress test  This  showed normal perfuision     LVEF normal  No TID    Flow reserve was down a little 1.88   With the abnormal flow reserve and severe elevation of calcium he had LHC  This showed moderae CAD (60% LAD with FFR being insignificant)  LVEF normal   LVEDP normal    Pt on amldipoine already   I saw the pt  in clinic in Dec 2023  Since then he has been diagnosed with CLL   Follows with Eugene Guzman   may seek a second opinion Has a relative with CLL who goes to [facility]     He denies CP   Breathing is fair   he is vary anxious   Mother is also ill     He does not do much     Being evaluated for urinary retention   has appt with Eugene Berneice Heinrich soon    Placed on Proscar and Flomax   Notes occasional dizziness   I last saw the pt in clinic in June 2024  No outpatient medications have been marked as taking for the 06/15/23 encounter (Appointment) with Pricilla Riffle, MD.     Allergies:   Levofloxacin, Penicillins, and Latex   Past Medical History:  Diagnosis Date   Allergy    childhood - penicillin, 2014 - latex   Anxiety    Aortic aneurysm (HCC)    Arthritis 2014   right shoulder   CLL (chronic lymphocytic leukemia) (HCC)    Coronary artery calcification seen on CT scan    Depressed    Hx of  adenomatous polyp of colon 02/2018   Eugene. Lianne Moris   Hyperlipidemia    Hypertension    Sleep apnea 2022   Have stopped using CPAP    Past Surgical History:  Procedure Laterality Date   CORONARY PRESSURE/FFR STUDY N/A 04/28/2022   Procedure: INTRAVASCULAR PRESSURE WIRE/FFR STUDY;  Surgeon: [name], MD;  [facility]: [facility] INVASIVE CV LAB;  Service: Cardiovascular;  Laterality: N/A;   LEFT HEART CATH AND CORONARY ANGIOGRAPHY N/A 04/28/2022   Procedure: LEFT HEART CATH AND CORONARY ANGIOGRAPHY;  Surgeon: [name], MD;  Location: [facility] INVASIVE CV LAB;  Service: Cardiovascular;  Laterality: N/A;   TONSILLECTOMY  1961   TRANSURETHRAL RESECTION OF PROSTATE N/A 03/19/2023   Procedure: TRANSURETHRAL RESECTION OF THE PROSTATE (TURP);  Surgeon: [name]., MD;  Location: [facility] ORS;  Service: Urology;  Laterality: N/A;     Social History:  The patient  reports that he quit smoking about 8 years ago. His smoking use included cigarettes. He  has a 7.5 pack-year smoking history. He has never been exposed to tobacco smoke. He has never used smokeless tobacco. He reports current alcohol use of about 12.0 standard drinks of alcohol per week. He reports that he does not use drugs.   Family History:  The patient's family history includes Arthritis in his father; Cancer in his maternal grandmother, mother, and paternal grandfather; Colon cancer (age of onset: 46) in his paternal grandfather; Depression in his father, paternal uncle, and paternal uncle; Hearing loss in his maternal grandfather and mother; Heart attack (age of onset: 28) in his father; Heart disease in his father and maternal grandfather; Hypertension in his maternal grandfather and mother; Ovarian cancer in his maternal grandmother; Stroke in his maternal grandfather.    ROS:  Please see the history of present illness. All other systems are reviewed and  Negative to the above problem except as noted.    PHYSICAL EXAM: VS:  There  were no vitals taken for this visit.  GEN:  Thin 67 yo in no acute distress  HEENT: normal  Neck: no JVD, no carotid bruit Cardiac: RRR; no murmur  No LE edema  Respiratory:  clear to auscultation  GI: soft, nontender, nondistended, + BS  No hepatomegaly  MS: no deformity Moving all extremities   Skin: warm and dry, no rash Neuro:  Strength and sensation are intact Psych: euthymic mood, full affect   EKG:  EKG not done today   Cardiac Cath   04/28/22  POST-OP DIAGNOSIS Correct findings on stress PET: No obstructive disease Moderate multivessel CAD, calcified: Mid LAD 60% (RFR 0.94-not significant, apical LAD RFR was still not significant-0.92.), ostial LCx 55%, mid RCA 55%. Normal LV function, normal LVEDP     RECOMMENDATIONS Continue to titrate medications for cardiac risk factor reduction Consider increasing calcium channel blocker for likely microvascular disease based on stress PET results.     Lipid Panel   Last lipds in May LDL 134  HDL 67  Trig 141   LDL in 1610 was 172      Component Value Date/Time   CHOL 157 01/20/2023 1042   CHOL 160 03/04/2021 1117   TRIG 47.0 01/20/2023 1042   HDL 110.50 01/20/2023 1042   HDL 92 03/04/2021 1117   CHOLHDL 1 01/20/2023 1042   VLDL 9.4 01/20/2023 1042   LDLCALC 37 01/20/2023 1042   LDLCALC 54 03/04/2021 1117      Wt Readings from Last 3 Encounters:  06/01/23 172 lb (78 kg)  04/21/23 172 lb (78 kg)  03/19/23 167 lb (75.8 kg)      ASSESSMENT AND PLAN:   1  CAD   Pt with severe coronary calcifications  Moderate CAD at cath  Follow   I am not convinced he is having angina    2   Thoracic aneurysm   Follows with S Hendrickson  Aorta stable at 4.5 this spring   3  HTN   BP is adequately controlled   4  HL    Excellent control  LDL 49   Follow on current regimen   5  Psych  Will inquire re counsellig   6  CLL   Seeing Eugene Guzman   Plan to watch, get labs    I have encouraged him to get to walking   I think he would  beenefit    I will set for follow up in the summer     Limit sugar and processed foods  Stay active   Current medicines are reviewed at length with the patient today.  The patient does not have concerns regarding medicines.  Signed, Eugene Pates, MD  06/12/2023 10:51 PM    [facility] [address] [place], [place], [place]  [address] Phone: [phone]; Fax: [phone]

## 2023-06-15 ENCOUNTER — Other Ambulatory Visit: Payer: Self-pay | Admitting: Internal Medicine

## 2023-06-15 ENCOUNTER — Encounter: Payer: Self-pay | Admitting: Internal Medicine

## 2023-06-15 ENCOUNTER — Ambulatory Visit: Payer: Medicare HMO | Attending: Internal Medicine | Admitting: Internal Medicine

## 2023-06-15 VITALS — BP 138/60 | HR 72 | Ht 70.0 in | Wt 180.2 lb

## 2023-06-15 DIAGNOSIS — I10 Essential (primary) hypertension: Secondary | ICD-10-CM

## 2023-06-15 DIAGNOSIS — Z09 Encounter for follow-up examination after completed treatment for conditions other than malignant neoplasm: Secondary | ICD-10-CM | POA: Diagnosis not present

## 2023-06-15 DIAGNOSIS — I2510 Atherosclerotic heart disease of native coronary artery without angina pectoris: Secondary | ICD-10-CM

## 2023-06-15 MED ORDER — AMLODIPINE BESYLATE 5 MG PO TABS: 5.0000 mg | ORAL_TABLET | Freq: Every day | ORAL | 3 refills | Status: DC

## 2023-06-15 NOTE — Patient Instructions (Signed)
Medication Instructions: Increase Amlodipine 5 mg daily   *If you need a refill on your cardiac medications before your next appointment, please call your pharmacy*   Lab Work:  If you have labs (blood work) drawn today and your tests are completely normal, you will receive your results only by: MyChart Message (if you have MyChart) OR A paper copy in the mail If you have any lab test that is abnormal or we need to change your treatment, we will call you to review the results.   Testing/Procedures:    Follow-Up: At [facility], you and your health needs are our priority.  As part of our continuing mission to provide you with exceptional heart care, we have created designated Provider Care Teams.  These Care Teams include your primary Cardiologist (physician) and Advanced Practice Providers (APPs -  Physician Assistants and Nurse Practitioners) who all work together to provide you with the care you need, when you need it.  We recommend signing up for the patient portal called "MyChart".  Sign up information is provided on this After Visit Summary.  MyChart is used to connect with patients for Virtual Visits (Telemedicine).  Patients are able to view lab/test results, encounter notes, upcoming appointments, etc.  Non-urgent messages can be sent to your provider as well.   To learn more about what you can do with MyChart, go to [email or web address].    Your next appointment: 6 months with Dr Tenny Craw

## 2023-06-16 ENCOUNTER — Ambulatory Visit: Payer: Medicare HMO | Admitting: Psychology

## 2023-06-16 DIAGNOSIS — F331 Major depressive disorder, recurrent, moderate: Secondary | ICD-10-CM

## 2023-06-16 DIAGNOSIS — R69 Illness, unspecified: Secondary | ICD-10-CM | POA: Diagnosis not present

## 2023-06-16 NOTE — Progress Notes (Signed)
[facility] Counselor Initial Adult Exam  Name: Eugene Guzman. Date: 06/16/2023 MRN: [id number] DOB: 30-Jun-1956 PCP: [name], MD  Time spent: 60 minutes  Guardian/Payee:  N/A    Paperwork requested: No   Reason for Visit /Presenting Problem: Symptoms of Depression, Grief and Panic   Mental Status Exam: Appearance:   Casual     Behavior:  Appropriate  Motor:  Normal  Speech/Language:   Clear and Coherent  Affect:  Appropriate  Mood:  depressed  Thought process:  normal  Thought content:    WNL  Sensory/Perceptual disturbances:    WNL  Orientation:  oriented to person, place, and situation  Attention:  Good  Concentration:  Good  Memory:  WNL  Fund of knowledge:   Good  Insight:    Good  Judgment:   Good  Impulse Control:  Good    Reported Symptoms:  sadness, powerlessness, anxiety/panic  Risk Assessment: Danger to Self:  No Self-injurious Behavior: No Danger to Others: No Duty to Warn:no Physical Aggression / Violence:No  Access to Firearms a concern: No  Gang Involvement:No  Patient / guardian was educated about steps to take if suicide or homicide risk level increases between visits: n/a While future psychiatric events cannot be accurately predicted, the patient does not currently require acute inpatient psychiatric care and does not currently meet [facility] involuntary commitment criteria.  Substance Abuse History: Current substance abuse: No     Past Psychiatric History:   Previous psychological history is significant for depression and panic Outpatient Providers:Lisa Polis History of Psych Hospitalization: No  Psychological Testing:  N/A    Abuse History:  Victim of: No.,  N/A    Report needed: No. Victim of Neglect:No. Perpetrator of   N/A   Witness / Exposure to Domestic Violence: No   Protective Services Involvement: No  Witness to [employer] Violence:  No   Family History:  Family History  Problem Relation Age of Onset   Hypertension Mother    Cancer Mother    Hearing loss Mother    Heart attack Father 88   Arthritis Father    Depression Father    Heart disease Father    Depression Paternal Uncle    Depression Paternal Uncle    Ovarian cancer Maternal Grandmother    Cancer Maternal Grandmother    Hearing loss Maternal Grandfather    Heart disease Maternal Grandfather    Hypertension Maternal Grandfather    Stroke Maternal Grandfather    Colon cancer Paternal Grandfather 21   Cancer Paternal Grandfather    Esophageal cancer Neg Hx    Rectal cancer Neg Hx    Stomach cancer Neg Hx     Living situation: the patient lives with their spouse  Sexual Orientation: Straight  Relationship Status: married  Name of spouse / other:unknown If a parent, number of children / ages:none  Support Systems: N/A  Financial Stress:  No   Income/Employment/Disability: Product manager  Military Service:  unknown  Educational History: Education: Risk manager: unknown  Any cultural differences that may affect / interfere with treatment:  not applicable   Recreation/Hobbies: unknown  Stressors: Loss of mother    Strengths: Supportive Relationships and Able to Communicate Effectively  Barriers:  unknown   Legal History: Pending legal issue / charges: The patient has no significant history of legal issues. History of legal issue / charges:  N/A  Medical History/Surgical History: reviewed Past Medical History:  Diagnosis Date   Allergy    childhood - penicillin, 2014 - latex   Anxiety    Aortic aneurysm (HCC)    Arthritis 2014   right shoulder   CLL (chronic lymphocytic leukemia) (HCC)    Coronary artery calcification seen on CT scan    Depressed    Hx  of adenomatous polyp of colon 02/2018   Dr. Lianne Moris   Hyperlipidemia    Hypertension    Sleep apnea 2022   Have stopped using CPAP    Past Surgical History:  Procedure Laterality Date   CORONARY PRESSURE/FFR STUDY N/A 04/28/2022   Procedure: INTRAVASCULAR PRESSURE WIRE/FFR STUDY;  Surgeon: [name], MD;  Location: [facility] INVASIVE CV LAB;  Service: Cardiovascular;  Laterality: N/A;   LEFT HEART CATH AND CORONARY ANGIOGRAPHY N/A 04/28/2022   Procedure: LEFT HEART CATH AND CORONARY ANGIOGRAPHY;  Surgeon: [name], MD;  Location: [facility] INVASIVE CV LAB;  Service: Cardiovascular;  Laterality: N/A;   TONSILLECTOMY  1961   TRANSURETHRAL RESECTION OF PROSTATE N/A 03/19/2023   Procedure: TRANSURETHRAL RESECTION OF THE PROSTATE (TURP);  Surgeon: [name]., MD;  Location: [facility];  Service: Urology;  Laterality: N/A;    Medications: Current Outpatient Medications  Medication Sig Dispense Refill   amLODipine (NORVASC) 5 MG tablet Take 1 tablet (5 mg total) by mouth daily. 90 tablet 3   aspirin EC 81 MG tablet Take 1 tablet (81 mg total) by mouth daily. Swallow whole. 90 tablet 3   b complex vitamins capsule Take 1 capsule by mouth daily.     bismuth subsalicylate (PEPTO BISMOL) 262 MG/15ML suspension Take 30 mLs by mouth every 6 (six) hours as needed for indigestion or diarrhea or loose stools. (Patient not taking: Reported on 04/21/2023)     Cholecalciferol (VITAMIN D3) 50 MCG (2000 UT) CAPS Take 1 capsule by mouth daily.     clonazePAM (KLONOPIN) 0.5 MG tablet Take 0.5 mg by mouth 4 (four) times daily as needed for anxiety.     ezetimibe (ZETIA) 10 MG tablet Take 1 tablet (10 mg total) by mouth daily. 30 tablet 0   finasteride (PROSCAR) 5 MG tablet Take 5 mg by mouth at bedtime.     fluvoxaMINE (LUVOX) 100 MG tablet Take 200 mg by mouth at bedtime.     lamoTRIgine (LAMICTAL) 200 MG tablet Take 200 mg by mouth at bedtime.     multivitamin (ONE-A-DAY MEN'S) TABS tablet Take 1  tablet by mouth at bedtime.     PREVIDENT 5000 BOOSTER PLUS 1.1 % PSTE Place 1 Application onto teeth daily.     rosuvastatin (CRESTOR) 20 MG tablet Take 1 tablet (20 mg total) by mouth daily. (Patient taking differently: Take 20 mg by mouth at bedtime.) 90 tablet 3   sildenafil (VIAGRA) 50 MG tablet Take 1 tablet (50 mg total) by mouth daily as needed for erectile dysfunction (Use 1 hour before sexual activity.). (Patient not taking: Reported on 04/21/2023) 30 tablet 2  WELLBUTRIN XL 150 MG 24 hr tablet Take 150 mg by mouth every other day.     No current facility-administered medications for this visit.    Allergies  Allergen Reactions   Levofloxacin     Avoid fluoroquinolone antibiotics due to thoracic aortic aneurysm   Penicillins Other (See Comments)    Childhood   Latex Rash    Diagnoses:  Major Depression, Panic, Grief  Plan of Care: Outpatient Psychotherapy and medication management  Initial Note: He states he has taken psychotropic medication for years and sees Clayton Lefort at [facility]. States [place] and feels he has suffered depression most of his life. He only took vacation once a year and would get really depressed when it was over. First significant depression was at 33 when he had a break-up. Had more minor depressive episodes earlier in life. Change has always been difficult for him. Also has had severe panic attacks. First one he remembers was first day of his fifth year of college. He was distressed that "this was the end of his college career". He recalls having a panic when on business trip to [place] in a [employer]. He had to come home. 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 He was offered early retirement and had a panic when he realized that he just quit a job of 30 years and his life was changing. When he has panic, he doesn't have a fear of death, rather it is a fear of something he can't control. His  anxiety was less prevalent in his life than the depression.  He says "my major problem in life is depression and panic". His mother passed and he is struggling with her loss. She died 2 weeks ago at 76 and was a vibrant person for 92 years. In 1 year, she had rapid dementia. He says, "she was his best friend". He has a small farm down the road from where his mother lived. He says "I don't handle death well". His reason to seek therapy was before mother was deteriorating. He had tried therapy in the past and it was not helpful. Also, he has had medical challenges himself, but says it does not cause him distress. He has been on a number of different anti-depressants over the years.  He contacted me for his depression, ut says the grief is now most prevalent.  He has 1 sister in [place]. She is 3 years older and they have a good relationship, but she has kids and grandchildren and they keep her busy. He married at 39 and wife was 64. His wife will occasionally tell him she blames him for not having kids. He told her when they married that he was too old to have kids. He says it was his "inability to handle change". This is his only marriage for both he and his wife. Wife is from [place] and has 3 sisters and one brother. None of her family in this area. He left [employer] at 1 ("too early"). 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. Wife retired in 2013.  Will complete intake at next visit. Patient states he wants to continue treatment and prefers face to face when possible.   Goals/Tx. Plan: Patient states that he is seeking symptom relief from life long depressive episodes. First, however, he needs support and strategies to manage grief over the recent (2 weeks) loss of his  mother. Will engage in grief counseling to reduce current despair. In addition, treatment will address his anxiety that contributes to episodic panic attacks. This will involve relaxation techniques and other behavioral  strategies  to facilitate greater control over his anxiety. Patient agrees to plan with a goal date of 12-25.   Patient was seen face to face in Provider's office.  Session note: Patient states he has moderately reduced his alcohol intake. He no longer drinks during the day, especially when working. After work is drinking 1 beer instead of 3. Once home, he now has about three beers instead of "5 or 6". On occasion, he will take a shot of liquor if he is having a panic attack. He says he is not yet quitting all together, but vastly improved. When in a bar watching a ball game, however, he will tend to drink much more (up to 6 beers). Says he is still struggling with mother's loss and her estate. His brother in law is taking the lead of dealing with mother's estate. He feels he has his wife "talked in to" moving on to mother's estate. [name] is a little overwhelmed by the concept of building a house. Not sure he can tolerated the associated stress. He is continuing to explore options. He is feeling physically worn down and is feeling physically fragile. His CLL diagnosis, along with prostate issues, aortic aneurism and grief all together keep him feeling paralyzed emotionally. Talked about the need for him to create a timeline and move his plans along. He has a hard time decision making and feeling like he can juggle building a home amd managing mother's estate finances. Told him to meet with wife about the project.                    Garrel Ridgel, PhD 10:40a-11:30a 50 minutes

## 2023-06-17 DIAGNOSIS — M79605 Pain in left leg: Secondary | ICD-10-CM | POA: Diagnosis not present

## 2023-06-17 DIAGNOSIS — M25562 Pain in left knee: Secondary | ICD-10-CM | POA: Diagnosis not present

## 2023-06-21 NOTE — Progress Notes (Deleted)
      [address]       [place] [address]             [phone]       

## 2023-06-21 NOTE — Progress Notes (Signed)
[address]       [place] [address]             [phone]    PCP is [name], MD Referring Provider is [name], MD Cardiologist: [name], MD  Chief Complaint: Ascending thoracic aortic aneurysm   HPI: This is a 67 year old male with a past medical history of hypertension, anxiety, depression, coronary artery disease, sleep apnea, dyslipidemia, remote tobacco abuse, a bicuspid aortic valve, and recently diagnosed CLL who on CT coronary calcium was found to have an incidental 4.5 cm ascending thoracic aortic aneurysm in June 2021. He has been followed by [facility] since then. He was last seen in June 2024 by my collegue and at that time, the ATAA measured 4.5 cm. His only complaint at that time was dizziness with standing. He presents to the office for 6 month surveillance of his ascending thoracic aortic aneurysm. He denies chest pain, pressure, or tightness. He does admit to having a difficult year-loss of mother, tree fell on barn, fell and hurt left knee which is still healing. He has a history of anxiety/depression and is now seeing a therapist.  Past Medical History:  Diagnosis Date   Allergy    childhood - penicillin, 2014 - latex   Anxiety    Aortic aneurysm (HCC)    Arthritis 2014   right shoulder   CLL (chronic lymphocytic leukemia) (HCC)    Coronary artery calcification seen on CT scan    Depressed    Hx of adenomatous polyp of colon 02/2018   Dr. Lianne Moris   Hyperlipidemia    Hypertension    Sleep apnea 2022   Have stopped using CPAP    Past Surgical History:  Procedure Laterality Date   CORONARY PRESSURE/FFR STUDY N/A 04/28/2022   Procedure: INTRAVASCULAR PRESSURE WIRE/FFR STUDY;  Surgeon: [name], MD;  Location: [facility] INVASIVE CV LAB;  Service: Cardiovascular;  Laterality: N/A;   LEFT HEART CATH AND CORONARY ANGIOGRAPHY N/A 04/28/2022   Procedure: LEFT HEART CATH AND CORONARY ANGIOGRAPHY;  Surgeon: [name],  MD;  Location: [facility] INVASIVE CV LAB;  Service: Cardiovascular;  Laterality: N/A;   TONSILLECTOMY  1961   TRANSURETHRAL RESECTION OF PROSTATE N/A 03/19/2023   Procedure: TRANSURETHRAL RESECTION OF THE PROSTATE (TURP);  Surgeon: [name]., MD;  Location: [facility];  Service: Urology;  Laterality: N/A;    Family History  Problem Relation Age of Onset   Hypertension Mother    Cancer Mother    Hearing loss Mother    Heart attack Father 44   Arthritis Father    Depression Father    Heart disease Father    Depression Paternal Uncle    Depression Paternal Uncle    Ovarian cancer Maternal Grandmother    Cancer Maternal Grandmother    Hearing loss Maternal Grandfather    Heart disease Maternal Grandfather    Hypertension Maternal Grandfather    Stroke Maternal Grandfather    Colon cancer Paternal Grandfather 23   Cancer Paternal Grandfather    Esophageal cancer Neg Hx    Rectal cancer Neg Hx    Stomach cancer Neg Hx     Social History Social History   Tobacco Use   Smoking status: Former    Current packs/day: 0.00    Average packs/day: 0.3 packs/day for 30.0 years (7.5 ttl pk-yrs)    Types: Cigarettes    Quit date: 01/04/2015  Years since quitting: 8.4    Passive exposure: Never   Smokeless tobacco: Never  Vaping Use   Vaping status: Never Used  Substance Use Topics   Alcohol use: Yes    Alcohol/week: 12.0 standard drinks of alcohol    Types: 12 Cans of beer per week    Comment: weekly   Drug use: No    Current Outpatient Medications  Medication Sig Dispense Refill   amLODipine (NORVASC) 5 MG tablet Take 1 tablet (5 mg total) by mouth daily. 90 tablet 3   aspirin EC 81 MG tablet Take 1 tablet (81 mg total) by mouth daily. Swallow whole. 90 tablet 3   b complex vitamins capsule Take 1 capsule by mouth daily.     bismuth subsalicylate (PEPTO BISMOL) 262 MG/15ML suspension Take 30 mLs by mouth every 6 (six) hours as needed for indigestion or diarrhea or loose stools.  (Patient not taking: Reported on 04/21/2023)     Cholecalciferol (VITAMIN D3) 50 MCG (2000 UT) CAPS Take 1 capsule by mouth daily.     clonazePAM (KLONOPIN) 0.5 MG tablet Take 0.5 mg by mouth 4 (four) times daily as needed for anxiety.     ezetimibe (ZETIA) 10 MG tablet Take 1 tablet (10 mg total) by mouth daily. 30 tablet 0   finasteride (PROSCAR) 5 MG tablet Take 5 mg by mouth at bedtime.     fluvoxaMINE (LUVOX) 100 MG tablet Take 200 mg by mouth at bedtime.     lamoTRIgine (LAMICTAL) 200 MG tablet Take 200 mg by mouth at bedtime.     multivitamin (ONE-A-DAY MEN'S) TABS tablet Take 1 tablet by mouth at bedtime.     PREVIDENT 5000 BOOSTER PLUS 1.1 % PSTE Place 1 Application onto teeth daily.     rosuvastatin (CRESTOR) 20 MG tablet Take 1 tablet (20 mg total) by mouth daily. (Patient taking differently: Take 20 mg by mouth at bedtime.) 90 tablet 3   sildenafil (VIAGRA) 50 MG tablet Take 1 tablet (50 mg total) by mouth daily as needed for erectile dysfunction (Use 1 hour before sexual activity.). (Patient not taking: Reported on 04/21/2023) 30 tablet 2   WELLBUTRIN XL 150 MG 24 hr tablet Take 150 mg by mouth every other day.      Allergies  Allergen Reactions   Levofloxacin     Avoid fluoroquinolone antibiotics due to thoracic aortic aneurysm   Penicillins Other (See Comments)    Childhood   Latex Rash   Vital Signs: Vitals:   07/05/23 1253  BP: 110/67  Pulse: 66  Resp: 20  SpO2: 96%      Physical Exam CV-RRR, Grade I/VI systolic murmur heard best along left sternal border Neck-No carotid bruit Pulmonary-Clear to auscultation bilaterally Abdomen-SOft, non tender, bowel sounds present Extremities-No LE edema Neurologic-Grossly intact without focal deficit  Diagnostic Tests: Narrative & Impression  CLINICAL DATA:  Re-evaluate ascending aortic aneurysm   EXAM: CT ANGIOGRAPHY CHEST WITH CONTRAST   TECHNIQUE: Multidetector CT imaging of the chest was performed using  the standard protocol during bolus administration of intravenous contrast. Multiplanar CT image reconstructions and MIPs were obtained to evaluate the vascular anatomy.   RADIATION DOSE REDUCTION: This exam was performed according to the departmental dose-optimization program which includes automated exposure control, adjustment of the mA and/or kV according to patient size and/or use of iterative reconstruction technique.   CONTRAST:  75mL ISOVUE-370 IOPAMIDOL (ISOVUE-370) INJECTION 76%   COMPARISON:  12/23/2022   FINDINGS: Cardiovascular: Aortic atherosclerosis. Ascending aortic dilatation  at maximally 4.4 cm on transverse image 61/4 versus similar on the prior exam (when remeasured). Based off coronal reformats, the aorta measures 4.0 cm at the sinuses, 3.6 cm at the sinotubular junction, and 4.4 cm in the mid ascending segment (coronal image 47). Similar to the prior (when remeasured). Normal caliber transverse aorta and great vessels. Normal heart size, without pericardial effusion. Three vessel coronary artery calcification. No central pulmonary embolism, on this non-dedicated study.   Mediastinum/Nodes: Multiple small low left jugular nodes are similar and likely reactive. The largest measures 11 mm on 20/4, similar to the most recent exam but increased from 7 mm on 06/24/2020.   Multiple left axillary nodes are also identified, none pathologic by size criteria.   No mediastinal or hilar adenopathy.   Lungs/Pleura: No pleural fluid.  Clear lungs.   Upper Abdomen: Marked hepatic steatosis. Mild caudate lobe enlargement. Mild prominence of the central hepatic fissures.   High right hepatic lobe hyperenhancing 1.3 cm lesion on 96/4 is similar. Normal imaged portions of the spleen, stomach, pancreas, gallbladder, adrenal glands, kidneys.   Musculoskeletal: Right glenohumeral joint degenerative change. Remote left rib fractures.   Review of the MIP images confirms the  above findings.   IMPRESSION: 1. Similar ascending aortic aneurysm at maximally 4.4 cm. Recommend annual imaging followup by CTA or MRA. This recommendation follows 2010 ACCF/AHA/AATS/ACR/ASA/SCA/SCAI/SIR/STS/SVM Guidelines for the Diagnosis and Management of Patients with Thoracic Aortic Disease. Circulation. 2010; 121: Z660-Y301. Aortic aneurysm NOS (ICD10-I71.9) 2. Prominent low left jugular and axillary nodes. The patient had a negative PET due to clinical concern of lymphoma on 11/06/2022. Therefore, these nodes are most likely reactive. 3. Hepatic steatosis. Hepatic morphology for which mild cirrhosis cannot be excluded. Probable perfusion anomaly within the high right hepatic lobe, present back to 2021. 4. Coronary artery atherosclerosis. Aortic Atherosclerosis (ICD10-I70.0).     Electronically Signed   By: Jeronimo Greaves M.D.   On: 06/24/2023 16:56   Impression and Plan: CTA with a 4.4 cm ascending aortic aneurysm.  Echocardiogram done in January 2023 showed a bicuspid valve with evidence of mild aortic regurgitation. I will defer to Dr. Tenny Craw when to repeat echocardiogram  (is not symptomatic from AI at this time). We discussed the natural history and and risk factors for growth of ascending aortic aneurysms.  We covered the importance of smoking cessation, tight blood pressure control, refraining from lifting heavy objects, and avoiding fluoroquinolones.  The patient is aware of signs and symptoms of aortic dissection and when to present to the emergency department.  We will continue surveillance and a repeat CTA was ordered for 6 months.  Ardelle Balls, PA-C [facility] [phone]

## 2023-06-24 ENCOUNTER — Ambulatory Visit
Admission: RE | Admit: 2023-06-24 | Discharge: 2023-06-24 | Disposition: A | Discharge status: Home / Self Care | Payer: Medicare HMO | Source: Ambulatory Visit | Attending: Thoracic Surgery (Cardiothoracic Vascular Surgery) | Admitting: Thoracic Surgery (Cardiothoracic Vascular Surgery)

## 2023-06-24 DIAGNOSIS — K769 Liver disease, unspecified: Secondary | ICD-10-CM | POA: Diagnosis not present

## 2023-06-24 DIAGNOSIS — I7121 Aneurysm of the ascending aorta, without rupture: Secondary | ICD-10-CM | POA: Diagnosis not present

## 2023-06-24 DIAGNOSIS — I700 Atherosclerosis of aorta: Secondary | ICD-10-CM | POA: Diagnosis not present

## 2023-06-24 DIAGNOSIS — R599 Enlarged lymph nodes, unspecified: Secondary | ICD-10-CM | POA: Diagnosis not present

## 2023-06-24 MED ORDER — IOPAMIDOL (ISOVUE-370) INJECTION 76%
200.0000 mL | Freq: Once | INTRAVENOUS | Status: AC | PRN
  Administered 2023-06-24: 75 mL via INTRAVENOUS

## 2023-06-28 DIAGNOSIS — R972 Elevated prostate specific antigen [PSA]: Secondary | ICD-10-CM | POA: Diagnosis not present

## 2023-06-28 DIAGNOSIS — R3915 Urgency of urination: Secondary | ICD-10-CM | POA: Diagnosis not present

## 2023-07-01 ENCOUNTER — Ambulatory Visit: Payer: Medicare HMO | Admitting: Psychology

## 2023-07-05 ENCOUNTER — Encounter: Payer: Self-pay | Admitting: Physician Assistant

## 2023-07-05 ENCOUNTER — Ambulatory Visit: Payer: Medicare HMO | Admitting: Physician Assistant

## 2023-07-05 VITALS — BP 110/67 | HR 66 | Resp 20 | Ht 70.0 in | Wt 181.0 lb

## 2023-07-05 DIAGNOSIS — I7121 Aneurysm of the ascending aorta, without rupture: Secondary | ICD-10-CM

## 2023-07-05 DIAGNOSIS — Q2381 Bicuspid aortic valve: Secondary | ICD-10-CM

## 2023-07-05 NOTE — Patient Instructions (Addendum)
Risk Modification in those with ascending thoracic aortic aneurysm:  Continue good control of blood pressure (prefer SBP 130/80 or less)-continue with Amlodipine (Norvasc)  2. Avoid fluoroquinolone antibiotics (I.e Ciprofloxacin, Avelox, Levofloxacin, Ofloxacin)  3.  Use of statin (to decrease cardiovascular risk)-continue with Rosuvastatin (Crestor) and Ezetimibe (Zetia)  4.  Exercise and activity limitations is individualized, but in general, contact sports are to be avoided and one should avoid heavy lifting (defined as half of ideal body weight) and exercises involving sustained Valsalva maneuver.  5. Counseling for those suspected of having genetically mediated disease. First-degree relatives of those with TAA disease should be screened as well as those who have a connective tissue disease (I.e with Marfan syndrome, Ehlers-Danlos syndrome,  and Loeys-Dietz syndrome) or a  bicuspid aortic valve,have an increased risk for  complications related to TAA. Patient does not have a family history of connective tissue disease;however, he does have a bicuspid aortic valve. His last echo was done in October 2023 and he had mild AI. He is followed by Dr. Tenny Craw  6.He has a remote history of tobacco abuse;quit 2016.

## 2023-07-08 ENCOUNTER — Ambulatory Visit: Payer: Medicare HMO | Admitting: Neurology

## 2023-07-13 ENCOUNTER — Encounter: Payer: Self-pay | Admitting: Neurology

## 2023-07-13 ENCOUNTER — Ambulatory Visit: Payer: Medicare Other | Admitting: Neurology

## 2023-07-13 VITALS — BP 140/71 | HR 65 | Ht 70.0 in | Wt 182.0 lb

## 2023-07-13 DIAGNOSIS — I2510 Atherosclerotic heart disease of native coronary artery without angina pectoris: Secondary | ICD-10-CM

## 2023-07-13 DIAGNOSIS — G4710 Hypersomnia, unspecified: Secondary | ICD-10-CM | POA: Diagnosis not present

## 2023-07-13 DIAGNOSIS — G4734 Idiopathic sleep related nonobstructive alveolar hypoventilation: Secondary | ICD-10-CM

## 2023-07-13 DIAGNOSIS — F410 Panic disorder [episodic paroxysmal anxiety] without agoraphobia: Secondary | ICD-10-CM

## 2023-07-13 DIAGNOSIS — G4730 Sleep apnea, unspecified: Secondary | ICD-10-CM

## 2023-07-13 DIAGNOSIS — I7121 Aneurysm of the ascending aorta, without rupture: Secondary | ICD-10-CM

## 2023-07-13 MED ORDER — TRAZODONE HCL 50 MG PO TABS: 50.0000 mg | ORAL_TABLET | Freq: Every day | ORAL | 5 refills | Status: DC
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
## 2023-07-13 NOTE — Patient Instructions (Signed)
 Healthy Living: Sleep In this video, you will learn why sleep is an important part of a healthy lifestyle. To view the content, go to this web address: [email or web address].[email or web address]  This video will expire on: 12/27/2024. If you need access to this video following this date, please reach out to the healthcare provider who assigned it to you. This information is not intended to replace advice given to you by your health care provider. Make sure you discuss any questions you have with your health care provider. Elsevier Patient Education  2024 Elsevier Inc. Insomnia Insomnia is a sleep disorder that makes it difficult to fall asleep or stay asleep. Insomnia can cause fatigue, low energy, difficulty concentrating, mood swings, and poor performance at work or school. There are three different ways to classify insomnia: Difficulty falling asleep. Difficulty staying asleep. Waking up too early in the morning. Any type of insomnia can be long-term (chronic) or short-term (acute). Both are common. Short-term insomnia usually lasts for 3 months or less. Chronic insomnia occurs at least three times a week for longer than 3 months. What are the causes? Insomnia may be caused by another condition, situation, or substance, such as: Having certain mental health conditions, such as anxiety and depression. Using caffeine, alcohol, tobacco, or drugs. Having gastrointestinal conditions, such as gastroesophageal reflux disease (GERD). Having certain medical conditions. These include: Asthma. Alzheimer's disease. Stroke. Chronic pain. An overactive thyroid  gland (hyperthyroidism). Other sleep disorders, such as restless legs syndrome and sleep apnea. Menopause. Sometimes, the cause of insomnia may not be known. What increases the risk? Risk factors for insomnia include: Gender. Females are affected more often than males. Age. Insomnia is more common as people get older. Stress and certain medical  and mental health conditions. Lack of exercise. Having an irregular work schedule. This may include working night shifts and traveling between different time zones. What are the signs or symptoms? If you have insomnia, the main symptom is having trouble falling asleep or having trouble staying asleep. This may lead to other symptoms, such as: Feeling tired or having low energy. Feeling nervous about going to sleep. Not feeling rested in the morning. Having trouble concentrating. Feeling irritable, anxious, or depressed. How is this diagnosed? This condition may be diagnosed based on: Your symptoms and medical history. Your health care provider may ask about: Your sleep habits. Any medical conditions you have. Your mental health. A physical exam. How is this treated? Treatment for insomnia depends on the cause. Treatment may focus on treating an underlying condition that is causing the insomnia. Treatment may also include: Medicines to help you sleep. Counseling or therapy. Lifestyle adjustments to help you sleep better. Follow these instructions at home: Eating and drinking  Limit or avoid alcohol, caffeinated beverages, and products that contain nicotine and tobacco, especially close to bedtime. These can disrupt your sleep. Do not eat a large meal or eat spicy foods right before bedtime. This can lead to digestive discomfort that can make it hard for you to sleep. Sleep habits  Keep a sleep diary to help you and your health care provider figure out what could be causing your insomnia. Write down: When you sleep. When you wake up during the night. How well you sleep and how rested you feel the next day. Any side effects of medicines you are taking. What you eat and drink. Make your bedroom a dark, comfortable place where it is easy to fall asleep. Put up shades or blackout curtains to block light from outside.  Use a white noise machine to block noise. Keep the temperature  cool. Limit screen use before bedtime. This includes: Not watching TV. Not using your smartphone, tablet, or computer. Stick to a routine that includes going to bed and waking up at the same times every day and night. This can help you fall asleep faster. Consider making a quiet activity, such as reading, part of your nighttime routine. Try to avoid taking naps during the day so that you sleep better at night. Get out of bed if you are still awake after 15 minutes of trying to sleep. Keep the lights down, but try reading or doing a quiet activity. When you feel sleepy, go back to bed. General instructions Take over-the-counter and prescription medicines only as told by your health care provider. Exercise regularly as told by your health care provider. However, avoid exercising in the hours right before bedtime. Use relaxation techniques to manage stress. Ask your health care provider to suggest some techniques that may work well for you. These may include: Breathing exercises. Routines to release muscle tension. Visualizing peaceful scenes. Make sure that you drive carefully. Do not drive if you feel very sleepy. Keep all follow-up visits. This is important. Contact a health care provider if: You are tired throughout the day. You have trouble in your daily routine due to sleepiness. You continue to have sleep problems, or your sleep problems get worse. Get help right away if: You have thoughts about hurting yourself or someone else. Get help right away if you feel like you may hurt yourself or others, or have thoughts about taking your own life. Go to your nearest emergency room or: Call 911. Call the National Suicide Prevention Lifeline at 724 749 9630 or 988. This is open 24 hours a day. Text the Crisis Text Line at 731 361 6580. Summary Insomnia is a sleep disorder that makes it difficult to fall asleep or stay asleep. Insomnia can be long-term (chronic) or short-term (acute). Treatment  for insomnia depends on the cause. Treatment may focus on treating an underlying condition that is causing the insomnia. Keep a sleep diary to help you and your health care provider figure out what could be causing your insomnia. This information is not intended to replace advice given to you by your health care provider. Make sure you discuss any questions you have with your health care provider. Document Revised: 06/02/2021 Document Reviewed: 06/02/2021 Elsevier Patient Education  2024 [employer].

## 2023-07-14 ENCOUNTER — Telehealth: Payer: Self-pay

## 2023-07-14 DIAGNOSIS — Z87891 Personal history of nicotine dependence: Secondary | ICD-10-CM | POA: Diagnosis not present

## 2023-07-14 DIAGNOSIS — R7401 Elevation of levels of liver transaminase levels: Secondary | ICD-10-CM

## 2023-07-14 DIAGNOSIS — Z20822 Contact with and (suspected) exposure to covid-19: Secondary | ICD-10-CM | POA: Diagnosis not present

## 2023-07-14 DIAGNOSIS — C9110 Chronic lymphocytic leukemia of B-cell type not having achieved remission: Secondary | ICD-10-CM | POA: Diagnosis not present

## 2023-07-14 NOTE — Telephone Encounter (Signed)
-----   Message from [facility] [place] [facility] sent at 04/21/2023  2:29 PM EDT ----- Regarding: due for LFTs Patient will be due for LFTs in january

## 2023-07-14 NOTE — Telephone Encounter (Signed)
MyChart message to patient to go to the lab 

## 2023-07-19 DIAGNOSIS — K080 Exfoliation of teeth due to systemic causes: Secondary | ICD-10-CM | POA: Diagnosis not present

## 2023-07-19 NOTE — Telephone Encounter (Signed)
 Spoke to patient to remind him to have labwork either this week or next. He verbalizes understanding.

## 2023-07-20 DIAGNOSIS — M1711 Unilateral primary osteoarthritis, right knee: Secondary | ICD-10-CM | POA: Diagnosis not present

## 2023-07-21 ENCOUNTER — Ambulatory Visit: Payer: Medicare Other | Admitting: Psychology

## 2023-07-21 ENCOUNTER — Other Ambulatory Visit (INDEPENDENT_AMBULATORY_CARE_PROVIDER_SITE_OTHER): Payer: Medicare Other

## 2023-07-21 ENCOUNTER — Encounter: Payer: Self-pay | Admitting: Gastroenterology

## 2023-07-21 DIAGNOSIS — F4321 Adjustment disorder with depressed mood: Secondary | ICD-10-CM | POA: Diagnosis not present

## 2023-07-21 DIAGNOSIS — R7401 Elevation of levels of liver transaminase levels: Secondary | ICD-10-CM | POA: Diagnosis not present

## 2023-07-21 DIAGNOSIS — F410 Panic disorder [episodic paroxysmal anxiety] without agoraphobia: Secondary | ICD-10-CM | POA: Diagnosis not present

## 2023-07-21 DIAGNOSIS — F331 Major depressive disorder, recurrent, moderate: Secondary | ICD-10-CM | POA: Diagnosis not present

## 2023-07-21 LAB — HEPATIC FUNCTION PANEL
ALT: 59 U/L — ABNORMAL HIGH (ref 0–53)
AST: 57 U/L — ABNORMAL HIGH (ref 0–37)
Albumin: 4.9 g/dL (ref 3.5–5.2)
Alkaline Phosphatase: 95 U/L (ref 39–117)
Bilirubin, Direct: 0.2 mg/dL (ref 0.0–0.3)
Total Bilirubin: 0.5 mg/dL (ref 0.2–1.2)
Total Protein: 7.8 g/dL (ref 6.0–8.3)

## 2023-07-21 NOTE — Progress Notes (Signed)
 [facility] Counselor Initial Adult Exam  Name: Eugene Guzman. Date: 07/21/2023 MRN: [id number] DOB: [birth year] PCP: Christel Cousins, MD  Time spent: 60 minutes  Guardian/Payee:  N/A    Paperwork requested: No   Reason for Visit /Presenting Problem: Symptoms of Depression, Grief and Panic   Mental Status Exam: Appearance:   Casual     Behavior:  Appropriate  Motor:  Normal  Speech/Language:   Clear and Coherent  Affect:  Appropriate  Mood:  depressed  Thought process:  normal  Thought content:    WNL  Sensory/Perceptual disturbances:    WNL  Orientation:  oriented to person, place, and situation  Attention:  Good  Concentration:  Good  Memory:  WNL  Fund of knowledge:   Good  Insight:    Good  Judgment:   Good  Impulse Control:  Good    Reported Symptoms:  sadness, powerlessness, anxiety/panic  Risk Assessment: Danger to Self:  No Self-injurious Behavior: No Danger to Others: No Duty to Warn:no Physical Aggression / Violence:No  Access to Firearms a concern: No  Gang Involvement:No  Patient / guardian was educated about steps to take if suicide or homicide risk level increases between visits: n/a While future psychiatric events cannot be accurately predicted, the patient does not currently require acute inpatient psychiatric care and does not currently meet [place]  involuntary commitment criteria.  Substance Abuse History: Current substance abuse: No     Past Psychiatric History:   Previous psychological history is significant for depression and panic Outpatient Providers:Lisa Polis History of Psych Hospitalization: No  Psychological Testing:  N/A    Abuse History:  Victim of: No.,  N/A    Report needed: No. Victim of  Neglect:No. Perpetrator of  N/A   Witness / Exposure to Domestic Violence: No   Protective Services Involvement: No  Witness to [employer] Violence:  No   Family History:  Family History  Problem Relation Age of Onset   Hypertension Mother    Cancer Mother    Hearing loss Mother    Heart attack Father 88   Arthritis Father    Depression Father    Heart disease Father    Depression Paternal Uncle    Depression Paternal Uncle    Ovarian cancer Maternal Grandmother    Cancer Maternal Grandmother    Hearing loss Maternal Grandfather    Heart disease Maternal Grandfather    Hypertension Maternal Grandfather    Stroke Maternal Grandfather    Colon cancer Paternal Grandfather 93   Cancer Paternal Grandfather    Esophageal cancer Neg Hx    Rectal cancer Neg Hx    Stomach cancer Neg Hx     Living situation: the patient lives with their spouse  Sexual Orientation: Straight  Relationship Status: married  Name of spouse / other:unknown If a parent, number of children / ages:none  Support Systems: N/A  Financial Stress:  No   Income/Employment/Disability: Neurosurgeon:  unknown  Educational History: Education: Risk manager: unknown  Any cultural differences that may affect / interfere with treatment:  not applicable   Recreation/Hobbies: unknown  Stressors: Loss of mother    Strengths: Supportive Relationships and Able to Communicate Effectively  Barriers:  unknown   Legal History: Pending legal issue / charges: The patient has no significant history of legal issues. History of legal issue / charges:  N/A  Medical History/Surgical History: reviewed Past Medical History:  Diagnosis Date   Allergy    childhood - penicillin, 2014 - latex   Anxiety    Aortic aneurysm (HCC)    Arthritis 2014   right shoulder   CLL (chronic lymphocytic leukemia) (HCC)    Coronary artery calcification seen on  CT scan    Depressed    Hx of adenomatous polyp of colon 02/2018   Dr. Sharol Decamp   Hyperlipidemia    Hypertension    Sleep apnea 2022   Have stopped using CPAP    Past Surgical History:  Procedure Laterality Date   CORONARY PRESSURE/FFR STUDY N/A 04/28/2022   Procedure: INTRAVASCULAR PRESSURE WIRE/FFR STUDY;  Surgeon: Arleen Lacer, MD;  Location: [facility] INVASIVE CV LAB;  Service: Cardiovascular;  Laterality: N/A;   LEFT HEART CATH AND CORONARY ANGIOGRAPHY N/A 04/28/2022   Procedure: LEFT HEART CATH AND CORONARY ANGIOGRAPHY;  Surgeon: Arleen Lacer, MD;  Location: [facility] INVASIVE CV LAB;  Service: Cardiovascular;  Laterality: N/A;   TONSILLECTOMY  1961   TRANSURETHRAL RESECTION OF PROSTATE N/A 03/19/2023   Procedure: TRANSURETHRAL RESECTION OF THE PROSTATE (TURP);  Surgeon: Melody Spurling., MD;  Location: [facility] ORS;  Service: Urology;  Laterality: N/A;    Medications: Current Outpatient Medications  Medication Sig Dispense Refill   amLODipine  (NORVASC ) 5 MG tablet Take 1 tablet (5 mg total) by mouth daily. 90 tablet 3   aspirin  EC 81 MG tablet Take 1 tablet (81 mg total) by mouth daily. Swallow whole. 90 tablet 3   b complex vitamins capsule Take 1 capsule by mouth daily.     bismuth subsalicylate (PEPTO BISMOL) 262 MG/15ML suspension Take 30 mLs by mouth every 6 (six) hours as needed for indigestion or diarrhea or loose stools. (Patient not taking: Reported on 07/13/2023)     Cholecalciferol (VITAMIN D3) 50 MCG (2000 UT) CAPS Take 1 capsule by mouth daily.     clonazePAM  (KLONOPIN ) 0.5 MG tablet Take 0.5 mg by mouth 4 (four) times daily as needed for anxiety.     ezetimibe  (ZETIA ) 10 MG tablet Take 1 tablet (10 mg total) by mouth daily. 30 tablet 0   fluvoxaMINE  (LUVOX ) 100 MG tablet Take 200 mg by mouth at bedtime.     lamoTRIgine  (LAMICTAL ) 200 MG tablet Take 200 mg by mouth at bedtime.     multivitamin (ONE-A-DAY MEN'S) TABS tablet Take 1 tablet by mouth at bedtime.     PREVIDENT  5000 BOOSTER PLUS 1.1 % PSTE Place 1 Application onto teeth daily.     rosuvastatin  (CRESTOR ) 20 MG tablet Take 1 tablet (20 mg total) by mouth daily. (Patient taking differently: Take 20 mg by mouth at bedtime.) 90 tablet 3   sildenafil  (VIAGRA ) 50 MG tablet Take 1 tablet (50 mg total) by mouth daily as needed for erectile dysfunction (Use 1 hour before sexual activity.). 30 tablet 2   traZODone  (DESYREL ) 50 MG tablet Take  1 tablet (50 mg total) by mouth at bedtime. 30 tablet 5   WELLBUTRIN XL 150 MG 24 hr tablet Take 150 mg by mouth every other day.     No current facility-administered medications for this visit.    Allergies  Allergen Reactions   Levofloxacin     Avoid fluoroquinolone antibiotics due to thoracic aortic aneurysm   Penicillins Other (See Comments)    Childhood   Quinolones     Patient with ATAA (ascending thoracic aortic aneurysm)   Latex Rash    Diagnoses:  Major Depression, Panic, Grief  Plan of Care: Outpatient Psychotherapy and medication management  Initial Note: He states he has taken psychotropic medication for years and sees Addison Holster at [facility]. States [place] and feels he has suffered depression most of his life. He only took vacation once a year and would get really depressed when it was over. First significant depression was at 33 when he had a break-up. Had more minor depressive episodes earlier in life. Change has always been difficult for him. Also has had severe panic attacks. First one he remembers was first day of his fifth year of college. He was distressed that "this was the end of his college career". He recalls having a panic when on business trip to [place] in a [employer]. He had to come home. 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 He was offered early retirement and had a panic when he realized that he just quit a job of 30 years and his life was changing. When he has panic, he  doesn't have a fear of death, rather it is a fear of something he can't control. His anxiety was less prevalent in his life than the depression.  He says "my major problem in life is depression and panic". His mother passed and he is struggling with her loss. She died 2 weeks ago at 10 and was a vibrant person for 92 years. In 1 year, she had rapid dementia. He says, "she was his best friend". He has a small farm down the road from where his mother lived. He says "I don't handle death well". His reason to seek therapy was before mother was deteriorating. He had tried therapy in the past and it was not helpful. Also, he has had medical challenges himself, but says it does not cause him distress. He has been on a number of different anti-depressants over the years.  He contacted me for his depression, ut says the grief is now most prevalent.  He has 1 sister in [place]. She is 3 years older and they have a good relationship, but she has kids and grandchildren and they keep her busy. He married at 15 and wife was 30. His wife will occasionally tell him she blames him for not having kids. He told her when they married that he was too old to have kids. He says it was his "inability to handle change". This is his only marriage for both he and his wife. Wife is from [place]  and has 3 sisters and one brother. None of her family in this area. He left [employer] at 55 ("too early"). 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. Wife retired in 2013.  Will complete intake at next visit. Patient states he wants to continue treatment and prefers face to face when possible.   Goals/Tx. Plan: Patient states that  he is seeking symptom relief from life long depressive episodes. First, however, he needs support and strategies to manage grief over the recent (2 weeks) loss of his mother. Will engage in grief counseling to reduce current despair. In addition, treatment will address his anxiety that contributes to  episodic panic attacks. This will involve relaxation techniques and other behavioral strategies  to facilitate greater control over his anxiety. Patient agrees to plan with a goal date of 12-25.   Patient was seen face to face in Provider's office.  Session note: Patient states he is continuing to try to reduce alcohol consumption. He feels it will be a tough year and isn't sure he will be able to reduce or stop. His goal is beers before leaving work and one upon getting home. His niece has made offer on house. Allen Israel feels it is "an almost impossible task to unpack that house. He feels it will take 4-6 months.  His wife wants him to get the name of an alcohol rehab in case he "needs it". She is not confident he can sustain reduction of alcohol intake. I suggested that he consider AA, but he says "I don't know how I would fit it in to my schedule. Told him his priority needs to be alcohol reduction. He claims he is focused on his self care (cancer doctor, orthopedic, cardiologist and me).             We discussed what it will take for him to start with AA or, alternatively, get evaluated at [facility]. He has a friend who goes to AA. Allen Israel will talk to him to get the story on AA.      Jola Nash, PhD 10:40a-11:30a 50 minutes

## 2023-07-22 ENCOUNTER — Ambulatory Visit: Payer: Medicare HMO | Admitting: Neurology

## 2023-07-27 ENCOUNTER — Telehealth (INDEPENDENT_AMBULATORY_CARE_PROVIDER_SITE_OTHER): Payer: Medicare Other | Admitting: Internal Medicine

## 2023-07-27 ENCOUNTER — Encounter: Payer: Self-pay | Admitting: Internal Medicine

## 2023-07-27 ENCOUNTER — Telehealth: Payer: Self-pay | Admitting: Gastroenterology

## 2023-07-27 DIAGNOSIS — U071 COVID-19: Secondary | ICD-10-CM | POA: Diagnosis not present

## 2023-07-27 MED ORDER — NIRMATRELVIR/RITONAVIR (PAXLOVID) TABLET (RENAL DOSING): 2.0000 | ORAL_TABLET | Freq: Two times a day (BID) | ORAL | 0 refills | Status: AC

## 2023-07-27 NOTE — Telephone Encounter (Signed)
Copied from CRM [id number]. Topic: Clinical - Medication Question >> Jul 27, 2023 10:27 AM Eugene Guzman wrote: Reason for CRM: Pt states that he tested positive for COVID and wants to know if he could be prescribed Paxlovid

## 2023-07-27 NOTE — Telephone Encounter (Signed)
Called and spoke with patient. He will be scheduled for a follow up with Dr. Adela Lank on Monday, 08/09/23 at 9:50 am. Pt is aware that is appt is on a 7 day hold. It will reflect and update in his MyChart on 08/02/23 once appt is released for scheduling.

## 2023-07-27 NOTE — Progress Notes (Signed)
Virtual Visit via Video Note  I connected with Eugene Guzman. on 07/27/23 at  1:40 PM EST by a video enabled telemedicine application and verified that I am speaking with the correct person using two identifiers.   I discussed the limitations of evaluation and management by telemedicine and the availability of in person appointments. The patient expressed understanding and agreed to proceed.  I was located at our [facility] office. The patient was at home. There was no one else present in the visit.  Chief Complaint  Patient presents with   Covid Positive     History of Present Illness:  C/o ST, not well x 2 d. COVID(+) this am Review of Systems  Constitutional:  Positive for chills and malaise/fatigue. Negative for fever.  HENT:  Positive for congestion and sore throat.   Respiratory:  Negative for cough.   Cardiovascular:  Negative for chest pain.     Observations/Objective: The patient appears to be in no acute distress  Assessment and Plan:  Problem List Items Addressed This Visit     COVID-19 - Primary   Acute  Paxlovid Rx given Take zinc 50 mg a day for 1 week, vitamin C 1000 mg daily for 1 week, vitamin D2 50,000 units weekly for 2 months (unless  taking vitamin D daily already), an antioxidant Quercetin 500 mg twice a day for 1 week (if you can get it quick enough). Take Allegra or Benadryl.  Maintain good oral hydration and take Tylenol for high fever.  Call if problems. Isolate for 5 days, then wear a mask for 5 days per CDC.       Relevant Medications   nirmatrelvir/ritonavir, renal dosing, (PAXLOVID) 10 x 150 MG & 10 x 100MG  TABS     Meds ordered this encounter  Medications   nirmatrelvir/ritonavir, renal dosing, (PAXLOVID) 10 x 150 MG & 10 x 100MG  TABS    Sig: Take 2 tablets by mouth 2 (two) times daily for 5 days. (Take nirmatrelvir 150 mg one tablet twice daily for 5 days and ritonavir 100 mg one tablet twice daily for 5 days) Patient GFR is  96    Dispense:  20 tablet    Refill:  0     Follow Up Instructions:    I discussed the assessment and treatment plan with the patient. The patient was provided an opportunity to ask questions and all were answered. The patient agreed with the plan and demonstrated an understanding of the instructions.   The patient was advised to call back or seek an in-person evaluation if the symptoms worsen or if the condition fails to improve as anticipated.  I provided face-to-face time during this encounter. We were at different locations.   Sonda Primes, MD

## 2023-07-27 NOTE — Telephone Encounter (Signed)
Patient called to cancel upcoming appointment on 07/29/2023 stating he tested positive for covid. Patient was given next available 4/3 but requested to be seen sooner.

## 2023-07-27 NOTE — Assessment & Plan Note (Signed)
Acute  Paxlovid Rx given Take zinc 50 mg a day for 1 week, vitamin C 1000 mg daily for 1 week, vitamin D2 50,000 units weekly for 2 months (unless  taking vitamin D daily already), an antioxidant Quercetin 500 mg twice a day for 1 week (if you can get it quick enough). Take Allegra or Benadryl.  Maintain good oral hydration and take Tylenol for high fever.  Call if problems. Isolate for 5 days, then wear a mask for 5 days per CDC.

## 2023-07-28 ENCOUNTER — Encounter: Payer: Self-pay | Admitting: Gastroenterology

## 2023-07-28 ENCOUNTER — Telehealth: Payer: Self-pay | Admitting: Gastroenterology

## 2023-07-28 ENCOUNTER — Ambulatory Visit: Payer: Medicare Other | Admitting: Psychology

## 2023-07-28 NOTE — Telephone Encounter (Signed)
Patient stated that he was diagnosed with covid and he was reading the counteraction with they medication.Patient also stated he is taking Paxlovid for his Covid. Patient is wanting to know if this will effect any medication that he takes for his elevated liver enzymes. Patient is requesting a call back. Please advise.

## 2023-07-28 NOTE — Telephone Encounter (Signed)
This is a duplicate message. 1/22 patient message has been sent to MD for recommendations.

## 2023-07-29 ENCOUNTER — Ambulatory Visit: Payer: Medicare Other | Admitting: Gastroenterology

## 2023-07-29 DIAGNOSIS — G4733 Obstructive sleep apnea (adult) (pediatric): Secondary | ICD-10-CM | POA: Diagnosis not present

## 2023-08-02 NOTE — Telephone Encounter (Signed)
Follow up with Dr. Adela Lank has been scheduled on 08/09/23 at 9:50 am.

## 2023-08-09 ENCOUNTER — Ambulatory Visit (INDEPENDENT_AMBULATORY_CARE_PROVIDER_SITE_OTHER): Payer: Medicare Other | Admitting: Gastroenterology

## 2023-08-09 ENCOUNTER — Encounter: Payer: Self-pay | Admitting: Gastroenterology

## 2023-08-09 VITALS — BP 122/72 | HR 68 | Ht 70.0 in | Wt 173.0 lb

## 2023-08-09 DIAGNOSIS — F1090 Alcohol use, unspecified, uncomplicated: Secondary | ICD-10-CM | POA: Diagnosis not present

## 2023-08-09 DIAGNOSIS — Z860101 Personal history of adenomatous and serrated colon polyps: Secondary | ICD-10-CM | POA: Diagnosis not present

## 2023-08-09 DIAGNOSIS — K760 Fatty (change of) liver, not elsewhere classified: Secondary | ICD-10-CM | POA: Diagnosis not present

## 2023-08-09 DIAGNOSIS — Z860100 Personal history of colon polyps, unspecified: Secondary | ICD-10-CM

## 2023-08-09 DIAGNOSIS — R7401 Elevation of levels of liver transaminase levels: Secondary | ICD-10-CM

## 2023-08-09 DIAGNOSIS — Z789 Other specified health status: Secondary | ICD-10-CM

## 2023-08-09 NOTE — Progress Notes (Signed)
HPI :  68 year old male here for follow-up visit for elevated liver enzymes, fatty liver, history of alcohol use.  Recall he has a history of CLL (followed by oncology, no treatment currently), colon polyps,  Recall that he has had some elevated liver enzymes dating back to July 2024.  Mild ALT and AST elevation into the 50s that has been persistent in recent months.  He denies any known personal history of liver disease.  No family history of liver disease.  He had an ultrasound in January 2022 of his right upper quadrant which did not show any concerning findings.  He has had testing for hepatitis B and C and negative, not immune to hep B.  He has had a prior ANA and recent past which was negative.   We previously discussed his alcohol use, and he endorsed drinking 3-4 beers per day for many years.  He states his mother was ill this past summer and he drank more than typical.  He denies any new medications over the past several months.  Since her last visit he had additional serologic workup which showed no abnormalities to account for his transaminitis.  He was not immune to hepatitis A or B and has started the series for both of those.  He also had a right upper quadrant ultrasound which showed some steatosis without cirrhosis.  Platelet count is 216.  We repeated his liver enzymes January 15, ALT 59, AST 57, alk phos and bilirubin normal.  I had asked him to reduce/abstain from alcohol.  He states he has reduced his alcohol intake from roughly 4 beers per day to roughly 2/day.  We discussed long-term plan.  Several questions about fatty liver.  Otherwise recall he previously followed with Dr. Kinnie Scales for his colonoscopy exams..  He had a colonoscopy in 2019 where he had 5 polyps removed, most adenomas.  He denies any diarrhea.  He has occasional constipation that bothers him.  No blood in his stools.  No abdominal pains routinely.  His paternal grandfather had colon cancer.  No other colon cancer in  the family.  I performed his last colonoscopy in November 2024.  He had a few polyps removed, 1 of which was large, roughly 3 cm removed in piecemeal from the hepatic flexure, consistent with sessile serrated polyp.  Tattoo placed.  I had recommended a repeat exam in 6 to 12 months.  Relevant prior workup: Cardiac cath 04/28/22: POST-OP DIAGNOSIS Correct findings on stress PET: No obstructive disease Moderate multivessel CAD, calcified: Mid LAD 60% (RFR 0.94-not significant, apical LAD RFR was still not significant-0.92.), ostial LCx 55%, mid RCA 55%. Normal LV function, normal LVEDP RECOMMENDATIONS Continue to titrate medications for cardiac risk factor reduction Consider increasing calcium channel blocker for likely microvascular disease based on stress PET results.    Colonoscopy 02/28/2018 - Dr. Kinnie Scales - "5 polyps" 3-6 mm in size, diverticulosis - adenomas and hyperplastic polyps - can't determine how much of each   Colonoscopy 12/2012 - Dr. Kinnie Scales - sigmoid 9mm polyp, hemorrhoids - adenoma     Negative hep B and hep C studies in April 2024 ANA negative 02/2022   Colonoscopy 06/01/23: - The perianal and digital rectal examinations were normal. - A diminutive polyp was found in the cecum. The polyp was flat. The polyp was removed with a cold snare. Resection and retrieval were complete. - A 25 to 30 mm polyp was found in the hepatic flexure. The polyp was flat and wrapped around the  back side of a fold. Technically this was challenging to get good positioning for polypectomy and visualize in entirety given the right colon was restricted. The polyp was removed with a piecemeal technique using a cold snare. Resection and retrieval were complete. Area across from this lesion was tattooed with an injection of Spot (carbon black). - A 3 to 4 mm polyp was found in the transverse colon. The polyp was flat. The polyp was removed with a cold snare. Resection and retrieval were complete. - Multiple  small-mouthed diverticula were found in the entire colon. - Internal hemorrhoids were found during retroflexion. - The exam was otherwise without abnormality.  FINAL DIAGNOSIS        1. Surgical [P], colon, transverse and cecum, polyp (2) :       - TUBULAR ADENOMA WITHOUT HIGH-GRADE DYSPLASIA OR MALIGNANCY       - OTHER FRAGMENT OF POLYPOID COLONIC MUCOSA WITH MILD HYPERPLASTIC CHANGES        2. Surgical [P], colon, hepatic flexure, polyp (1) :       - SESSILE SERRATED POLYP(S) WITHOUT CYTOLOGIC DYSPLASIA    Repeat exam in 6-12 months   RUQ [place] 04/28/23: IMPRESSION: 1. Increased hepatic parenchymal echogenicity suggestive of steatosis. 2. No cholelithiasis or sonographic evidence for acute cholecystitis.  Lab Results  Component Value Date   ALT 59 (H) 07/21/2023   AST 57 (H) 07/21/2023   GGT 99 (H) 02/28/2020   ALKPHOS 95 07/21/2023   BILITOT 0.5 07/21/2023        Past Medical History:  Diagnosis Date   Allergy    childhood - penicillin, 2014 - latex   Anxiety    Aortic aneurysm (HCC)    Arthritis 2014   right shoulder   CLL (chronic lymphocytic leukemia) (HCC)    Coronary artery calcification seen on CT scan    Depressed    Hx of adenomatous polyp of colon 02/2018   Dr. Lianne Moris   Hyperlipidemia    Hypertension    Sleep apnea 2022   Have stopped using CPAP     Past Surgical History:  Procedure Laterality Date   CORONARY PRESSURE/FFR STUDY N/A 04/28/2022   Procedure: INTRAVASCULAR PRESSURE WIRE/FFR STUDY;  Surgeon: [name], MD;  Location: [facility] INVASIVE CV LAB;  Service: Cardiovascular;  Laterality: N/A;   LEFT HEART CATH AND CORONARY ANGIOGRAPHY N/A 04/28/2022   Procedure: LEFT HEART CATH AND CORONARY ANGIOGRAPHY;  Surgeon: [name], MD;  Location: [facility] INVASIVE CV LAB;  Service: Cardiovascular;  Laterality: N/A;   TONSILLECTOMY  1961   TRANSURETHRAL RESECTION OF PROSTATE N/A 03/19/2023   Procedure: TRANSURETHRAL RESECTION OF THE PROSTATE  (TURP);  Surgeon: [name]., MD;  Location: [facility];  Service: Urology;  Laterality: N/A;   Family History  Problem Relation Age of Onset   Hypertension Mother    Cancer Mother    Hearing loss Mother    Heart attack Father 5   Arthritis Father    Depression Father    Heart disease Father    Depression Paternal Uncle    Depression Paternal Uncle    Ovarian cancer Maternal Grandmother    Cancer Maternal Grandmother    Hearing loss Maternal Grandfather    Heart disease Maternal Grandfather    Hypertension Maternal Grandfather    Stroke Maternal Grandfather    Colon cancer Paternal Grandfather 37   Cancer Paternal Grandfather    Esophageal cancer Neg Hx    Rectal cancer Neg Hx  Stomach cancer Neg Hx    Social History   Tobacco Use   Smoking status: Former    Current packs/day: 0.00    Average packs/day: 0.3 packs/day for 30.0 years (7.5 ttl pk-yrs)    Types: Cigarettes    Quit date: 01/04/2015    Years since quitting: 8.6    Passive exposure: Never   Smokeless tobacco: Never  Vaping Use   Vaping status: Never Used  Substance Use Topics   Alcohol use: Yes    Alcohol/week: 12.0 standard drinks of alcohol    Types: 12 Cans of beer per week    Comment: weekly   Drug use: No   Current Outpatient Medications  Medication Sig Dispense Refill   amLODipine (NORVASC) 5 MG tablet Take 1 tablet (5 mg total) by mouth daily. 90 tablet 3   aspirin EC 81 MG tablet Take 1 tablet (81 mg total) by mouth daily. Swallow whole. 90 tablet 3   b complex vitamins capsule Take 1 capsule by mouth daily.     bismuth subsalicylate (PEPTO BISMOL) 262 MG/15ML suspension Take 30 mLs by mouth every 6 (six) hours as needed for indigestion or diarrhea or loose stools.     Cholecalciferol (VITAMIN D3) 50 MCG (2000 UT) CAPS Take 1 capsule by mouth daily.     clonazePAM (KLONOPIN) 0.5 MG tablet Take 0.5 mg by mouth 4 (four) times daily as needed for anxiety.     ezetimibe (ZETIA) 10 MG tablet  Take 1 tablet (10 mg total) by mouth daily. 30 tablet 0   fluvoxaMINE (LUVOX) 100 MG tablet Take 200 mg by mouth at bedtime.     lamoTRIgine (LAMICTAL) 200 MG tablet Take 200 mg by mouth at bedtime.     multivitamin (ONE-A-DAY MEN'S) TABS tablet Take 1 tablet by mouth at bedtime.     PREVIDENT 5000 BOOSTER PLUS 1.1 % PSTE Place 1 Application onto teeth daily.     rosuvastatin (CRESTOR) 20 MG tablet Take 1 tablet (20 mg total) by mouth daily. (Patient taking differently: Take 20 mg by mouth at bedtime.) 90 tablet 3   sildenafil (VIAGRA) 50 MG tablet Take 1 tablet (50 mg total) by mouth daily as needed for erectile dysfunction (Use 1 hour before sexual activity.). 30 tablet 2   WELLBUTRIN XL 150 MG 24 hr tablet Take 150 mg by mouth every other day.     traZODone (DESYREL) 50 MG tablet Take 1 tablet (50 mg total) by mouth at bedtime. (Patient not taking: Reported on 08/09/2023) 30 tablet 5   No current facility-administered medications for this visit.   Allergies  Allergen Reactions   Levofloxacin     Avoid fluoroquinolone antibiotics due to thoracic aortic aneurysm   Penicillins Other (See Comments)    Childhood   Quinolones     Patient with ATAA (ascending thoracic aortic aneurysm)   Latex Rash     Review of Systems: All systems reviewed and negative except where noted in HPI.    Physical Exam: BP 122/72   Pulse 68   Ht 5\' 10"  (1.778 m)   Wt 173 lb (78.5 kg)   BMI 24.82 kg/m  Constitutional: Pleasant,well-developed, male in no acute distress. Neurological: Alert and oriented to person place and time. Psychiatric: Normal mood and affect. Behavior is normal.   ASSESSMENT: 68 y.o. male here for assessment of the following  1. Transaminitis   2. Fatty liver   3. Alcohol use   4. History of colon polyps  Transaminitis likely secondary to fatty liver, which I suspect is related to his alcohol use.  At baseline having roughly 4 beers per day.  We discussed alcohol use, risks  for steatosis/alcoholic liver disease.  He is drinking enough alcohol to meet that criteria.  I do not see any evidence of cirrhosis at this time and reassured him of that.  We discussed long-term risks for fibrotic change and cirrhosis however, if these changes persist.  In this light I recommend complete alcohol abstinence if he can.  We discussed this for a bit and he seems to understand and is willing to stop drinking alcohol.  His BMI is only 24, I suspect this steatosis is related to alcohol use.  That being said he may benefit from exercise.  I also counseled him on the benefits of coffee intake which can help prevent fibrotic change and he will try that, he does enjoy drinking coffee.  Moving forward he while he works on this, we will plan on repeat LFTs in July.  He should avoid any additional weight gain.  He is due for follow-up for 1 additional dose of hep A vaccine.  Otherwise, reviewed his last colonoscopy with him.  He had a advanced polyp removed in piecemeal, sessile serrated.  Recommend repeat colonoscopy 6 months from his last exam, he will try to do the exam this summer.  He will be contacted for scheduling, but I asked him to contact us when he is ready to schedule to get his preferred date and time.   PLAN: - abstain from alcohol - encouraged routine coffee intake - exercise, avoid weight gain - recall for LFTs in July - recall colonoscopy July - August time frame - follow up for second dose of hep A vaccine  Harlin Rain, MD [facility]

## 2023-08-09 NOTE — Patient Instructions (Signed)
You will be due for a recall colonoscopy in 01/2024. We will send you a reminder in the mail when it gets closer to that time.  You will  be due for LFT labs in July 2025. We will remind you when it's time to go to the lab.  You have been scheduled for your second Hep A vaccine 10/29/2023 @ 10:00am  _______________________________________________________  If your blood pressure at your visit was 140/90 or greater, please contact your primary care physician to follow up on this.  _______________________________________________________  If you are age 57 or older, your body mass index should be between 23-30. Your Body mass index is 24.82 kg/m. If this is out of the aforementioned range listed, please consider follow up with your Primary Care Provider.  If you are age 74 or younger, your body mass index should be between 19-25. Your Body mass index is 24.82 kg/m. If this is out of the aformentioned range listed, please consider follow up with your Primary Care Provider.   ________________________________________________________  The [facility] providers would like to encourage you to use [facility] to communicate with providers for non-urgent requests or questions.  Due to long hold times on the telephone, sending your provider a message by [facility] may be a faster and more efficient way to get a response.  Please allow 48 business hours for a response.  Please remember that this is for non-urgent requests.  _______________________________________________________  Thank you for entrusting me with your care and for choosing [facility], Dr. Ileene Patrick

## 2023-08-11 ENCOUNTER — Ambulatory Visit (INDEPENDENT_AMBULATORY_CARE_PROVIDER_SITE_OTHER): Payer: Medicare Other | Admitting: Psychology

## 2023-08-11 DIAGNOSIS — F4321 Adjustment disorder with depressed mood: Secondary | ICD-10-CM | POA: Diagnosis not present

## 2023-08-11 DIAGNOSIS — F410 Panic disorder [episodic paroxysmal anxiety] without agoraphobia: Secondary | ICD-10-CM

## 2023-08-11 DIAGNOSIS — F331 Major depressive disorder, recurrent, moderate: Secondary | ICD-10-CM | POA: Diagnosis not present

## 2023-08-11 NOTE — Progress Notes (Signed)
 Eugene Guzman  Name: Eugene Guzman. Date: 08/11/2023 MRN: [id number] DOB: [birth year] PCP: Wendolyn Jenkins Jansky, MD  Time spent: 60 minutes  Guardian/Payee:  N/A    Paperwork requested: No   Reason for Visit /Presenting Problem: Symptoms of Depression, Grief and Panic   Mental Status Guzman: Appearance:   Casual     Behavior:  Appropriate  Motor:  Normal  Speech/Language:   Clear and Coherent  Affect:  Appropriate  Mood:  depressed  Thought process:  normal  Thought content:    WNL  Sensory/Perceptual disturbances:    WNL  Orientation:  oriented to person, place, and situation  Attention:  Good  Concentration:  Good  Memory:  WNL  Fund of knowledge:   Good  Insight:    Good  Judgment:   Good  Impulse Control:  Good    Reported Symptoms:  sadness, powerlessness, anxiety/panic  Risk Assessment: Danger to Self:  No Self-injurious Behavior: No Danger to Others: No Duty to Warn:no Physical Aggression / Violence:No  Access to Firearms a concern: No  Gang Involvement:No  Patient / guardian was educated about steps to take if suicide or homicide risk level increases between visits: n/a While future psychiatric events cannot be accurately predicted, the patient does not currently require acute inpatient psychiatric care and does not currently meet [facility]  involuntary commitment criteria.  Substance Abuse History: Current substance abuse: No     Past Psychiatric History:   Previous psychological history is significant for depression and panic Outpatient Providers:Lisa Polis History of Psych Hospitalization: No  Psychological Testing:  N/A    Abuse History:  Victim of: No.,  N/A     Report needed: No. Victim of Neglect:No. Perpetrator of  N/A   Witness / Exposure to Domestic Violence: No   Protective Services Involvement: No  Witness to [employer] Violence:  No   Family History:  Family History  Problem Relation Age of Onset   Hypertension Mother    Cancer Mother    Hearing loss Mother    Heart attack Father 45   Arthritis Father    Depression Father    Heart disease Father    Depression Paternal Uncle    Depression Paternal Uncle    Ovarian cancer Maternal Grandmother    Cancer Maternal Grandmother    Hearing loss Maternal Grandfather    Heart disease Maternal Grandfather    Hypertension Maternal Grandfather    Stroke Maternal Grandfather    Colon cancer Paternal Grandfather 61   Cancer Paternal Grandfather    Esophageal cancer Neg Hx    Rectal cancer Neg Hx    Stomach cancer Neg Hx     Living situation: the patient lives with their spouse  Sexual Orientation: Straight  Relationship Status:  married  Name of spouse / other:unknown If a parent, number of children / ages:none  Support Systems: N/A  Surveyor, Quantity Stress:  No   Income/Employment/Disability: Neurosurgeon:  unknown  Educational History: Education: Risk Manager: unknown  Any cultural differences that may affect / interfere with treatment:  not applicable   Recreation/Hobbies: unknown  Stressors: Loss of mother    Strengths: Supportive Relationships and Able to Communicate Effectively  Barriers:  unknown   Legal History: Pending legal issue / charges: The patient has no significant history of legal issues. History of legal issue / charges:  N/A  Medical History/Surgical History: reviewed Past Medical History:  Diagnosis Date   Allergy    childhood - penicillin, 2014 - latex   Anxiety    Aortic aneurysm (HCC)    Arthritis 2014   right shoulder   CLL (chronic lymphocytic leukemia) (HCC)     Coronary artery calcification seen on CT scan    Depressed    Hx of adenomatous polyp of colon 02/2018   Dr. Milon   Hyperlipidemia    Hypertension    Sleep apnea 2022   Have stopped using CPAP    Past Surgical History:  Procedure Laterality Date   CORONARY PRESSURE/FFR STUDY N/A 04/28/2022   Procedure: INTRAVASCULAR PRESSURE WIRE/FFR STUDY;  Surgeon: Anner Alm ORN, MD;  Location: [facility] INVASIVE CV LAB;  Service: Cardiovascular;  Laterality: N/A;   LEFT HEART CATH AND CORONARY ANGIOGRAPHY N/A 04/28/2022   Procedure: LEFT HEART CATH AND CORONARY ANGIOGRAPHY;  Surgeon: Anner Alm ORN, MD;  Location: [facility] INVASIVE CV LAB;  Service: Cardiovascular;  Laterality: N/A;   TONSILLECTOMY  1961   TRANSURETHRAL RESECTION OF PROSTATE N/A 03/19/2023   Procedure: TRANSURETHRAL RESECTION OF THE PROSTATE (TURP);  Surgeon: Alvaro Ricardo KATHEE Mickey., MD;  Location: [facility];  Service: Urology;  Laterality: N/A;    Medications: Current Outpatient Medications  Medication Sig Dispense Refill   amLODipine  (NORVASC ) 5 MG tablet Take 1 tablet (5 mg total) by mouth daily. 90 tablet 3   aspirin  EC 81 MG tablet Take 1 tablet (81 mg total) by mouth daily. Swallow whole. 90 tablet 3   b complex vitamins capsule Take 1 capsule by mouth daily.     bismuth subsalicylate (PEPTO BISMOL) 262 MG/15ML suspension Take 30 mLs by mouth every 6 (six) hours as needed for indigestion or diarrhea or loose stools.     Cholecalciferol (VITAMIN D3) 50 MCG (2000 UT) CAPS Take 1 capsule by mouth daily.     clonazePAM  (KLONOPIN ) 0.5 MG tablet Take 0.5 mg by mouth 4 (four) times daily as needed for anxiety.     ezetimibe  (ZETIA ) 10 MG tablet Take 1 tablet (10 mg total) by mouth daily. 30 tablet 0   fluvoxaMINE  (LUVOX ) 100 MG tablet Take 200 mg by mouth at bedtime.     lamoTRIgine  (LAMICTAL ) 200 MG tablet Take 200 mg by mouth at bedtime.     multivitamin (ONE-A-DAY MEN'S) TABS tablet Take 1 tablet by mouth at bedtime.     PREVIDENT 5000  BOOSTER PLUS 1.1 % PSTE Place 1 Application onto teeth daily.     rosuvastatin  (CRESTOR ) 20 MG tablet Take 1 tablet (20 mg total) by mouth daily. (Patient taking differently: Take 20 mg by mouth at bedtime.) 90 tablet 3   sildenafil  (VIAGRA ) 50 MG tablet Take 1 tablet (50 mg total) by mouth daily as needed for erectile dysfunction (Use 1 hour before sexual activity.). 30  tablet 2   traZODone  (DESYREL ) 50 MG tablet Take 1 tablet (50 mg total) by mouth at bedtime. (Patient not taking: Reported on 08/09/2023) 30 tablet 5   WELLBUTRIN XL 150 MG 24 hr tablet Take 150 mg by mouth every other day.     No current facility-administered medications for this visit.    Allergies  Allergen Reactions   Levofloxacin     Avoid fluoroquinolone antibiotics due to thoracic aortic aneurysm   Penicillins Other (See Comments)    Childhood   Quinolones     Patient with ATAA (ascending thoracic aortic aneurysm)   Latex Rash    Diagnoses:  Major Depression, Panic, Grief  Plan of Care: Outpatient Psychotherapy and medication management  Initial Note: He states he has taken psychotropic medication for years and sees Olam Holly at [facility]. States [place] and feels he has suffered depression most of his life. He only took vacation once a year and would get really depressed when it was over. First significant depression was at 33 when he had a break-up. Had more minor depressive episodes earlier in life. Change has always been difficult for him. Also has had severe panic attacks. First one he remembers was first day of his fifth year of college. He was distressed that this was the end of his college career. He recalls having a panic when on business trip to [place] in a [employer]. He had to come home. 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 He was offered early retirement and had a panic when he realized that he just quit a job of 30 years and his life  was changing. When he has panic, he doesn't have a fear of death, rather it is a fear of something he can't control. His anxiety was less prevalent in his life than the depression.  He says my major problem in life is depression and panic. His mother passed and he is struggling with her loss. She died 2 weeks ago at 9 and was a vibrant person for 92 years. In 1 year, she had rapid dementia. He says, she was his best friend. He has a small farm down the road from where his mother lived. He says I don't handle death well. His reason to seek therapy was before mother was deteriorating. He had tried therapy in the past and it was not helpful. Also, he has had medical challenges himself, but says it does not cause him distress. He has been on a number of different anti-depressants over the years.  He contacted me for his depression, ut says the grief is now most prevalent.  He has 1 sister in [place]. She is 3 years older and they have a good relationship, but she has kids and grandchildren and they keep her busy. He married at 59 and wife was 3. His wife will occasionally tell him she blames him for not having kids. He told her when they married that he was too old to have kids. He says it was his inability to handle change. This is his only marriage for both he and his wife. Wife is from [place]  and has 3 sisters and one brother. None of her family in this area. He left [employer] at 52 (too early). 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. Wife retired in 2013.  Will complete intake at next visit. Patient states he wants to  continue treatment and prefers face to face when possible.   Goals/Tx. Plan: Patient states that he is seeking symptom relief from life long depressive episodes. First, however, he needs support and strategies to manage grief over the recent (2 weeks) loss of his mother. Will engage in grief counseling to reduce current despair. In addition, treatment will address  his anxiety that contributes to episodic panic attacks. This will involve relaxation techniques and other behavioral strategies  to facilitate greater control over his anxiety. Patient agrees to plan with a goal date of 12-25.   Patient was seen face to face in Provider's office.  Session note: Patient states he is not sleeping well. He says that he is emotionally struggling to let go of the house. His nephew made an offer on the house (as we talked about at last session), but they rejected it. A second offer was made and they have to decide by Feb. 15th. The stress of the deal bothers him during the day, but at night is haunted by his mother's death and losing the house (and associated memories). He doesn't think he should be having such a hard time at his age. He says that he was so close with parents that he has a hard time separating emotionally. He thinks his lack of religious faith makes it more difficult for him. Realizes that the house is his attempt to hold on to the family and his past. Feels he never really left home.  We talked about how to let go of the physical representations of his childhood and life. He says the fact that we never had kids makes letting go more difficult.  He says his drinking is somewhat better, but I have not reached my goals. He says he has been taking Clonazepam  most says to help him sleep. We talked about how to make a decision that will allow him to move forward.             CONI ALM KERNS, PhD 9:40a-10:30a 50 minutes

## 2023-08-18 ENCOUNTER — Emergency Department: Payer: Medicare Other

## 2023-08-18 ENCOUNTER — Emergency Department
Admission: EM | Admit: 2023-08-18 | Discharge: 2023-08-18 | Disposition: A | Discharge status: Home / Self Care | Payer: Medicare Other | Attending: Student in an Organized Health Care Education/Training Program | Admitting: Student in an Organized Health Care Education/Training Program

## 2023-08-18 ENCOUNTER — Other Ambulatory Visit: Payer: Self-pay

## 2023-08-18 DIAGNOSIS — N281 Cyst of kidney, acquired: Secondary | ICD-10-CM | POA: Diagnosis not present

## 2023-08-18 DIAGNOSIS — W01198A Fall on same level from slipping, tripping and stumbling with subsequent striking against other object, initial encounter: Secondary | ICD-10-CM | POA: Insufficient documentation

## 2023-08-18 DIAGNOSIS — K429 Umbilical hernia without obstruction or gangrene: Secondary | ICD-10-CM | POA: Diagnosis not present

## 2023-08-18 DIAGNOSIS — R519 Headache, unspecified: Secondary | ICD-10-CM | POA: Diagnosis not present

## 2023-08-18 DIAGNOSIS — R55 Syncope and collapse: Secondary | ICD-10-CM | POA: Diagnosis not present

## 2023-08-18 DIAGNOSIS — I6782 Cerebral ischemia: Secondary | ICD-10-CM | POA: Diagnosis not present

## 2023-08-18 DIAGNOSIS — R140 Abdominal distension (gaseous): Secondary | ICD-10-CM | POA: Insufficient documentation

## 2023-08-18 DIAGNOSIS — R110 Nausea: Secondary | ICD-10-CM | POA: Insufficient documentation

## 2023-08-18 DIAGNOSIS — K5730 Diverticulosis of large intestine without perforation or abscess without bleeding: Secondary | ICD-10-CM | POA: Diagnosis not present

## 2023-08-18 DIAGNOSIS — D72829 Elevated white blood cell count, unspecified: Secondary | ICD-10-CM | POA: Diagnosis not present

## 2023-08-18 DIAGNOSIS — R42 Dizziness and giddiness: Secondary | ICD-10-CM | POA: Diagnosis not present

## 2023-08-18 DIAGNOSIS — M4802 Spinal stenosis, cervical region: Secondary | ICD-10-CM | POA: Diagnosis not present

## 2023-08-18 DIAGNOSIS — M4803 Spinal stenosis, cervicothoracic region: Secondary | ICD-10-CM | POA: Diagnosis not present

## 2023-08-18 LAB — CBC WITH DIFFERENTIAL/PLATELET
Abs Immature Granulocytes: 0.04 10*3/uL (ref 0.00–0.07)
Basophils Absolute: 0.1 10*3/uL (ref 0.0–0.1)
Basophils Relative: 0 %
Eosinophils Absolute: 0.1 10*3/uL (ref 0.0–0.5)
Eosinophils Relative: 1 %
HCT: 44.8 % (ref 39.0–52.0)
Hemoglobin: 15.3 g/dL (ref 13.0–17.0)
Immature Granulocytes: 0 %
Lymphocytes Relative: 61 %
Lymphs Abs: 8.3 10*3/uL — ABNORMAL HIGH (ref 0.7–4.0)
MCH: 31.1 pg (ref 26.0–34.0)
MCHC: 34.2 g/dL (ref 30.0–36.0)
MCV: 91.1 fL (ref 80.0–100.0)
Monocytes Absolute: 0.7 10*3/uL (ref 0.1–1.0)
Monocytes Relative: 5 %
Neutro Abs: 4.5 10*3/uL (ref 1.7–7.7)
Neutrophils Relative %: 33 %
Platelets: 198 10*3/uL (ref 150–400)
RBC: 4.92 MIL/uL (ref 4.22–5.81)
RDW: 13.2 % (ref 11.5–15.5)
Smear Review: NORMAL
WBC: 13.8 10*3/uL — ABNORMAL HIGH (ref 4.0–10.5)
nRBC: 0 % (ref 0.0–0.2)

## 2023-08-18 LAB — LIPASE, BLOOD: Lipase: 42 U/L (ref 11–51)

## 2023-08-18 LAB — TROPONIN I (HIGH SENSITIVITY)
Troponin I (High Sensitivity): 11 ng/L (ref <18)
Troponin I (High Sensitivity): 11 ng/L

## 2023-08-18 LAB — COMPREHENSIVE METABOLIC PANEL
ALT: 42 U/L (ref 0–44)
AST: 42 U/L — ABNORMAL HIGH (ref 15–41)
Albumin: 4.6 g/dL (ref 3.5–5.0)
Alkaline Phosphatase: 87 U/L (ref 38–126)
Anion gap: 17 — ABNORMAL HIGH (ref 5–15)
BUN: 10 mg/dL (ref 8–23)
CO2: 18 mmol/L — ABNORMAL LOW (ref 22–32)
Calcium: 9.5 mg/dL (ref 8.9–10.3)
Chloride: 100 mmol/L (ref 98–111)
Creatinine, Ser: 0.81 mg/dL (ref 0.61–1.24)
GFR, Estimated: >60 mL/min (ref >60)
Glucose, Bld: 98 mg/dL (ref 70–99)
Potassium: 4.2 mmol/L (ref 3.5–5.1)
Sodium: 135 mmol/L (ref 135–145)
Total Bilirubin: 0.9 mg/dL (ref 0.0–1.2)
Total Protein: 7.7 g/dL (ref 6.5–8.1)

## 2023-08-18 LAB — RESP PANEL BY RT-PCR (RSV, FLU A&B, COVID)  RVPGX2
Influenza A by PCR: NEGATIVE
Influenza B by PCR: NEGATIVE
Resp Syncytial Virus by PCR: NEGATIVE
SARS Coronavirus 2 by RT PCR: NEGATIVE

## 2023-08-18 MED ORDER — SODIUM CHLORIDE 0.9 % IV BOLUS
1000.0000 mL | Freq: Once | INTRAVENOUS | Status: AC
  Administered 2023-08-18: 1000 mL via INTRAVENOUS

## 2023-08-18 MED ORDER — IOHEXOL 300 MG/ML  SOLN
100.0000 mL | Freq: Once | INTRAMUSCULAR | Status: AC | PRN
  Administered 2023-08-18: 100 mL via INTRAVENOUS

## 2023-08-18 MED ORDER — ALUM & MAG HYDROXIDE-SIMETH 200-200-20 MG/5ML PO SUSP
30.0000 mL | Freq: Once | ORAL | Status: AC
  Administered 2023-08-18: 30 mL via ORAL
  Filled 2023-08-18: qty 30

## 2023-08-18 MED ORDER — MORPHINE SULFATE (PF) 4 MG/ML IV SOLN
4.0000 mg | INTRAVENOUS | Status: DC | PRN
  Administered 2023-08-18: 4 mg via INTRAVENOUS
  Filled 2023-08-18: qty 1

## 2023-08-18 MED ORDER — LORAZEPAM 1 MG PO TABS
1.0000 mg | ORAL_TABLET | Freq: Once | ORAL | Status: AC
  Administered 2023-08-18: 1 mg via ORAL
  Filled 2023-08-18: qty 1

## 2023-08-18 MED ORDER — ONDANSETRON HCL 4 MG/2ML IJ SOLN
4.0000 mg | Freq: Once | INTRAMUSCULAR | Status: AC
  Administered 2023-08-18: 4 mg via INTRAVENOUS
  Filled 2023-08-18: qty 2

## 2023-08-18 NOTE — ED Provider Notes (Signed)
[facility] Provider Note    Event Date/Time   First MD Initiated Contact with Patient 08/18/23 [phone]     (approximate)   History   Fall   HPI  Eugene Guzman. is a 68 y.o. male presents to the ER for evaluation of headache that occurred after the patient had near syncopal episode falling striking the back of his head this morning.  States that for the past 24 hours he has been feeling some abdominal bloating nausea no vomiting.  Feels like he is full.  States he did not get much sleep last night went up to pantry to grab some simethicone on top shelf.  When he reached up passed out falling backwards and hitting his head.  States he never fully lost consciousness just that things went dark for a moment.  Denies any numbness or tingling.  He is not on any blood thinners.  Is never had symptoms like this before.     Physical Exam   Triage Vital Signs: ED Triage Vitals [08/18/23 0828]  Encounter Vitals Group     BP (!) 140/77     Systolic BP Percentile      Diastolic BP Percentile      Pulse Rate 70     Resp 18     Temp 98 F (36.7 C)     Temp src      SpO2 98 %     Weight 170 lb (77.1 kg)     Height 5\' 10"  (1.778 m)     Head Circumference      Peak Flow      Pain Score 7     Pain Loc      Pain Education      Exclude from Growth Chart     Most recent vital signs: Vitals:   08/18/23 0828 08/18/23 1200  BP: (!) 140/77 (!) 142/77  Pulse: 70 84  Resp: 18 (!) 21  Temp: 98 F (36.7 C)   SpO2: 98% 100%     Constitutional: Alert  Eyes: Conjunctivae are normal.  Head: Atraumatic. Nose: No congestion/rhinnorhea. Mouth/Throat: Mucous membranes are moist.   Neck: Painless ROM.  Cardiovascular:   Good peripheral circulation. Respiratory: Normal respiratory effort.  No retractions.  Gastrointestinal: Soft and nontender.  Musculoskeletal:  no deformity Neurologic:  MAE spontaneously. No gross focal neurologic deficits are appreciated.   Skin:  Skin is warm, dry and intact. No rash noted. Psychiatric: Mood and affect are normal. Speech and behavior are normal.    ED Results / Procedures / Treatments   Labs (all labs ordered are listed, but only abnormal results are displayed) Labs Reviewed  CBC WITH DIFFERENTIAL/PLATELET - Abnormal; Notable for the following components:      Result Value   WBC 13.8 (*)    Lymphs Abs 8.3 (*)    All other components within normal limits  COMPREHENSIVE METABOLIC PANEL - Abnormal; Notable for the following components:   CO2 18 (*)    AST 42 (*)    Anion gap 17 (*)    All other components within normal limits  RESP PANEL BY RT-PCR (RSV, FLU A&B, COVID)  RVPGX2  LIPASE, BLOOD  PATHOLOGIST SMEAR REVIEW  TROPONIN I (HIGH SENSITIVITY)  TROPONIN I (HIGH SENSITIVITY)     EKG  ED ECG REPORT I, Willy Eddy, the attending physician, personally viewed and interpreted this ECG.   Date: 08/18/2023  EKG Time: 14:34  Rate: 70  Rhythm: sinus  Axis: normal  Intervals: iRBBB  ST&T Change:  no stemi, no depressions     RADIOLOGY Please see ED Course for my review and interpretation.  I personally reviewed all radiographic images ordered to evaluate for the above acute complaints and reviewed radiology reports and findings.  These findings were personally discussed with the patient.  Please see medical record for radiology report.    PROCEDURES:  Critical Care performed: No  Procedures   MEDICATIONS ORDERED IN ED: Medications  morphine (PF) 4 MG/ML injection 4 mg (4 mg Intravenous Given 08/18/23 1155)  sodium chloride 0.9 % bolus 1,000 mL (0 mLs Intravenous Stopped 08/18/23 1153)  iohexol (OMNIPAQUE) 300 MG/ML solution 100 mL (100 mLs Intravenous Contrast Given 08/18/23 1011)  ondansetron (ZOFRAN) injection 4 mg (4 mg Intravenous Given 08/18/23 1155)  LORazepam (ATIVAN) tablet 1 mg (1 mg Oral Given 08/18/23 1258)  alum & mag hydroxide-simeth (MAALOX/MYLANTA) 200-200-20  MG/5ML suspension 30 mL (30 mLs Oral Given 08/18/23 1258)     IMPRESSION / MDM / ASSESSMENT AND PLAN / ED COURSE  I reviewed the triage vital signs and the nursing notes.                              Differential diagnosis includes, but is not limited to, dehydration, electrolyte abnormality, stress, dysrhythmia, SDH, IPH, fracture, contusion, obstruction, diverticulitis  Patient presenting to the ER for evaluation of symptoms as described above.  Based on symptoms, risk factors and considered above differential, this presenting complaint could reflect a potentially life-threatening illness therefore the patient will be placed on continuous pulse oximetry and telemetry for monitoring.  Laboratory evaluation will be sent to evaluate for the above complaints.  Patient's presentation with broad differential.  Will order blood work as well as CT imaging to evaluate for the but differential will give some IV fluids as I do suspect a large component of dehydration.  Will order serial enzymes.    Clinical Course as of 08/18/23 1440  Wed Aug 18, 2023  0903 CT head on my review and interpretation without evidence of SDH or IPH. [PR]  0942 Mild leukocytosis. [PR]  1025 CT cervical spine finding of prevertebral edema.  Given concern for ligamentous injury will order MRI per radiology recommendation. [PR]  1119 Mri without evidence of acute findings.  Edema felt to be 2/2 degenerative changes.  [PR]  1251 Discussed findings of MRI and CT with patient.  No acute findings identified thus far.  Patient does admit to feeling quite anxious and nervous.  He is describing some burning discomfort in his stomach.  No acute intra-abdominal processes identified will give GI cocktail as well as anxiolytic.  Will repeat troponin. [PR]  1420 Patient reassessed.  Appears significantly improved after some Ativan.  Given his reassuring workup I do believe he is stable and appropriate for outpatient follow-up. [PR]     Clinical Course User Index [PR] Willy Eddy, MD     FINAL CLINICAL IMPRESSION(S) / ED DIAGNOSES   Final diagnoses:  Near syncope     Rx / DC Orders   ED Discharge Orders     None        Note:  This document was prepared using Dragon voice recognition software and may include unintentional dictation errors.    Willy Eddy, MD 08/18/23 [phone]

## 2023-08-18 NOTE — ED Triage Notes (Signed)
Pt comes with c/o fall this morning. Pt states he just fell backwards and wasn't able to catch himself. Pt states headache since falling and some dizziness. Pt states no thinners.   Pt does have noticeable tremors but that is not knew.

## 2023-08-19 LAB — PATHOLOGIST SMEAR REVIEW

## 2023-08-25 ENCOUNTER — Ambulatory Visit: Payer: Medicare HMO | Admitting: Psychology

## 2023-09-08 ENCOUNTER — Ambulatory Visit: Payer: Medicare Other | Admitting: Family Medicine

## 2023-09-09 ENCOUNTER — Ambulatory Visit (INDEPENDENT_AMBULATORY_CARE_PROVIDER_SITE_OTHER): Payer: Medicare HMO | Admitting: Psychology

## 2023-09-09 DIAGNOSIS — F331 Major depressive disorder, recurrent, moderate: Secondary | ICD-10-CM | POA: Diagnosis not present

## 2023-09-09 DIAGNOSIS — F4321 Adjustment disorder with depressed mood: Secondary | ICD-10-CM

## 2023-09-09 DIAGNOSIS — F410 Panic disorder [episodic paroxysmal anxiety] without agoraphobia: Secondary | ICD-10-CM

## 2023-09-09 NOTE — Progress Notes (Signed)
 [facility] Counselor Initial Adult Exam  Name: Eugene Guzman. Date: 09/09/2023 MRN: [id number] DOB: 04/21/1956 PCP: [name], MD  Time spent: 60 minutes  Guardian/Payee:  N/A    Paperwork requested: No   Reason for Visit /Presenting Problem: Symptoms of Depression, Grief and Panic   Mental Status Exam: Appearance:   Casual     Behavior:  Appropriate  Motor:  Normal  Speech/Language:   Clear and Coherent  Affect:  Appropriate  Mood:  depressed  Thought process:  normal  Thought content:    WNL  Sensory/Perceptual disturbances:    WNL  Orientation:  oriented to person, place, and situation  Attention:  Good  Concentration:  Good  Memory:  WNL  Fund of knowledge:   Good  Insight:    Good  Judgment:   Good  Impulse Control:  Good    Reported Symptoms:  sadness, powerlessness, anxiety/panic  Risk Assessment: Danger to Self:  No Self-injurious Behavior: No Danger to Others: No Duty to Warn:no Physical Aggression / Violence:No  Access to Firearms a concern: No  Gang Involvement:No  Patient / guardian was educated about steps to take if suicide or homicide risk level increases between visits: n/a While future psychiatric events cannot be accurately predicted, the patient does not currently require acute inpatient psychiatric care and does not currently meet [facility] involuntary commitment criteria.  Substance Abuse History: Current substance abuse: No     Past Psychiatric History:   Previous psychological history is significant for depression and panic Outpatient Providers:Lisa Polis History of Psych Hospitalization: No  Psychological Testing:  N/A    Abuse History:   Victim of: No.,  N/A    Report needed: No. Victim of Neglect:No. Perpetrator of  N/A   Witness / Exposure to Domestic Violence: No   Protective Services Involvement: No  Witness to [employer] Violence:  No   Family History:  Family History  Problem Relation Age of Onset   Hypertension Mother    Cancer Mother    Hearing loss Mother    Heart attack Father 91   Arthritis Father    Depression Father    Heart disease Father    Depression Paternal Uncle    Depression Paternal Uncle    Ovarian cancer Maternal Grandmother    Cancer Maternal Grandmother    Hearing loss Maternal Grandfather    Heart disease Maternal Grandfather    Hypertension Maternal Grandfather    Stroke Maternal Grandfather    Colon cancer Paternal Grandfather 32   Cancer Paternal Grandfather    Esophageal cancer Neg Hx    Rectal cancer Neg Hx    Stomach cancer Neg Hx  Living situation: the patient lives with their spouse  Sexual Orientation: Straight  Relationship Status: married  Name of spouse / other:unknown If a parent, number of children / ages:none  Support Systems: N/A  Financial Stress:  No   Income/Employment/Disability: Neurosurgeon:  unknown  Educational History: Education: Risk manager: unknown  Any cultural differences that may affect / interfere with treatment:  not applicable   Recreation/Hobbies: unknown  Stressors: Loss of mother    Strengths: Supportive Relationships and Able to Communicate Effectively  Barriers:  unknown   Legal History: Pending legal issue / charges: The patient has no significant history of legal issues. 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 Lianne Moris   Hyperlipidemia    Hypertension    Sleep apnea 2022   Have stopped using CPAP    Past Surgical History:  Procedure Laterality Date   CORONARY PRESSURE/FFR STUDY N/A 04/28/2022   Procedure: INTRAVASCULAR PRESSURE WIRE/FFR STUDY;  Surgeon: [name], MD;  Location: [facility] INVASIVE CV LAB;  Service: Cardiovascular;  Laterality: N/A;   LEFT HEART CATH AND CORONARY ANGIOGRAPHY N/A 04/28/2022   Procedure: LEFT HEART CATH AND CORONARY ANGIOGRAPHY;  Surgeon: [name], MD;  Location: [facility] INVASIVE CV LAB;  Service: Cardiovascular;  Laterality: N/A;   TONSILLECTOMY  1961   TRANSURETHRAL RESECTION OF PROSTATE N/A 03/19/2023   Procedure: TRANSURETHRAL RESECTION OF THE PROSTATE (TURP);  Surgeon: [name]., MD;  Location: [facility] ORS;  Service: Urology;  Laterality: N/A;    Medications: Current Outpatient Medications  Medication Sig Dispense Refill   amLODipine (NORVASC) 5 MG tablet Take 1 tablet (5 mg total) by mouth daily. 90 tablet 3   aspirin EC 81 MG tablet Take 1 tablet (81 mg total) by mouth daily. Swallow whole. 90 tablet 3   b complex vitamins capsule Take 1 capsule by mouth daily.     bismuth subsalicylate (PEPTO BISMOL) 262 MG/15ML suspension Take 30 mLs by mouth every 6 (six) hours as needed for indigestion or diarrhea or loose stools.     Cholecalciferol (VITAMIN D3) 50 MCG (2000 UT) CAPS Take 1 capsule by mouth daily.     clonazePAM (KLONOPIN) 0.5 MG tablet Take 0.5 mg by mouth 4 (four) times daily as needed for anxiety.     ezetimibe (ZETIA) 10 MG tablet Take 1 tablet (10 mg total) by mouth daily. 30 tablet 0   fluvoxaMINE (LUVOX) 100 MG tablet Take 200 mg by mouth at bedtime.     lamoTRIgine (LAMICTAL) 200 MG tablet Take 200 mg by mouth at bedtime.     multivitamin (ONE-A-DAY MEN'S) TABS tablet Take 1 tablet by mouth at bedtime.      PREVIDENT 5000 BOOSTER PLUS 1.1 % PSTE Place 1 Application onto teeth daily.     rosuvastatin (CRESTOR) 20 MG tablet Take 1 tablet (20 mg total) by mouth daily. (Patient taking differently: Take 20 mg by mouth at bedtime.) 90 tablet 3   sildenafil (VIAGRA) 50 MG tablet Take 1 tablet (50 mg total)  by mouth daily as needed for erectile dysfunction (Use 1 hour before sexual activity.). 30 tablet 2   traZODone (DESYREL) 50 MG tablet Take 1 tablet (50 mg total) by mouth at bedtime. (Patient not taking: Reported on 08/09/2023) 30 tablet 5   WELLBUTRIN XL 150 MG 24 hr tablet Take 150 mg by mouth every other day.     No current facility-administered medications for this visit.    Allergies  Allergen Reactions   Levofloxacin     Avoid fluoroquinolone antibiotics due to thoracic aortic aneurysm   Penicillins Other (See Comments)    Childhood   Quinolones     Patient with ATAA (ascending thoracic aortic aneurysm)   Latex Rash    Diagnoses:  Major Depression, Panic, Grief  Plan of Care: Outpatient Psychotherapy and medication management  Initial Note: He states he has taken psychotropic medication for years and sees Clayton Lefort at [facility]. States [place] and feels he has suffered depression most of his life. He only took vacation once a year and would get really depressed when it was over. First significant depression was at 33 when he had a break-up. Had more minor depressive episodes earlier in life. Change has always been difficult for him. Also has had severe panic attacks. First one he remembers was first day of his fifth year of college. He was distressed that "this was the end of his college career". He recalls having a panic when on business trip to [place] in a [employer]. He had to come home. 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 He was offered early retirement and had a panic when he realized that he just quit a job of 30  years and his life was changing. When he has panic, he doesn't have a fear of death, rather it is a fear of something he can't control. His anxiety was less prevalent in his life than the depression.  He says "my major problem in life is depression and panic". His mother passed and he is struggling with her loss. She died 2 weeks ago at 39 and was a vibrant person for 92 years. In 1 year, she had rapid dementia. He says, "she was his best friend". He has a small farm down the road from where his mother lived. He says "I don't handle death well". His reason to seek therapy was before mother was deteriorating. He had tried therapy in the past and it was not helpful. Also, he has had medical challenges himself, but says it does not cause him distress. He has been on a number of different anti-depressants over the years.  He contacted me for his depression, ut says the grief is now most prevalent.  He has 1 sister in [place]. She is 3 years older and they have a good relationship, but she has kids and grandchildren and they keep her busy. He married at 30 and wife was 40. His wife will occasionally tell him she blames him for not having kids. He told her when they married that he was too old to have kids. He says it was his "inability to handle change". This is his only marriage for both he and his wife. Wife is from [place] and has 3 sisters and one brother. None of her family in this area. He left [employer] at 41 ("too early"). 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. Wife  retired in 2013.  Will complete intake at next visit. Patient states he wants to continue treatment and prefers face to face when possible.   Goals/Tx. Plan: Patient states that he is seeking symptom relief from life long depressive episodes. First, however, he needs support and strategies to manage grief over the recent (2 weeks) loss of his mother. Will engage in grief counseling to reduce current despair. In addition,  treatment will address his anxiety that contributes to episodic panic attacks. This will involve relaxation techniques and other behavioral strategies  to facilitate greater control over his anxiety. Patient agrees to plan with a goal date of 12-25.   Patient was seen face to face in Provider's office.  Session note: Patient states his wife wants him to get the name of an alcohol rehab facility "as a back-up". I provided [facility] as an option. He claims he is doing better with his alcohol consumption, but it still remains high.  He is still struggling with coming to terms with the fact that his mother's house will soon be empty. He is thinking that they will start to wrap up things by June. He feels his alcohol consumption will be much less once he is done with all of the estate issues. Has had a number of physical, medical issues in addition to still feeling depression.               Garrel Ridgel, PhD 10:40a-11:30a 50 minutes

## 2023-09-14 DIAGNOSIS — H2513 Age-related nuclear cataract, bilateral: Secondary | ICD-10-CM | POA: Diagnosis not present

## 2023-09-14 DIAGNOSIS — H5203 Hypermetropia, bilateral: Secondary | ICD-10-CM | POA: Diagnosis not present

## 2023-09-14 DIAGNOSIS — H52223 Regular astigmatism, bilateral: Secondary | ICD-10-CM | POA: Diagnosis not present

## 2023-09-14 DIAGNOSIS — H3561 Retinal hemorrhage, right eye: Secondary | ICD-10-CM | POA: Diagnosis not present

## 2023-09-14 DIAGNOSIS — H524 Presbyopia: Secondary | ICD-10-CM | POA: Diagnosis not present

## 2023-09-21 ENCOUNTER — Ambulatory Visit (INDEPENDENT_AMBULATORY_CARE_PROVIDER_SITE_OTHER): Admitting: Family Medicine

## 2023-09-21 ENCOUNTER — Encounter: Payer: Self-pay | Admitting: Family Medicine

## 2023-09-21 VITALS — BP 128/60 | HR 67 | Temp 97.9°F | Resp 18 | Ht 70.0 in | Wt 173.1 lb

## 2023-09-21 DIAGNOSIS — R55 Syncope and collapse: Secondary | ICD-10-CM

## 2023-09-21 DIAGNOSIS — E7800 Pure hypercholesterolemia, unspecified: Secondary | ICD-10-CM

## 2023-09-21 DIAGNOSIS — F339 Major depressive disorder, recurrent, unspecified: Secondary | ICD-10-CM

## 2023-09-21 DIAGNOSIS — I10 Essential (primary) hypertension: Secondary | ICD-10-CM | POA: Diagnosis not present

## 2023-09-21 DIAGNOSIS — G4733 Obstructive sleep apnea (adult) (pediatric): Secondary | ICD-10-CM

## 2023-09-21 DIAGNOSIS — C9110 Chronic lymphocytic leukemia of B-cell type not having achieved remission: Secondary | ICD-10-CM

## 2023-09-21 DIAGNOSIS — R351 Nocturia: Secondary | ICD-10-CM

## 2023-09-21 DIAGNOSIS — I7121 Aneurysm of the ascending aorta, without rupture: Secondary | ICD-10-CM

## 2023-09-21 DIAGNOSIS — R251 Tremor, unspecified: Secondary | ICD-10-CM

## 2023-09-21 DIAGNOSIS — N401 Enlarged prostate with lower urinary tract symptoms: Secondary | ICD-10-CM | POA: Diagnosis not present

## 2023-09-21 NOTE — Progress Notes (Signed)
 Subjective:     Patient ID: Eugene Guzman., male    DOB: 1955-10-18, 68 y.o.   MRN: [id number]  Chief Complaint  Patient presents with   Medical Management of Chronic Issues    6 month follow-up Fasting     HPI  Anxiety/Depression - Still struggling with grief since his mom passed on 2024/01/30. Is harder on holidays, plans to maybe go out of town on upcoming holidays. Is taking Clonazepam 0.5 mg as needed for anxiety, has started taking at nighttime to help him sleep. No SI.    2.  Dizziness/Fall - He continues to experience intermittent dizziness. He reports he recently fell/bumped into a table when going to use the bathroom in the middle of the night. He states the hallway was dark, making it harder.     3.  Nocturia - Has to wake up multiple times throughout the night to use the bathroom.  Is on tamsulosin 0.4 mg daily and proscar 5 mg. Follows up with urology had TURP on 9/13.   Some better but still getting up a lot   4.  Fatigue - He complains of fatigue. He believes the fatigue may be worsened by his depression/anxiety.    5.  Hand tremors - He complains of intermittent bilateral hand tremors. Has to only fill glasses half full, if he fills the glass of water up he will spill most of it due to the tremor. States his hands tremors even at rest. Reports his grandfather had a hx of hand tremors.    6.  CLL-He has followed up with oncology  7  Sleep apnea - Has not been using his CPAP d/t claustrophobia  Htn/hld/asc thoracic aneury-managed by card  Discussed the use of AI scribe software for clinical note transcription with the patient, who gave verbal consent to proceed.  History of Present Illness   Eugene Guzman. "[name]" is a 68 year old male with chronic lymphocytic leukemia who presents for follow-up  He experienced a syncopal episode on February 12th after not sleeping the previous night due to discomfort from constipation following a large meal. He felt  'stuffed all day' and fainted for about three seconds after standing up quickly, hitting his head on the floor. A comprehensive workup in the ER, including blood work, two CT scans, and an MRI, showed no acute findings.  He has chronic lymphocytic leukemia (CLL) and experiences night sweats, described as feeling a drop of sweat when lying on his side, though not soaking the bed. He follows up every six months at [facility] for CLL monitoring, with recent lab results showing some improvement.  He experiences tremors, which have not improved despite stopping and then restarting Wellbutrin. There is a family history of tremors, as his maternal grandfather also had them. He has not seen a neurologist specifically for the tremors but has had multiple scans that ruled out a brain tumor.  He has a history of sleep apnea and has been using a wedge to help alleviate symptoms. He has reduced his alcohol intake, which he attributes to stress and emotional difficulties following his mother's passing. He is working with a therapist to address these issues.  he can overuse alcohol when upset/mad.  plans to reduce use and possibly going to AA  He has a history of prostate surgery and notes that he is currently getting up to urinate about five times a night, which is an improvement from before the surgery, but improved after  the surgery, but getting worse again. He is unsure if he is currently taking any medication for his prostate as it is not listed on his medication list.  He reports a persistent dry, non-productive cough in the mornings, which he suspects may be related to reflux. nothing throughout day  He mentions a recent fall that resulted in swelling and redness in his left knee, which has been evaluated by [facility] and is attributed to soft tissue injury without significant pain. told no blood clot either.  he does have slight swelling and the color of the L lower leg is a little darker than the R.        Health Maintenance Due  Topic Date Due   Medicare Annual Wellness (AWV)  09/28/2023    Past Medical History:  Diagnosis Date   Allergy    childhood - penicillin, 2014 - latex   Anxiety    Aortic aneurysm (HCC)    Arthritis 2014   right shoulder   CLL (chronic lymphocytic leukemia) (HCC)    Coronary artery calcification seen on CT scan    Depressed    Hx of adenomatous polyp of colon 02/2018   Dr. Lianne Moris   Hyperlipidemia    Hypertension    Sleep apnea 2022   Have stopped using CPAP    Past Surgical History:  Procedure Laterality Date   CORONARY PRESSURE/FFR STUDY N/A 04/28/2022   Procedure: INTRAVASCULAR PRESSURE WIRE/FFR STUDY;  Surgeon: [name], MD;  Location: [facility] INVASIVE CV LAB;  Service: Cardiovascular;  Laterality: N/A;   LEFT HEART CATH AND CORONARY ANGIOGRAPHY N/A 04/28/2022   Procedure: LEFT HEART CATH AND CORONARY ANGIOGRAPHY;  Surgeon: [name], MD;  Location: [facility] INVASIVE CV LAB;  Service: Cardiovascular;  Laterality: N/A;   TONSILLECTOMY  1961   TRANSURETHRAL RESECTION OF PROSTATE N/A 03/19/2023   Procedure: TRANSURETHRAL RESECTION OF THE PROSTATE (TURP);  Surgeon: [name]., MD;  Location: [facility] ORS;  Service: Urology;  Laterality: N/A;     Current Outpatient Medications:    amLODipine (NORVASC) 5 MG tablet, Take 1 tablet (5 mg total) by mouth daily., Disp: 90 tablet, Rfl: 3   aspirin EC 81 MG tablet, Take 1 tablet (81 mg total) by mouth daily. Swallow whole., Disp: 90 tablet, Rfl: 3   B COMPLEX VITAMINS PO, Take 1 capsule by mouth daily. 100 mg daily, Disp: , Rfl:    bismuth subsalicylate (PEPTO BISMOL) 262 MG/15ML suspension, Take 30 mLs by mouth every 6 (six) hours as needed for indigestion or diarrhea or loose stools., Disp: , Rfl:    Cholecalciferol (VITAMIN D3) 50 MCG (2000 UT) CAPS, Take 1 capsule by mouth daily., Disp: , Rfl:    clonazePAM (KLONOPIN) 0.5 MG tablet, Take 0.5 mg by mouth 4 (four) times daily as needed for  anxiety., Disp: , Rfl:    ezetimibe (ZETIA) 10 MG tablet, Take 1 tablet (10 mg total) by mouth daily., Disp: 30 tablet, Rfl: 0   fluvoxaMINE (LUVOX) 100 MG tablet, Take 200 mg by mouth at bedtime., Disp: , Rfl:    lamoTRIgine (LAMICTAL) 200 MG tablet, Take 200 mg by mouth at bedtime., Disp: , Rfl:    multivitamin (ONE-A-DAY MEN'S) TABS tablet, Take 1 tablet by mouth at bedtime., Disp: , Rfl:    PREVIDENT 5000 BOOSTER PLUS 1.1 % PSTE, Place 1 Application onto teeth daily., Disp: , Rfl:    rosuvastatin (CRESTOR) 20 MG tablet, Take 1 tablet (20 mg total) by mouth daily. (Patient  taking differently: Take 20 mg by mouth at bedtime.), Disp: 90 tablet, Rfl: 3   sildenafil (VIAGRA) 50 MG tablet, Take 1 tablet (50 mg total) by mouth daily as needed for erectile dysfunction (Use 1 hour before sexual activity.)., Disp: 30 tablet, Rfl: 2   WELLBUTRIN XL 150 MG 24 hr tablet, Take 150 mg by mouth daily., Disp: , Rfl:    traZODone (DESYREL) 50 MG tablet, Take 1 tablet (50 mg total) by mouth at bedtime. (Patient not taking: Reported on 09/21/2023), Disp: 30 tablet, Rfl: 5  Allergies  Allergen Reactions   Levofloxacin     Avoid fluoroquinolone antibiotics due to thoracic aortic aneurysm   Penicillins Other (See Comments)    Childhood   Quinolones     Patient with ATAA (ascending thoracic aortic aneurysm)   Latex Rash   ROS neg/noncontributory except as noted HPI/below Coughs first am.      Objective:     BP 128/60 (BP Location: Left Arm, Patient Position: Sitting, Cuff Size: Normal)   Pulse 67   Temp 97.9 F (36.6 C) (Temporal)   Resp 18   Ht 5\' 10"  (1.778 m)   Wt 173 lb 2 oz (78.5 kg)   SpO2 98%   BMI 24.84 kg/m  Wt Readings from Last 3 Encounters:  09/21/23 173 lb 2 oz (78.5 kg)  08/18/23 170 lb (77.1 kg)  08/09/23 173 lb (78.5 kg)    Physical Exam   Gen: WDWN NAD HEENT: NCAT, conjunctiva not injected, sclera nonicteric NECK:  supple, no thyromegaly, no nodes, no carotid  bruits CARDIAC: RRR, S1S2+, no murmur. DP 2+B LUNGS: CTAB. No wheezes ABDOMEN:  BS+, soft, NTND, No HSM, no masses EXT:  no edema MSK: no gross abnormalities.  NEURO: A&O x3.  CN II-XII intact. + hand tremors-mostly w/intention PSYCH: anxious mood. Good eye contact  Reviewed ER notes/studies, neuro notes and others    Assessment & Plan:  Aneurysm of ascending aorta without rupture (HCC)  Primary hypertension  Pure hypercholesterolemia  Benign prostatic hyperplasia with nocturia  CLL (chronic lymphocytic leukemia) (HCC)  Depression, recurrent (HCC)  Severe obstructive sleep apnea-hypopnea syndrome  Tremor  Syncope, near  Assessment and Plan    Syncope   He experienced a syncopal episode on February 12th, likely due to sleep deprivation and constipation, resulting in a brief loss of consciousness and a head injury. An extensive ER workup, including CTs, MRI, and blood work, showed no intracranial bleed or acute issues. The episode is considered resolved with no ongoing concerns. Ensure adequate hydration and regular bowel movements to prevent constipation.  Hypertension   He is on amlodipine 5 mg daily, maintaining blood pressure readings generally below 130/80 mmHg, the target due to a thoracic aneurysm. There is no indication of hypotension. Continue amlodipine 5 mg daily and monitor blood pressure weekly to ensure it remains around 130/80 mmHg.  Chronic Lymphocytic Leukemia (CLL)   He is under surveillance for CLL with follow-ups every six months at [facility]. Recent lab results showed improvement in certain markers. He experiences mild night sweats, a symptom of CLL, but not severe(could also be from anxiety/meds). Continue regular follow-ups every six months at [facility] for CLL monitoring and monitor for any worsening of night sweats or other symptoms.  Sleep Apnea   He has severe sleep apnea and is advised to use a CPAP machine-but can't tolerate d/t claustrophobia  . He sleeps  on a wedge to help alleviate symptoms. Trazodone is considered to help with sleep and  CPAP tolerance, pending confirmation with a psychiatrist(per neuro notes).  Pt has not done this. Attempt to use the CPAP machine regularly and consult with a psychiatrist regarding the use of trazodone for sleep.  Tremors   He experiences tremors, possibly related to stress, familial history, or antidepressant use. He prefers not to start new medications for tremors and is managing with lifestyle adaptations. No neurologist consultation has been made specifically for tremors but she did note them in her last note. Consider referral to a neurologist if tremors worsen or become more disruptive and discuss potential use of weighted utensils to assist with eating.  Alcohol Use   He uses alcohol as a coping mechanism for stress and emotional issues related to family matters. He acknowledges the need to reduce consumption and is working with a therapist. Liver function tests show slight elevation, possibly related to alcohol use. Continue working with a therapist to reduce alcohol consumption, consider attending AA meetings if the current plan does not succeed, and monitor liver function tests periodically.  Benign Prostatic Hyperplasia (BPH)   He had prostate surgery (TURP) which initially improved nocturia, but symptoms have partially returned. He is not currently on any prostate medications and is uncertain about previous medication. Consult with a urologist regarding current symptoms and potential need for medication. Review medication list to ensure prostate medications are included if needed.  Knee Injury   He has a swollen left knee following two falls. Evaluated by [facility], there is no pain or indicati on of a blood clot. Swelling is persistent but expected to resolve over time. Monitor knee swelling and seek further evaluation if symptoms worsen or do not improve.  General Health Maintenance   He is on  medications for hypertension and cholesterol management. He is advised to monitor blood pressure regularly and maintain lifestyle modifications to support overall health. Continue current medications for hypertension and cholesterol and maintain regular follow-ups for chronic conditions.  Follow-up   He is due for a six-month physical examination. Schedule a six-month physical examination.        Return in about 6 months (around 03/23/2024) for annual physical.  Angelena Sole, MD

## 2023-09-21 NOTE — Patient Instructions (Signed)

## 2023-09-22 ENCOUNTER — Ambulatory Visit: Payer: Medicare HMO | Admitting: Psychology

## 2023-09-30 ENCOUNTER — Ambulatory Visit: Payer: Medicare HMO

## 2023-09-30 VITALS — BP 128/60 | Resp 17 | Ht 70.0 in | Wt 170.0 lb

## 2023-09-30 DIAGNOSIS — Z0000 Encounter for general adult medical examination without abnormal findings: Secondary | ICD-10-CM | POA: Diagnosis not present

## 2023-09-30 NOTE — Progress Notes (Signed)
 Because this visit was a virtual/telehealth visit,  certain criteria was not obtained, such a blood pressure, CBG if applicable, and timed get up and go. Any medications not marked as "taking" were not mentioned during the medication reconciliation part of the visit. Any vitals not documented were not able to be obtained due to this being a telehealth visit or patient was unable to self-report a recent blood pressure reading due to a lack of equipment at home via telehealth. Vitals that have been documented are verbally provided by the patient.   Subjective:   Eugene Ingalls. is a 68 y.o. who presents for a Medicare Wellness preventive visit.  Visit Complete: Virtual I connected with  Eugene Guzman. on 09/30/23 by a audio enabled telemedicine application and verified that I am speaking with the correct person using two identifiers.  Patient Location: Home  Provider Location: Home Office  I discussed the limitations of evaluation and management by telemedicine. The patient expressed understanding and agreed to proceed.  Vital Signs: Because this visit was a virtual/telehealth visit, some criteria may be missing or patient reported. Any vitals not documented were not able to be obtained and vitals that have been documented are patient reported.  VideoDeclined- This patient declined Librarian, academic. Therefore the visit was completed with audio only.  Persons Participating in Visit: Patient.  AWV Questionnaire: No: Patient Medicare AWV questionnaire was not completed prior to this visit.  Cardiac Risk Factors include: advanced age (>5men, >79 women);hypertension;male gender;dyslipidemia     Objective:    Today's Vitals   09/30/23 1429  BP: 128/60  Resp: 17  Weight: 170 lb (77.1 kg)  Height: 5\' 10"  (1.778 m)   Body mass index is 24.39 kg/m.     09/30/2023    2:38 PM 08/18/2023    8:29 AM 03/19/2023    1:04 PM 03/11/2023    1:00 PM  01/22/2023   12:41 PM 09/28/2022    3:34 PM 04/28/2022    8:52 AM  Advanced Directives  Does Patient Have a Medical Advance Directive? No;Yes No Yes Yes Yes Yes Yes  Type of Diplomatic Services operational officer;Living will  Living will;Healthcare Power of [employer] Power of [place];Living will Healthcare Power of [place];Out of facility DNR (pink MOST or yellow form);Living will Healthcare Power of [employer];Living will Healthcare Power of [place];Living will  Does patient want to make changes to medical advance directive? No - Patient declined  No - Patient declined    No - Patient declined  Copy of Healthcare Power of Attorney in Chart? No - copy requested  No - copy requested No - copy requested  No - copy requested No - copy requested  Would patient like information on creating a medical advance directive? No - Patient declined          Current Medications (verified) Outpatient Encounter Medications as of 09/30/2023  Medication Sig   amLODipine (NORVASC) 5 MG tablet Take 1 tablet (5 mg total) by mouth daily.   aspirin EC 81 MG tablet Take 1 tablet (81 mg total) by mouth daily. Swallow whole.   B COMPLEX VITAMINS PO Take 1 capsule by mouth daily. 100 mg daily   bismuth subsalicylate (PEPTO BISMOL) 262 MG/15ML suspension Take 30 mLs by mouth every 6 (six) hours as needed for indigestion or diarrhea or loose stools.   Cholecalciferol (VITAMIN D3) 50 MCG (2000 UT) CAPS Take 1 capsule by mouth daily.   clonazePAM (KLONOPIN) 0.5  MG tablet Take 0.5 mg by mouth 4 (four) times daily as needed for anxiety.   ezetimibe (ZETIA) 10 MG tablet Take 1 tablet (10 mg total) by mouth daily.   fluvoxaMINE (LUVOX) 100 MG tablet Take 200 mg by mouth at bedtime.   lamoTRIgine (LAMICTAL) 200 MG tablet Take 200 mg by mouth at bedtime.   multivitamin (ONE-A-DAY MEN'S) TABS tablet Take 1 tablet by mouth at bedtime.   PREVIDENT 5000 BOOSTER PLUS 1.1 % PSTE Place 1 Application onto teeth daily.    rosuvastatin (CRESTOR) 20 MG tablet Take 1 tablet (20 mg total) by mouth daily. (Patient taking differently: Take 20 mg by mouth at bedtime.)   sildenafil (VIAGRA) 50 MG tablet Take 1 tablet (50 mg total) by mouth daily as needed for erectile dysfunction (Use 1 hour before sexual activity.).   traZODone (DESYREL) 50 MG tablet Take 1 tablet (50 mg total) by mouth at bedtime.   WELLBUTRIN XL 150 MG 24 hr tablet Take 150 mg by mouth daily.   No facility-administered encounter medications on file as of 09/30/2023.    Allergies (verified) Levofloxacin, Penicillins, Quinolones, and Latex   History: Past Medical History:  Diagnosis Date   Allergy    childhood - penicillin, 2014 - latex   Anxiety    Aortic aneurysm (HCC)    Arthritis 2014   right shoulder   CLL (chronic lymphocytic leukemia) (HCC)    Coronary artery calcification seen on CT scan    Depressed    Hx of adenomatous polyp of colon 02/2018   Dr. Lianne Moris   Hyperlipidemia    Hypertension    Sleep apnea 2022   Have stopped using CPAP   Past Surgical History:  Procedure Laterality Date   CORONARY PRESSURE/FFR STUDY N/A 04/28/2022   Procedure: INTRAVASCULAR PRESSURE WIRE/FFR STUDY;  Surgeon: [name], MD;  Location: [facility] INVASIVE CV LAB;  Service: Cardiovascular;  Laterality: N/A;   LEFT HEART CATH AND CORONARY ANGIOGRAPHY N/A 04/28/2022   Procedure: LEFT HEART CATH AND CORONARY ANGIOGRAPHY;  Surgeon: [name], MD;  Location: [facility] INVASIVE CV LAB;  Service: Cardiovascular;  Laterality: N/A;   TONSILLECTOMY  1961   TRANSURETHRAL RESECTION OF PROSTATE N/A 03/19/2023   Procedure: TRANSURETHRAL RESECTION OF THE PROSTATE (TURP);  Surgeon: [name]., MD;  Location: [facility] ORS;  Service: Urology;  Laterality: N/A;   Family History  Problem Relation Age of Onset   Hypertension Mother    Cancer Mother    Hearing loss Mother    Heart attack Father 50   Arthritis Father    Depression Father    Heart disease Father     Depression Paternal Uncle    Depression Paternal Uncle    Ovarian cancer Maternal Grandmother    Cancer Maternal Grandmother    Hearing loss Maternal Grandfather    Heart disease Maternal Grandfather    Hypertension Maternal Grandfather    Stroke Maternal Grandfather    Colon cancer Paternal Grandfather 62   Cancer Paternal Grandfather    Esophageal cancer Neg Hx    Rectal cancer Neg Hx    Stomach cancer Neg Hx    Social History   Socioeconomic History   Marital status: Married    Spouse name: Claris Che   Number of children: 0   Years of education: Not on file   Highest education level: Bachelor's degree (e.g., BA, AB, BS)  Occupational History   Occupation: retired  Tobacco Use   Smoking status: Former  Current packs/day: 0.00    Average packs/day: 0.3 packs/day for 30.0 years (7.5 ttl pk-yrs)    Types: Cigarettes    Quit date: 01/04/2015    Years since quitting: 8.7    Passive exposure: Never   Smokeless tobacco: Never  Vaping Use   Vaping status: Never Used  Substance and Sexual Activity   Alcohol use: Yes    Alcohol/week: 12.0 standard drinks of alcohol    Types: 12 Cans of beer per week    Comment: daily 4 beers   Drug use: No   Sexual activity: Yes    Birth control/protection: None  Other Topics Concern   Not on file  Social History Narrative   Lives with wife   Right handed   Caffeine: ice tea daily   Social Drivers of Health   Financial Resource Strain: Low Risk  (09/07/2023)   Overall Financial Resource Strain (CARDIA)    Difficulty of Paying Living Expenses: Not hard at all  Food Insecurity: No Food Insecurity (09/07/2023)   Hunger Vital Sign    Worried About Running Out of Food in the Last Year: Never true    Ran Out of Food in the Last Year: Never true  Transportation Needs: No Transportation Needs (09/07/2023)   PRAPARE - Administrator, Civil Service (Medical): No    Lack of Transportation (Non-Medical): No  Physical Activity:  Sufficiently Active (09/07/2023)   Exercise Vital Sign    Days of Exercise per Week: 3 days    Minutes of Exercise per Session: 60 min  Stress: Stress Concern Present (09/07/2023)   [employer] of Occupational Health - Occupational Stress Questionnaire    Feeling of Stress : Very much  Social Connections: Moderately Integrated (09/07/2023)   Social Connection and Isolation Panel [NHANES]    Frequency of Communication with Friends and Family: More than three times a week    Frequency of Social Gatherings with Friends and Family: More than three times a week    Attends Religious Services: 1 to 4 times per year    Active Member of [employer] or Organizations: No    Attends Engineer, structural: Not on file    Marital Status: Married    Tobacco Counseling Counseling given: Not Answered    Clinical Intake:  Pre-visit preparation completed: Yes  Pain : No/denies pain     BMI - recorded: 24.39 Nutritional Status: BMI of 19-24  Normal Nutritional Risks: None Diabetes: No  Lab Results  Component Value Date   HGBA1C 5.3 11/09/2022   HGBA1C 5.5 02/19/2022   HGBA1C 5.7 05/06/2021     How often do you need to have someone help you when you read instructions, pamphlets, or other written materials from your doctor or pharmacy?: 1 - Never What is the last grade level you completed in school?: College     Information entered by :: [employer]   Activities of Daily Living     09/30/2023    2:37 PM 03/19/2023    8:00 PM  In your present state of health, do you have any difficulty performing the following activities:  Hearing? 0 0  Vision? 0 0  Difficulty concentrating or making decisions? 0 0  Walking or climbing stairs? 0 0  Dressing or bathing? 0 0  Doing errands, shopping? 0 0  Preparing Food and eating ? N   Using the Toilet? N   In the past six months, have you accidently leaked urine? Y   Comment  yes in the fall   Do you have problems with loss of bowel  control? N   Managing your Medications? N   Managing your Finances? N   Housekeeping or managing your Housekeeping? N     Patient Care Team: [name], MD as PCP - General (Family Medicine) Pricilla Riffle, MD as PCP - Cardiology (Cardiology)  Indicate any recent Medical Services you may have received from other than [facility] providers in the past year (date may be approximate).     Assessment:   This is a routine wellness examination for [facility].  Hearing/Vision screen Hearing Screening - Comments:: Some hearing issues  Vision Screening - Comments:: Wears glasses    Goals Addressed               This Visit's Progress     Patient Stated (pt-stated)        Patient states he wants to be more healthy        Depression Screen     09/30/2023    2:39 PM 09/21/2023   11:50 AM 03/11/2023    9:26 AM 01/20/2023   10:06 AM 11/09/2022    2:13 PM 11/09/2022    2:11 PM 09/28/2022    3:30 PM  PHQ 2/9 Scores  PHQ - 2 Score 6 6 6 6 6 6 3   PHQ- 9 Score 20 18 20 24 23 23 12     Fall Risk     09/30/2023    2:38 PM 07/27/2023    1:32 PM 03/11/2023    9:26 AM 01/20/2023    9:58 AM 09/24/2022    1:26 PM  Fall Risk   Falls in the past year? 1 1 1 1  0  Number falls in past yr: 1 1 1  0 0  Injury with Fall? 1 1 1 1  0  Risk for fall due to : History of fall(s) History of fall(s) Impaired balance/gait Impaired balance/gait Impaired vision  Follow up Falls evaluation completed Falls evaluation completed Falls prevention discussed Falls prevention discussed Falls prevention discussed    MEDICARE RISK AT HOME:  Medicare Risk at Home Any stairs in or around the home?: Yes If so, are there any without handrails?: No Home free of loose throw rugs in walkways, pet beds, electrical cords, etc?: Yes Adequate lighting in your home to reduce risk of falls?: Yes Life alert?: No Use of a cane, walker or w/c?: No Grab bars in the bathroom?: No Shower chair or bench in shower?: No Elevated toilet seat  or a handicapped toilet?: Yes  TIMED UP AND GO:  Was the test performed?  no  Cognitive Function: 6CIT completed        09/30/2023    2:33 PM 09/28/2022    3:33 PM  6CIT Screen  What Year? 0 points 0 points  What month? 0 points 0 points  What time? 0 points 0 points  Count back from 20 0 points 0 points  Months in reverse 0 points 0 points  Repeat phrase 0 points 0 points  Total Score 0 points 0 points    Immunizations Immunization History  Administered Date(s) Administered   Hepatitis A, Adult 04/28/2023   Hepb-cpg 04/28/2023, 06/02/2023   Influenza, High Dose Seasonal PF 07/08/2022   Influenza, Seasonal, Injecte, Preservative Fre 04/11/2014   Influenza,inj,Quad PF,6+ Mos 05/14/2015, 04/11/2016   Influenza-Unspecified 04/22/2021, 05/10/2023   PFIZER(Purple Top)SARS-COV-2 Vaccination 08/07/2019, 08/28/2019, 05/07/2020, 10/08/2020, 03/18/2021   PNEUMOCOCCAL CONJUGATE-20 05/06/2021   Pfizer(Comirnaty)Fall Seasonal Vaccine 12  years and older 05/18/2022, 05/10/2023   Rsv, Bivalent, Protein Subunit Rsvpref,pf Verdis Frederickson) 06/23/2022   Zoster Recombinant(Shingrix) 12/04/2020, 12/24/2020, 06/10/2021    Screening Tests Health Maintenance  Topic Date Due   DTaP/Tdap/Td (1 - Tdap) 02/07/2024 (Originally 10/30/1974)   COVID-19 Vaccine (8 - Pfizer risk 2024-25 season) 11/07/2023   Colonoscopy  05/31/2024   Medicare Annual Wellness (AWV)  09/29/2024   Pneumonia Vaccine 78+ Years old  Completed   INFLUENZA VACCINE  Completed   Hepatitis C Screening  Completed   Zoster Vaccines- Shingrix  Completed   HPV VACCINES  Aged Out    Health Maintenance  There are no preventive care reminders to display for this patient. Health Maintenance Items Addressed:  Additional Screening:  Vision Screening: Recommended annual ophthalmology exams for early detection of glaucoma and other disorders of the eye.  Dental Screening: Recommended annual dental exams for proper oral  hygiene  Community Resource Referral / Chronic Care Management: CRR required this visit?  No   CCM required this visit?  No     Plan:     I have personally reviewed and noted the following in the patient's chart:   Medical and social history Use of alcohol, tobacco or illicit drugs  Current medications and supplements including opioid prescriptions. Patient is not currently taking opioid prescriptions. Functional ability and status Nutritional status Physical activity Advanced directives List of other physicians Hospitalizations, surgeries, and ER visits in previous 12 months Vitals Screenings to include cognitive, depression, and falls Referrals and appointments  In addition, I have reviewed and discussed with patient certain preventive protocols, quality metrics, and best practice recommendations. A written personalized care plan for preventive services as well as general preventive health recommendations were provided to patient.     Rudi Heap, [place]   09/30/2023   After Visit Summary: (MyChart) Due to this being a telephonic visit, the after visit summary with patients personalized plan was offered to patient via MyChart   Notes: Nothing significant to report at this time.

## 2023-09-30 NOTE — Patient Instructions (Signed)
 [name] , Thank you for taking time to come for your Medicare Wellness Visit. I appreciate your ongoing commitment to your health goals. Please review the following plan we discussed and let me know if I can assist you in the future.   Referrals/Orders/Follow-Ups/Clinician Recommendations:   This is a list of the screening recommended for you and due dates:  Health Maintenance  Topic Date Due   Medicare Annual Wellness Visit  09/28/2023   DTaP/Tdap/Td vaccine (1 - Tdap) 02/07/2024*   COVID-19 Vaccine (8 - Pfizer risk 2024-25 season) 11/07/2023   Colon Cancer Screening  05/31/2024   Pneumonia Vaccine  Completed   Flu Shot  Completed   Hepatitis C Screening  Completed   Zoster (Shingles) Vaccine  Completed   HPV Vaccine  Aged Out  *Topic was postponed. The date shown is not the original due date.    Advanced directives: (Declined) Advance directive discussed with you today. Even though you declined this today, please call our office should you change your mind, and we can give you the proper paperwork for you to fill out.  Next Medicare Annual Wellness Visit scheduled for next year: Yes

## 2023-10-07 ENCOUNTER — Ambulatory Visit: Payer: Medicare HMO | Admitting: Psychology

## 2023-10-13 ENCOUNTER — Other Ambulatory Visit: Payer: Medicare HMO

## 2023-10-13 ENCOUNTER — Ambulatory Visit: Payer: Medicare HMO | Admitting: Hematology

## 2023-10-15 ENCOUNTER — Other Ambulatory Visit: Payer: Self-pay | Admitting: Internal Medicine

## 2023-10-29 ENCOUNTER — Ambulatory Visit: Payer: Medicare Other

## 2023-10-29 DIAGNOSIS — Z23 Encounter for immunization: Secondary | ICD-10-CM | POA: Diagnosis not present

## 2023-11-03 DIAGNOSIS — F332 Major depressive disorder, recurrent severe without psychotic features: Secondary | ICD-10-CM | POA: Diagnosis not present

## 2023-11-03 DIAGNOSIS — F410 Panic disorder [episodic paroxysmal anxiety] without agoraphobia: Secondary | ICD-10-CM | POA: Diagnosis not present

## 2023-11-09 DIAGNOSIS — C9110 Chronic lymphocytic leukemia of B-cell type not having achieved remission: Secondary | ICD-10-CM | POA: Diagnosis not present

## 2023-11-09 DIAGNOSIS — G4733 Obstructive sleep apnea (adult) (pediatric): Secondary | ICD-10-CM | POA: Diagnosis not present

## 2023-11-09 DIAGNOSIS — I119 Hypertensive heart disease without heart failure: Secondary | ICD-10-CM | POA: Diagnosis not present

## 2023-11-09 DIAGNOSIS — Z1509 Genetic susceptibility to other malignant neoplasm: Secondary | ICD-10-CM | POA: Diagnosis not present

## 2023-11-09 DIAGNOSIS — Z87891 Personal history of nicotine dependence: Secondary | ICD-10-CM | POA: Diagnosis not present

## 2023-11-09 DIAGNOSIS — I719 Aortic aneurysm of unspecified site, without rupture: Secondary | ICD-10-CM | POA: Diagnosis not present

## 2023-11-09 DIAGNOSIS — I2510 Atherosclerotic heart disease of native coronary artery without angina pectoris: Secondary | ICD-10-CM | POA: Diagnosis not present

## 2023-11-09 DIAGNOSIS — R7989 Other specified abnormal findings of blood chemistry: Secondary | ICD-10-CM | POA: Diagnosis not present

## 2023-11-09 DIAGNOSIS — F32A Depression, unspecified: Secondary | ICD-10-CM | POA: Diagnosis not present

## 2023-11-16 ENCOUNTER — Other Ambulatory Visit: Payer: Self-pay | Admitting: Thoracic Surgery (Cardiothoracic Vascular Surgery)

## 2023-11-16 DIAGNOSIS — I7121 Aneurysm of the ascending aorta, without rupture: Secondary | ICD-10-CM

## 2023-11-18 DIAGNOSIS — M1711 Unilateral primary osteoarthritis, right knee: Secondary | ICD-10-CM | POA: Diagnosis not present

## 2023-11-18 DIAGNOSIS — M25562 Pain in left knee: Secondary | ICD-10-CM | POA: Diagnosis not present

## 2023-11-25 ENCOUNTER — Ambulatory Visit: Admitting: Psychology

## 2023-12-01 DIAGNOSIS — K080 Exfoliation of teeth due to systemic causes: Secondary | ICD-10-CM | POA: Diagnosis not present

## 2023-12-08 ENCOUNTER — Ambulatory Visit: Admitting: Physician Assistant

## 2023-12-08 ENCOUNTER — Encounter: Payer: Self-pay | Admitting: Physician Assistant

## 2023-12-08 VITALS — BP 108/54 | HR 72 | Ht 69.0 in | Wt 174.4 lb

## 2023-12-08 DIAGNOSIS — R1013 Epigastric pain: Secondary | ICD-10-CM | POA: Diagnosis not present

## 2023-12-08 DIAGNOSIS — R7401 Elevation of levels of liver transaminase levels: Secondary | ICD-10-CM | POA: Diagnosis not present

## 2023-12-08 DIAGNOSIS — K219 Gastro-esophageal reflux disease without esophagitis: Secondary | ICD-10-CM

## 2023-12-08 DIAGNOSIS — K760 Fatty (change of) liver, not elsewhere classified: Secondary | ICD-10-CM

## 2023-12-08 DIAGNOSIS — F1090 Alcohol use, unspecified, uncomplicated: Secondary | ICD-10-CM

## 2023-12-08 DIAGNOSIS — Z860100 Personal history of colon polyps, unspecified: Secondary | ICD-10-CM

## 2023-12-08 MED ORDER — NA SULFATE-K SULFATE-MG SULF 17.5-3.13-1.6 GM/177ML PO SOLN
1.0000 | Freq: Once | ORAL | 0 refills | Status: AC
Start: 2023-12-08 — End: 2023-12-08

## 2023-12-08 NOTE — Patient Instructions (Signed)
 You have been scheduled for an endoscopy and colonoscopy. Please follow the written instructions given to you at your visit today.  If you use inhalers (even only as needed), please bring them with you on the day of your procedure.  DO NOT TAKE 7 DAYS PRIOR TO TEST- Trulicity (dulaglutide) Ozempic, Wegovy (semaglutide) Mounjaro (tirzepatide) Bydureon Bcise (exanatide extended release)  DO NOT TAKE 1 DAY PRIOR TO YOUR TEST Rybelsus (semaglutide) Adlyxin (lixisenatide) Victoza (liraglutide) Byetta (exanatide) _______________________________________________________________  If your blood pressure at your visit was 140/90 or greater, please contact your primary care physician to follow up on this.  _______________________________________________________  If you are age 38 or older, your body mass index should be between 23-30. Your Body mass index is 25.75 kg/m. If this is out of the aforementioned range listed, please consider follow up with your Primary Care Provider.  If you are age 77 or younger, your body mass index should be between 19-25. Your Body mass index is 25.75 kg/m. If this is out of the aformentioned range listed, please consider follow up with your Primary Care Provider.   ________________________________________________________  The  GI providers would like to encourage you to use MYCHART to communicate with providers for non-urgent requests or questions.  Due to long hold times on the telephone, sending your provider a message by [facility] may be a faster and more efficient way to get a response.  Please allow 48 business hours for a response.  Please remember that this is for non-urgent requests.  _______________________________________________________

## 2023-12-08 NOTE — Progress Notes (Signed)
 Brigitte Canard, PA-C [address] [place], [place]  [address] Phone: [phone]   Primary Care Physician: Christel Cousins, MD  Primary Gastroenterologist:  Brigitte Canard, PA-C / Alvester Johnson, MD   Chief Complaint: Follow-up elevated LFTs, fatty liver, alcohol use       HPI:   Eugene Guzman. is a 68 y.o. male returns for follow-up of elevated liver transaminases, hepatic steatosis, and history of alcohol use.  He is here today with his wife.  PMH: Anxiety, elevated liver enzymes, fatty liver, history of alcohol use.  History of CLL (followed by oncology), colon polyps.  Current symptoms: He has episodes of epigastric pain and acid reflux.  Has tried OTC antacid and Pepto with little benefit.  He has drink 3 or 4 beers per day for many years.  He reports drinking alcohol (beer) last night, still drinking.    Last saw Dr. General Kenner for follow-up 08/09/2023 (4 months ago).  Elevated liver transaminases since July 2024.  Mild ALT and AST elevation.  Previously received hepatitis A and B vaccine.  11/09/2023 labs: Glucose 160, AST 93, ALT 86, total bilirubin 0.9, alk phos 90.  WBC 11.9, Hgb 14.1, platelets 217.  08/18/2023 CT abdomen pelvis with contrast: 1. No acute inflammatory process identified within the abdomen or pelvis.  Mild diffuse hepatic steatosis.  No cirrhosis or liver masses. 2. No nephroureterolithiasis or obstructive uropathy. 3. There are small bilateral simple renal cysts. 4. Other nonacute observations, as described above. 5.  No gallstones.  Normal pancreas.  04/2023 RUQ US : Hepatic steatosis.  No cirrhosis or liver masses. No gallstones.  05/2023 colonoscopy by Dr. General Kenner:  3 polyps removed, 1 of which was large, roughly 3 cm removed in piecemeal from the hepatic flexure, consistent with sessile serrated polyp. Tattoo placed. Repeat exam in 6 to 12 months.   Colonoscopy 02/28/2018 - Dr. Andriette Keeling - "5 polyps" 3-6 mm in size, diverticulosis -  adenomas and hyperplastic polyps - can't determine how much of each   Colonoscopy 12/2012 - Dr. Andriette Keeling - sigmoid 9mm polyp, hemorrhoids - adenoma   Negative hep B and hep C studies in April 2024 ANA negative 02/2022  Current Outpatient Medications  Medication Sig Dispense Refill   amLODipine  (NORVASC ) 5 MG tablet Take 1 tablet (5 mg total) by mouth daily. 90 tablet 3   aspirin  EC 81 MG tablet Take 1 tablet (81 mg total) by mouth daily. Swallow whole. 90 tablet 3   B COMPLEX VITAMINS PO Take 1 capsule by mouth daily. 100 mg daily     bismuth subsalicylate (PEPTO BISMOL) 262 MG/15ML suspension Take 30 mLs by mouth every 6 (six) hours as needed for indigestion or diarrhea or loose stools.     celecoxib (CELEBREX) 200 MG capsule Take 200 mg by mouth daily.     Cholecalciferol (VITAMIN D3) 50 MCG (2000 UT) CAPS Take 1 capsule by mouth daily.     clonazePAM  (KLONOPIN ) 0.5 MG tablet Take 0.5 mg by mouth 4 (four) times daily as needed for anxiety.     ezetimibe  (ZETIA ) 10 MG tablet Take 1 tablet (10 mg total) by mouth daily. 30 tablet 5   fluvoxaMINE  (LUVOX ) 100 MG tablet Take 200 mg by mouth at bedtime.     lamoTRIgine  (LAMICTAL ) 200 MG tablet Take 200 mg by mouth at bedtime.     multivitamin (ONE-A-DAY MEN'S) TABS tablet Take 1 tablet by mouth at bedtime.     Na Sulfate-K Sulfate-Mg  Sulfate concentrate (SUPREP) 17.5-3.13-1.6 GM/177ML SOLN Take 1 kit (354 mLs total) by mouth once for 1 dose. 354 mL 0   rosuvastatin  (CRESTOR ) 20 MG tablet Take 1 tablet (20 mg total) by mouth daily. (Patient taking differently: Take 20 mg by mouth at bedtime.) 90 tablet 3   sildenafil  (VIAGRA ) 50 MG tablet Take 1 tablet (50 mg total) by mouth daily as needed for erectile dysfunction (Use 1 hour before sexual activity.). 30 tablet 2   WELLBUTRIN XL 150 MG 24 hr tablet Take 150 mg by mouth daily.     PREVIDENT 5000 BOOSTER PLUS 1.1 % PSTE Place 1 Application onto teeth daily. (Patient not taking: Reported on 12/08/2023)      traZODone  (DESYREL ) 50 MG tablet Take 1 tablet (50 mg total) by mouth at bedtime. (Patient not taking: Reported on 12/08/2023) 30 tablet 5   No current facility-administered medications for this visit.    Allergies as of 12/08/2023 - Review Complete 12/08/2023  Allergen Reaction Noted   Levofloxacin  12/28/2022   Penicillins Other (See Comments) 09/06/2013   Quinolones  07/05/2023   Latex Rash 04/28/2022    Past Medical History:  Diagnosis Date   Allergy    childhood - penicillin, 2014 - latex   Anxiety    Aortic aneurysm (HCC)    Arthritis 2014   right shoulder   CLL (chronic lymphocytic leukemia) (HCC)    Coronary artery calcification seen on CT scan    Depressed    Enlarged prostate    Hx of adenomatous polyp of colon 02/2018   Dr. Sharol Decamp   Hyperlipidemia    Hypertension    Sleep apnea 2022   Have stopped using CPAP    Past Surgical History:  Procedure Laterality Date   CORONARY PRESSURE/FFR STUDY N/A 04/28/2022   Procedure: INTRAVASCULAR PRESSURE WIRE/FFR STUDY;  Surgeon: Arleen Lacer, MD;  Location: [facility] INVASIVE CV LAB;  Service: Cardiovascular;  Laterality: N/A;   LEFT HEART CATH AND CORONARY ANGIOGRAPHY N/A 04/28/2022   Procedure: LEFT HEART CATH AND CORONARY ANGIOGRAPHY;  Surgeon: Arleen Lacer, MD;  Location: [facility] INVASIVE CV LAB;  Service: Cardiovascular;  Laterality: N/A;   TONSILLECTOMY  1961   TRANSURETHRAL RESECTION OF PROSTATE N/A 03/19/2023   Procedure: TRANSURETHRAL RESECTION OF THE PROSTATE (TURP);  Surgeon: Melody Spurling., MD;  Location: [facility] ORS;  Service: Urology;  Laterality: N/A;    Review of Systems:    All systems reviewed and negative except where noted in HPI.    Physical Exam:  BP (!) 108/54 (BP Location: Left Arm, Patient Position: Sitting, Cuff Size: Normal)   Pulse 72   Ht 5\' 9"  (1.753 m) Comment: height measured without shoes  Wt 174 lb 6 oz (79.1 kg)   BMI 25.75 kg/m  No LMP for male patient.  General: Well-nourished,  well-developed in no acute distress.  Lungs: Clear to auscultation bilaterally. Non-labored. Heart: Regular rate and rhythm, no murmurs rubs or gallops.  Abdomen: Bowel sounds are normal; Abdomen is Soft; No hepatosplenomegaly, masses or hernias;  No Abdominal Tenderness; No guarding or rebound tenderness. Neuro: Alert and oriented x 3.  Grossly intact.  Psych: Alert and cooperative, normal mood and affect.   Imaging Studies: No results found.  Labs: CBC    Component Value Date/Time   WBC 13.8 (H) 08/18/2023 0929   RBC 4.92 08/18/2023 0929   HGB 15.3 08/18/2023 0929   HGB 13.6 11/02/2022 1219   HGB 14.4 04/23/2022 1409   HCT 44.8  08/18/2023 0929   HCT 42.1 04/23/2022 1409   PLT 198 08/18/2023 0929   PLT 167 11/02/2022 1219   PLT 189 04/23/2022 1409   MCV 91.1 08/18/2023 0929   MCV 91 04/23/2022 1409   MCH 31.1 08/18/2023 0929   MCHC 34.2 08/18/2023 0929   RDW 13.2 08/18/2023 0929   RDW 12.9 04/23/2022 1409   LYMPHSABS 8.3 (H) 08/18/2023 0929   MONOABS 0.7 08/18/2023 0929   EOSABS 0.1 08/18/2023 0929   BASOSABS 0.1 08/18/2023 0929    CMP     Component Value Date/Time   NA 135 08/18/2023 0929   NA 138 04/23/2022 1409   K 4.2 08/18/2023 0929   CL 100 08/18/2023 0929   CO2 18 (L) 08/18/2023 0929   GLUCOSE 98 08/18/2023 0929   BUN 10 08/18/2023 0929   BUN 15 04/23/2022 1409   CREATININE 0.81 08/18/2023 0929   CREATININE 0.83 11/02/2022 1219   CALCIUM  9.5 08/18/2023 0929   PROT 7.7 08/18/2023 0929   PROT 6.6 01/19/2022 1149   ALBUMIN 4.6 08/18/2023 0929   ALBUMIN 4.6 01/19/2022 1149   AST 42 (H) 08/18/2023 0929   AST 30 11/02/2022 1219   ALT 42 08/18/2023 0929   ALT 26 11/02/2022 1219   ALKPHOS 87 08/18/2023 0929   BILITOT 0.9 08/18/2023 0929   BILITOT 1.0 11/02/2022 1219   GFRNONAA >60 08/18/2023 0929   GFRNONAA >60 11/02/2022 1219   GFRAA >60 03/04/2015 1924       Assessment and Plan:   Judene Noss. is a 68 y.o. y/o male   1.  Elevated  liver transaminases: Most likely due to fatty liver disease and alcohol use.  Recent liver transaminases stable and improved.  2.  Fatty liver disease -Plan for liver ultrasound with elastography in August 2025. - Ultrasound of liver every 6 months to screen for hepatoma. - Had a negative abdominal pelvic CT 08/2023. - Recommend a low-fat diet, regular exercise, and weight loss. Patient education handout about fatty liver disease was given and discussed from up-to-date.   3.  Alcohol use - Lengthy discussion regarding avoiding alcohol. - Discussed resources for support including Alcoholics Anonymous. - Discussed increased risk of developing liver cirrhosis if he continues to drink alcohol.  4.  History of colon polyps - Scheduling Colonoscopy I discussed risks of colonoscopy with patient to include risk of bleeding, colon perforation, and risk of sedation.  Patient expressed understanding and agrees to proceed with colonoscopy.   5.  Epigastric Pain / GERD - Scheduling EGD to Screen for Barretts, gastropathy and esophageal varices. I discussed risks of EGD with patient to include risk of bleeding, perforation, and risk of sedation.  Patient expressed understanding and agrees to proceed with EGD.   Brigitte Canard, PA-C  Follow up in 3 months with TG or Dr. General Kenner.

## 2023-12-09 NOTE — Progress Notes (Signed)
 Agree with assessment and plan as outlined.

## 2023-12-17 ENCOUNTER — Encounter: Payer: Self-pay | Admitting: Internal Medicine

## 2023-12-20 NOTE — Progress Notes (Signed)
 [address]               Eugene Guzman [place], [place] [address]                      [phone]   PCP is Wendolyn Jenkins Jansky, MD Referring Provider is Wendolyn Jenkins Jansky, MD  Chief Complaint: Ascending thoracic aortic aneurysm   HPI: This is a 68 year old male with a past medical history of anxiety, hyperlipidemia, hypertension, OSA, CLL, enlarged prostate, remote tobacco abuse, and coronary artery calcification who on CT coronary calcium  was found to have an incidental 4.5 cm ascending thoracic aortic aneurysm in June 2021. He has been followed by [facility] since then. He was last seen in December 2024 by myself. His ATAA measured 4.4 cm at that time. He denies chest pain, pressure, or tightness. Since he was last here, he has been seem at [facility] for follow up of CLL. He has had a change in appetite and night sweats related to CLL. He has follow up with GI as recent colonoscopy showed polyps and he has to have an EGD in July.  Past Medical History:  Diagnosis Date   Allergy    childhood - penicillin, 2014 - latex   Anxiety    Aortic aneurysm (HCC)    Arthritis 2014   right shoulder   CLL (chronic lymphocytic leukemia) (HCC)    Coronary artery calcification seen on CT scan    Depressed    Enlarged prostate    Hx of adenomatous polyp of colon 02/2018   Dr. Milon   Hyperlipidemia    Hypertension    Sleep apnea 2022   Have stopped using CPAP    Past Surgical History:  Procedure Laterality Date   CORONARY PRESSURE/FFR STUDY N/A 04/28/2022   Procedure: INTRAVASCULAR PRESSURE WIRE/FFR STUDY;  Surgeon: Anner Alm ORN, MD;  Location: [facility] INVASIVE CV LAB;  Service: Cardiovascular;  Laterality: N/A;   LEFT HEART CATH AND CORONARY ANGIOGRAPHY N/A 04/28/2022   Procedure: LEFT HEART CATH AND CORONARY ANGIOGRAPHY;  Surgeon: Anner Alm ORN, MD;  Location: [facility] INVASIVE CV LAB;  Service: Cardiovascular;  Laterality: N/A;   TONSILLECTOMY  1961   TRANSURETHRAL RESECTION OF PROSTATE  N/A 03/19/2023   Procedure: TRANSURETHRAL RESECTION OF THE PROSTATE (TURP);  Surgeon: Alvaro Ricardo KATHEE Mickey., MD;  Location: [facility];  Service: Urology;  Laterality: N/A;    Family History  Problem Relation Age of Onset   Hypertension Mother    Cancer Mother    Hearing loss Mother    Heart attack Father 77   Arthritis Father    Depression Father    Heart disease Father    Depression Paternal Uncle    Depression Paternal Uncle    Ovarian cancer Maternal Grandmother    Cancer Maternal Grandmother    Hearing loss Maternal Grandfather    Heart disease Maternal Grandfather    Hypertension Maternal Grandfather    Stroke Maternal Grandfather    Colon cancer Paternal Grandfather 55   Cancer Paternal Grandfather    Esophageal cancer Neg Hx    Rectal cancer Neg Hx    Stomach cancer Neg Hx     Social History Social History   Tobacco Use   Smoking status: Former    Current packs/day: 0.00    Average packs/day: 0.3 packs/day for 30.0 years (7.5 ttl pk-yrs)    Types: Cigarettes  Quit date: 01/04/2015    Years since quitting: 8.9    Passive exposure: Never   Smokeless tobacco: Never  Vaping Use   Vaping status: Never Used  Substance Use Topics   Alcohol use: Yes    Alcohol/week: 12.0 standard drinks of alcohol    Types: 12 Cans of beer per week    Comment: daily 4 beers   Drug use: No    Current Outpatient Medications  Medication Sig Dispense Refill   amLODipine  (NORVASC ) 5 MG tablet Take 1 tablet (5 mg total) by mouth daily. 90 tablet 3   aspirin  EC 81 MG tablet Take 1 tablet (81 mg total) by mouth daily. Swallow whole. 90 tablet 3   B COMPLEX VITAMINS PO Take 1 capsule by mouth daily. 100 mg daily     bismuth subsalicylate (PEPTO BISMOL) 262 MG/15ML suspension Take 30 mLs by mouth every 6 (six) hours as needed for indigestion or diarrhea or loose stools.     celecoxib (CELEBREX) 200 MG capsule Take 200 mg by mouth daily.     Cholecalciferol (VITAMIN D3) 50 MCG (2000 UT) CAPS  Take 1 capsule by mouth daily.     clonazePAM  (KLONOPIN ) 0.5 MG tablet Take 0.5 mg by mouth 4 (four) times daily as needed for anxiety.     ezetimibe  (ZETIA ) 10 MG tablet Take 1 tablet (10 mg total) by mouth daily. 30 tablet 5   fluvoxaMINE  (LUVOX ) 100 MG tablet Take 200 mg by mouth at bedtime.     lamoTRIgine  (LAMICTAL ) 200 MG tablet Take 200 mg by mouth at bedtime.     multivitamin (ONE-A-DAY MEN'S) TABS tablet Take 1 tablet by mouth at bedtime.     PREVIDENT 5000 BOOSTER PLUS 1.1 % PSTE Place 1 Application onto teeth daily. (Patient not taking: Reported on 12/08/2023)     rosuvastatin  (CRESTOR ) 20 MG tablet Take 1 tablet (20 mg total) by mouth daily. (Patient taking differently: Take 20 mg by mouth at bedtime.) 90 tablet 3   sildenafil  (VIAGRA ) 50 MG tablet Take 1 tablet (50 mg total) by mouth daily as needed for erectile dysfunction (Use 1 hour before sexual activity.). 30 tablet 2   traZODone  (DESYREL ) 50 MG tablet Take 1 tablet (50 mg total) by mouth at bedtime. (Patient not taking: Reported on 12/08/2023) 30 tablet 5   WELLBUTRIN XL 150 MG 24 hr tablet Take 150 mg by mouth daily.      Allergies  Allergen Reactions   Levofloxacin     Avoid fluoroquinolone antibiotics due to thoracic aortic aneurysm   Penicillins Other (See Comments)    Childhood   Quinolones     Patient with ATAA (ascending thoracic aortic aneurysm)   Latex Rash    Vital Signs: Vitals:   12/28/23 1240  BP: 118/63  Pulse: 64  Resp: 20  SpO2: 93%     Physical Exam: CV-RRR, grade I/VI systolic murmur heard best along left sternal border Neck-No carotid bruit Pulmonary-Clear to auscultation bilaterally Abdomen-Soft, non tender, bowel sounds present Extremities-No LE edema Neurologic-Grossly intact without focal deficit   Diagnostic Tests: Narrative & Impression  CLINICAL DATA:  Follow-up aneurysmal disease of the ascending thoracic aorta.   EXAM: CT ANGIOGRAPHY CHEST WITH CONTRAST    TECHNIQUE: Multidetector CT imaging of the chest was performed using the standard protocol during bolus administration of intravenous contrast. Multiplanar CT image reconstructions and MIPs were obtained to evaluate the vascular anatomy.   RADIATION DOSE REDUCTION: This exam was performed according to the departmental  dose-optimization program which includes automated exposure control, adjustment of the mA and/or kV according to patient size and/or use of iterative reconstruction technique.   CONTRAST:  75mL OMNIPAQUE  IOHEXOL  350 MG/ML SOLN   COMPARISON:  06/24/2023   FINDINGS: Cardiovascular: The aortic root is dilated measuring up to 4.4-4.7 cm at the level of the sinuses of Valsalva with associated heavily calcified and thickened aortic valve. There is some asymmetric dilatation of the non coronary cusp. The ascending thoracic aorta demonstrates stable dilatation measuring up to 4.4 cm and estimated maximum diameter. The proximal arch measures 3.1 cm and the distal arch 2.6 cm. The descending thoracic aorta measures 2.3 cm. Visualized proximal great vessels demonstrate normal patency and normal branching anatomy. No evidence of aortic dissection.   The heart size is normal. There may be some degree of underlying left ventricular hypertrophy. No pericardial fluid identified. Calcified coronary artery plaque present in a 3 vessel distribution. Central pulmonary arteries are normal in caliber.   Mediastinum/Nodes: No enlarged mediastinal, hilar, or axillary lymph nodes. Thyroid  gland, trachea, and esophagus demonstrate no significant findings.   Lungs/Pleura: There is no evidence of pulmonary edema, consolidation, pneumothorax, nodule or pleural fluid.   Upper Abdomen: The liver demonstrates stable steatosis.   Musculoskeletal: No chest wall abnormality. No acute or significant osseous findings.   Review of the MIP images confirms the above findings.   IMPRESSION: 1.  Dilated aortic root measuring up to 4.4-4.7 cm at the level of the sinuses of Valsalva with associated heavily calcified and thickened aortic valve. There is some asymmetric dilatation of the non coronary cusp. 2. Stable dilatation of the ascending thoracic aorta measuring up to 4.4 cm in estimated maximum diameter. Recommend annual imaging followup by CTA or MRA. This recommendation follows 2010 ACCF/AHA/AATS/ACR/ASA/SCA/SCAI/SIR/STS/SVM Guidelines for the Diagnosis and Management of Patients with Thoracic Aortic Disease. Circulation. 2010; 121: Z733-z630. Aortic aneurysm NOS (ICD10-I71.9) 3. Coronary atherosclerosis in a 3 vessel distribution. 4. Hepatic steatosis.     Electronically Signed   By: Marcey Moan M.D.   On: 12/28/2023 10:03   Impression and Plan: CTA showed a 4.4 cm ascending aortic aneurysm with a dilated aortic root measuring up to 4.4-4.7 cm at the level of the sinuses of Valsalva with associated heavily calcified and thickened aortic valve. Echocardiogram done in January 2023 showed a bicuspid valve with evidence of mild aortic regurgitation. I will defer to Dr. Okey when to repeat echocardiogram  (is not symptomatic from AI at this time). We discussed the natural history and and risk factors for growth of ascending aortic aneurysms.  We covered the importance of smoking cessation, tight blood pressure control, refraining from lifting heavy objects, and avoiding fluoroquinolones.  The patient is aware of signs and symptoms of aortic dissection and when to present to the emergency department.  We will continue surveillance and a repeat CTA was ordered for 6 months.   Kyla CHRISTELLA Donald, PA-C [facility] [phone]

## 2023-12-21 ENCOUNTER — Ambulatory Visit (HOSPITAL_COMMUNITY)
Admission: RE | Admit: 2023-12-21 | Discharge: 2023-12-21 | Disposition: A | Discharge status: Home / Self Care | Source: Ambulatory Visit | Attending: Thoracic Surgery (Cardiothoracic Vascular Surgery) | Admitting: Thoracic Surgery (Cardiothoracic Vascular Surgery)

## 2023-12-21 DIAGNOSIS — K760 Fatty (change of) liver, not elsewhere classified: Secondary | ICD-10-CM | POA: Diagnosis not present

## 2023-12-21 DIAGNOSIS — I7121 Aneurysm of the ascending aorta, without rupture: Secondary | ICD-10-CM | POA: Insufficient documentation

## 2023-12-21 DIAGNOSIS — I2510 Atherosclerotic heart disease of native coronary artery without angina pectoris: Secondary | ICD-10-CM | POA: Insufficient documentation

## 2023-12-21 DIAGNOSIS — I77810 Thoracic aortic ectasia: Secondary | ICD-10-CM | POA: Diagnosis not present

## 2023-12-21 MED ORDER — IOHEXOL 350 MG/ML SOLN
75.0000 mL | Freq: Once | INTRAVENOUS | Status: AC | PRN
Start: 2023-12-21 — End: 2023-12-21
  Administered 2023-12-21: 75 mL via INTRAVENOUS

## 2023-12-22 ENCOUNTER — Ambulatory Visit: Admitting: Psychology

## 2023-12-22 DIAGNOSIS — F410 Panic disorder [episodic paroxysmal anxiety] without agoraphobia: Secondary | ICD-10-CM

## 2023-12-22 DIAGNOSIS — F331 Major depressive disorder, recurrent, moderate: Secondary | ICD-10-CM

## 2023-12-22 DIAGNOSIS — F4381 Prolonged grief disorder: Secondary | ICD-10-CM

## 2023-12-22 NOTE — Progress Notes (Signed)
 [facility] Counselor Initial Adult Exam  Name: Eugene Guzman. Date: 12/22/2023 MRN: [id number] DOB: 12/28/1955 PCP: Christel Cousins, MD  Time spent: 60 minutes  Guardian/Payee:  N/A    Paperwork requested: No   Reason for Visit /Presenting Problem: Symptoms of Depression, Grief and Panic   Mental Status Exam: Appearance:   Casual     Behavior:  Appropriate  Motor:  Normal  Speech/Language:   Clear and Coherent  Affect:  Appropriate  Mood:  depressed  Thought process:  normal  Thought content:    WNL  Sensory/Perceptual disturbances:    WNL  Orientation:  oriented to person, place, and situation  Attention:  Good  Concentration:  Good  Memory:  WNL  Fund of knowledge:   Good  Insight:    Good  Judgment:   Good  Impulse Control:  Good    Reported Symptoms:  sadness, powerlessness, anxiety/panic  Risk Assessment: Danger to Self:  No Self-injurious Behavior: No Danger to Others: No Duty to Warn:no Physical Aggression / Violence:No  Access to Firearms a concern: No  Gang Involvement:No  Patient / guardian was educated about steps to take if suicide or homicide risk level increases between visits: n/a While future psychiatric events cannot be accurately predicted, the patient does not currently require acute inpatient psychiatric care and does not currently meet [place]  involuntary commitment criteria.  Substance Abuse History: Current substance abuse: No     Past Psychiatric History:   Previous psychological history is significant for depression and panic Outpatient Providers:Lisa Polis History of Psych Hospitalization: No  Psychological Testing: N/A    Abuse History:  Victim of: No., N/A   Report needed: No. Victim of Neglect:No. Perpetrator of N/A  Witness / Exposure to Domestic Violence: No   Protective Services Involvement: No  Witness to [employer] Violence:  No   Family History:  Family History  Problem Relation Age of Onset   Hypertension Mother    Cancer Mother    Hearing loss Mother    Heart attack Father 46   Arthritis Father    Depression Father    Heart disease Father    Depression Paternal Uncle    Depression Paternal Uncle    Ovarian cancer Maternal Grandmother    Cancer Maternal Grandmother    Hearing loss Maternal Grandfather    Heart disease Maternal Grandfather    Hypertension Maternal Grandfather    Stroke Maternal Grandfather    Colon cancer Paternal Grandfather 63   Cancer Paternal Grandfather    Esophageal cancer Neg Hx    Rectal cancer Neg Hx    Stomach cancer Neg Hx  Living situation: the patient lives with their spouse  Sexual Orientation: Straight  Relationship Status: married  Name of spouse / other:unknown If a parent, number of children / ages:none  Support Systems: N/A  Financial Stress:  No   Income/Employment/Disability: Neurosurgeon: unknown  Educational History: Education: Risk manager: unknown  Any cultural differences that may affect / interfere with treatment:  not applicable   Recreation/Hobbies: unknown  Stressors: Loss of mother    Strengths: Supportive Relationships and Able to Communicate Effectively  Barriers:  unknown   Legal History: Pending legal issue / charges: The patient has no significant history of legal issues. History of legal issue / charges: N/A  Medical History/Surgical History: reviewed Past Medical History:  Diagnosis Date   Allergy    childhood - penicillin, 2014 - latex   Anxiety    Aortic aneurysm (HCC)    Arthritis 2014   right shoulder   CLL (chronic  lymphocytic leukemia) (HCC)    Coronary artery calcification seen on CT scan    Depressed    Enlarged prostate    Hx of adenomatous polyp of colon 02/2018   Dr. Sharol Decamp   Hyperlipidemia    Hypertension    Sleep apnea 2022   Have stopped using CPAP    Past Surgical History:  Procedure Laterality Date   CORONARY PRESSURE/FFR STUDY N/A 04/28/2022   Procedure: INTRAVASCULAR PRESSURE WIRE/FFR STUDY;  Surgeon: Arleen Lacer, MD;  Location: [facility] INVASIVE CV LAB;  Service: Cardiovascular;  Laterality: N/A;   LEFT HEART CATH AND CORONARY ANGIOGRAPHY N/A 04/28/2022   Procedure: LEFT HEART CATH AND CORONARY ANGIOGRAPHY;  Surgeon: Arleen Lacer, MD;  Location: [facility] INVASIVE CV LAB;  Service: Cardiovascular;  Laterality: N/A;   TONSILLECTOMY  1961   TRANSURETHRAL RESECTION OF PROSTATE N/A 03/19/2023   Procedure: TRANSURETHRAL RESECTION OF THE PROSTATE (TURP);  Surgeon: Melody Spurling., MD;  Location: [facility] ORS;  Service: Urology;  Laterality: N/A;    Medications: Current Outpatient Medications  Medication Sig Dispense Refill   amLODipine  (NORVASC ) 5 MG tablet Take 1 tablet (5 mg total) by mouth daily. 90 tablet 3   aspirin  EC 81 MG tablet Take 1 tablet (81 mg total) by mouth daily. Swallow whole. 90 tablet 3   B COMPLEX VITAMINS PO Take 1 capsule by mouth daily. 100 mg daily     bismuth subsalicylate (PEPTO BISMOL) 262 MG/15ML suspension Take 30 mLs by mouth every 6 (six) hours as needed for indigestion or diarrhea or loose stools.     celecoxib (CELEBREX) 200 MG capsule Take 200 mg by mouth daily.     Cholecalciferol (VITAMIN D3) 50 MCG (2000 UT) CAPS Take 1 capsule by mouth daily.     clonazePAM  (KLONOPIN ) 0.5 MG tablet Take 0.5 mg by mouth 4 (four) times daily as needed for anxiety.     ezetimibe  (ZETIA ) 10 MG tablet Take 1 tablet (10 mg total) by mouth daily. 30 tablet 5   fluvoxaMINE  (LUVOX ) 100 MG tablet Take 200 mg by mouth at bedtime.     lamoTRIgine  (LAMICTAL ) 200 MG tablet Take 200  mg by mouth at bedtime.     multivitamin (ONE-A-DAY MEN'S) TABS tablet Take 1 tablet by mouth at bedtime.     PREVIDENT 5000 BOOSTER PLUS 1.1 % PSTE Place 1 Application onto teeth daily. (Patient not taking: Reported on 12/08/2023)     rosuvastatin  (CRESTOR ) 20 MG tablet Take 1 tablet (20 mg total) by mouth  daily. (Patient taking differently: Take 20 mg by mouth at bedtime.) 90 tablet 3   sildenafil  (VIAGRA ) 50 MG tablet Take 1 tablet (50 mg total) by mouth daily as needed for erectile dysfunction (Use 1 hour before sexual activity.). 30 tablet 2   traZODone  (DESYREL ) 50 MG tablet Take 1 tablet (50 mg total) by mouth at bedtime. (Patient not taking: Reported on 12/08/2023) 30 tablet 5   WELLBUTRIN XL 150 MG 24 hr tablet Take 150 mg by mouth daily.     No current facility-administered medications for this visit.    Allergies  Allergen Reactions   Levofloxacin     Avoid fluoroquinolone antibiotics due to thoracic aortic aneurysm   Penicillins Other (See Comments)    Childhood   Quinolones     Patient with ATAA (ascending thoracic aortic aneurysm)   Latex Rash    Diagnoses:  Major Depression, Panic, Grief  Plan of Care: Outpatient Psychotherapy and medication management  Initial Note: He states he has taken psychotropic medication for years and sees Addison Holster at [facility]. States [place] and feels he has suffered depression most of his life. He only took vacation once a year and would get really depressed when it was over. First significant depression was at 33 when he had a break-up. Had more minor depressive episodes earlier in life. Change has always been difficult for him. Also has had severe panic attacks. First one he remembers was first day of his fifth year of college. He was distressed that this was the end of his college career. He recalls having a panic when on business trip to [place] in a [employer]. He had to come home. 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 He was offered early retirement and had a panic when he realized that he just quit a job of 30 years and his life was changing. When he has panic, he doesn't have a fear of death, rather it is a fear of something he can't control. His anxiety was less prevalent in his life than the depression.  He says my major problem in life is depression and panic. His mother passed and he is struggling with her loss. She died 2 weeks ago at 9 and was a vibrant person for 92 years. In 1 year, she had rapid dementia. He says, she was his best friend. He has a small farm down the road from where his mother lived. He says I don't handle death well. His reason to seek therapy was before mother was deteriorating. He had tried therapy in the past and it was not helpful. Also, he has had medical challenges himself, but says it does not cause him distress. He has been on a number of different anti-depressants over the years.  He contacted me for his depression, ut says the grief is now most prevalent.  He has 1 sister in [place]. She is 3 years older and they have a good relationship, but she has kids and grandchildren and they keep her busy. He married at 15 and wife was 39. His wife will occasionally tell him she blames him for not having kids. He told her when they married that he was too old to have kids. He says it was his inability to handle change. This is his only marriage for both he and his wife. Wife is from [place]  and has 3 sisters and one brother. None of  her family in this area. He left [employer] at 52 (too early). 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. Wife retired in 2013.  Will complete intake at next visit. Patient states he wants to continue treatment and prefers face to face when possible.   Goals/Tx. Plan: Patient states that he is seeking symptom relief from life long depressive episodes. First, however, he needs support and strategies to  manage grief over the recent (2 weeks) loss of his mother. Will engage in grief counseling to reduce current despair. In addition, treatment will address his anxiety that contributes to episodic panic attacks. This will involve relaxation techniques and other behavioral strategies  to facilitate greater control over his anxiety. Patient agrees to plan with a goal date of 12-25.   Patient was seen face to face in Provider's office.  Session note: Patient states he has had numerous medical appointments and is doing well. We talked about the primary issues he is currently facing, including his grief, management of estate issues, his drinking and his marital situation. He reports that they have made significant progress in cleaning out his mother's house. Is distressed about how to approach the sale of the house. Is under a time line and still has lots of tools, etc. at house. He says he has been depressed and not doing much. He feels that his outlook on life has soured since the loss of his mother. His lack of motivation is most significant symptom. He reports that his drinking is better in that he has reduced his intake of alcohol. Has not reached his goal, which is NOT total abstinence. Wants to be able to drink with a meal, but not excessive. Told to keep record of alcohol intake and put himself on a timeline.                     Jola Nash, PhD 10:40a-11:30a 50 minutes

## 2023-12-27 ENCOUNTER — Encounter: Payer: Self-pay | Admitting: Family Medicine

## 2023-12-28 ENCOUNTER — Encounter: Payer: Self-pay | Admitting: Physician Assistant

## 2023-12-28 ENCOUNTER — Ambulatory Visit: Attending: Thoracic Surgery (Cardiothoracic Vascular Surgery) | Admitting: Physician Assistant

## 2023-12-28 VITALS — BP 118/63 | HR 64 | Resp 20 | Ht 69.0 in | Wt 178.0 lb

## 2023-12-28 DIAGNOSIS — I7121 Aneurysm of the ascending aorta, without rupture: Secondary | ICD-10-CM

## 2023-12-28 NOTE — Patient Instructions (Signed)
 Risk Modification in those with ascending thoracic aortic aneurysm:  Continue good control of blood pressure (prefer SBP 130/80 or less)-continue Amlodipine  (Norvasc )  2. Avoid fluoroquinolone antibiotics (I.e Ciprofloxacin, Avelox, Levofloxacin, Ofloxacin)  3.  Use of statin (to decrease cardiovascular risk)-continue Rosuvastatin  (Crestor )  4.  Exercise and activity limitations is individualized, but in general, contact sports are to be  avoided and one should avoid heavy lifting (defined as half of ideal body weight) and exercises involving sustained Valsalva maneuver.  5. Counseling for those suspected of having genetically mediated disease. First-degree relatives of those with TAA disease should be screened as well as those who have a connective tissue disease (I.e with Marfan syndrome, Ehlers-Danlos syndrome,  and Loeys-Dietz syndrome) or a  bicuspid aortic valve,have an increased risk for  complications related to TAA. He denies family history of TAA and personal history of connective tissue disorder.  Echocardiogram done in January 2023 showed a bicuspid valve with evidence of mild aortic regurgitation.  6. He has a remote history of tobacco abuse;he quit in 2016

## 2024-01-01 NOTE — Progress Notes (Unsigned)
 Cardiology Office Note    Date:  01/03/2024  ID:  Eugene Misty Elad Macphail., DOB [birth year], MRN [id number] PCP:  Wendolyn Jenkins Jansky, MD  Cardiologist:  Vina Gull, MD  Electrophysiologist:  None   Chief Complaint: Follow up for CAD   History of Present Illness: .    Eugene Guzman. is a 68 y.o. male with visit-pertinent history of moderate nonobstructive CAD by cath in 2023, HTN, HLD, thoracic aneurysm, CLL followed at [facility].  Patient has been followed by Dr. Gull.  Patient had a calcium  CT score in 2021 with score of 2118, 97 percentile with calcifications noted in all coronary arteries.  Following he had a MPI that showed normal perfusion.  CT also showed ascending aorta was dilated at 4.5 cm, he has now followed by Dr. Kerrin with serial CT exams.  Echocardiogram in 07/2021 indicated LVEF 60 to 65%, no RWMA, G1 DD, RV systolic function and size was normal, LA was mildly dilated, aortic valve noted be bicuspid with mild calcification of the aortic valve with mild regurgitation. Patient underwent PET stress in 04/2022, noted to have normal perfusion study, study was low to intermediate risk due to mildly reduced myocardial blood flow reserve.  There is no evidence of ischemia or infarction.  Coronary calcium  was present with severe coronary calcification, calcifications noted in LAD, left circumflex and right coronary artery.  Given results and chest discomfort patient underwent LHC on 04/28/2022 with Dr. Anner, there is no evidence of obstructive disease, moderate multivessel CAD with mid LAD 60% with RFR 0.94, ostial LCx 55% and mid RCA 55%.  Patient was last seen in clinic on 06/15/2023 by Dr. Gull.  Patient had remained stable from a cardiac standpoint.  Today he presents for follow-up with his wife.  He reports that he is doing well overall.  He notes that he has been having some ongoing medical conditions with frequent doctors appointments given CLL.  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  Patient notes that following the passing of his mother last year he has been dealing with cleaning out her house and ongoing paperwork.  Patient denies significant lower extremity edema, orthopnea or PND, palpitations.  On chart review patient did have an episode of presyncope in February, felt to be orthostatic in nature as patient had not eaten or consumed fluids in 24 hours.  He denies any recurrence, notes if he stands too fast getting up from bed he will feel slightly dizzy for a few seconds then resolves.  Patient notes this has improved with increased fluid and oral intake, struggles with this as he notes his CLL causes him to have a decreased appetite.  Patient reports that overall he feels he is remained stable from a cardiac standpoint.  Patient is able to achieve greater than 4 METS of activity.  ROS: .   Today he denies chest pain, lower extremity edema, palpitations, melena, hematuria, hemoptysis, diaphoresis, weakness, presyncope, syncope, orthopnea, and PND.  All other systems are reviewed and otherwise negative. Studies Reviewed: SABRA    EKG:  EKG is not ordered today.  CV Studies: Cardiac studies reviewed are outlined and summarized above. Otherwise please see EMR for full report. Cardiac Studies & Procedures   ______________________________________________________________________________________________ CARDIAC CATHETERIZATION  CARDIAC CATHETERIZATION 04/28/2022  Conclusion POST-OP DIAGNOSIS Correct findings on stress PET: No obstructive disease Moderate multivessel CAD, calcified: Mid LAD  60% (RFR 0.94-not significant, apical LAD RFR was still not significant-0.92.), ostial LCx 55%, mid RCA 55%. Normal LV function, normal LVEDP   RECOMMENDATIONS Continue to titrate medications for cardiac risk factor reduction Consider increasing calcium  channel blocker for  likely microvascular disease based on stress PET results.  Follow-up with Dr. Okey.   Alm Clay, MD  Findings Coronary Findings Diagnostic  Dominance: Right  Left Main  Left Anterior Descending Ost LAD lesion is 20% stenosed. The lesion is concentric. The lesion is calcified. Prox LAD to Mid LAD lesion is 60% stenosed. The lesion is distal to major branch, eccentric and irregular. The lesion is moderately calcified. Pressure wire/FFR was performed on the lesion. Pressure X wire-RFR 0.94;  RFR:  Additional 3500 units IV heparin  administered: ACT 275 seconds 5 French EBU 3.5 guide catheter, pressure X wire -> after initial zeroing and equalization, the wire was advanced across the lesion for initial RFR measurement.  It was then advanced down to the apex for apical evaluation and there was still 0.92.  Not significant. After the guidewire was removed, scout angiography performed showing no perforation.  First Diagonal Branch Vessel is small in size.  Third Diagonal Branch Vessel is small in size.  Left Anterior Descending Ost LAD lesion is 20% stenosed. The lesion is concentric. The lesion is calcified. Prox LAD to Mid LAD lesion is 60% stenosed. The lesion is distal to major branch, eccentric and irregular. The lesion is moderately calcified. Pressure wire/FFR was performed on the lesion. Pressure X wire-RFR 0.94;  RFR:  Additional 3500 units IV heparin  administered: ACT 275 seconds 5 French EBU 3.5 guide catheter, pressure X wire -> after initial zeroing and equalization, the wire was advanced across the lesion for initial RFR measurement.  It was then advanced down to the apex for apical evaluation and there was still 0.92.  Not significant. After the guidewire was removed, scout angiography performed showing no perforation.  First Diagonal Branch Vessel is small in size.  Third Diagonal Branch Vessel is small in size.  Left Circumflex Vessel is moderate in size. Ost Cx lesion  is 55% stenosed. The lesion is focal, discrete and concentric. The lesion is moderately calcified.  First Obtuse Marginal Branch Vessel is small in size.  Right Coronary Artery Vessel was injected. Vessel is large. There is moderate focal disease in the vessel. Prox RCA lesion is 25% stenosed. Mid RCA lesion is 55% stenosed.  Acute Marginal Branch Vessel is small in size.  Right Posterior Descending Artery Vessel is small in size.  Right Posterior Atrioventricular Artery Vessel is large in size.  First Right Posterolateral Branch Vessel is small in size.  Second Right Posterolateral Branch Vessel is large in size.  Intervention  No interventions have been documented.   STRESS TESTS  NM PET CT CARDIAC PERFUSION MULTI W/ABSOLUTE BLOODFLOW 04/21/2022  Narrative   The perfusion study is normal. The study is low-intermediate risk due to mildly reduced myocardial blood flow reserve.   LV perfusion is normal. There is no evidence of ischemia. There is no evidence of infarction.   Rest left ventricular function is normal. Rest EF: 59 %. Stress left ventricular function is normal. Stress EF: 65 %. End diastolic cavity size is normal. End systolic cavity size is normal. No evidence of transient ischemic dilation (TID) noted.   Myocardial blood flow was computed to be 1.54ml/g/min at rest and 1.95ml/g/min at stress. Global myocardial blood flow reserve was 1.88 and was mildly abnormal.   Coronary  calcium  was present on the attenuation correction CT images. Severe coronary calcifications were present. Coronary calcifications were present in the left anterior descending artery, left circumflex artery and right coronary artery distribution(s).   Electronically signed by: Soyla DELENA Merck, MD  CLINICAL DATA:  This over-read does not include interpretation of cardiac or coronary anatomy or pathology. No interpretation the PET data set. The cardiac PET-CT interpretation by the  cardiologist is attached.  COMPARISON:  None Available.  FINDINGS: Limited view of the lung parenchyma demonstrates no suspicious nodularity. Airways are normal.  Limited view of the mediastinum demonstrates no adenopathy. Esophagus normal.  Limited view of the upper abdomen unremarkable.  Limited view of the skeleton and chest wall is unremarkable.  IMPRESSION: No significant extracardiac findings.   Electronically Signed By: Jackquline Boxer M.D. On: 04/21/2022 15:52   ECHOCARDIOGRAM  ECHOCARDIOGRAM COMPLETE 07/16/2021  Narrative ECHOCARDIOGRAM REPORT    Patient Name:   Eugene Guzman Date of Exam: 07/16/2021 Medical Rec #:  [id number]       Height:       70.0 in Accession #:    [id number]      Weight:       166.0 lb Date of Birth:  06/01/1956       BSA:          1.928 m Patient Age:    60 years        BP:           145/79 mmHg Patient Gender: M               HR:           65 bpm. Exam Location:  [address]  Procedure: 2D Echo, 3D Echo, Cardiac Doppler, Color Doppler and Strain Analysis  Indications:    I35.9 AV calcification  History:        Patient has no prior history of Echocardiogram examinations. CAD; Risk Factors:Hypertension, Dyslipidemia and Former Smoker. Ascending aortic aneurysm.  Sonographer:    Marshia Lawyer BS, RDCS Referring Phys: [id number] MYRON G RODDENBERRY  IMPRESSIONS   1. Left ventricular ejection fraction, by estimation, is 60 to 65%. The left ventricle has normal function. The left ventricle has no regional wall motion abnormalities. Left ventricular diastolic parameters are consistent with Grade I diastolic dysfunction (impaired relaxation). 2. Right ventricular systolic function is normal. The right ventricular size is normal. 3. Left atrial size was mildly dilated. 4. The mitral valve is normal in structure. No evidence of mitral valve regurgitation. No evidence of mitral stenosis. 5. The aortic valve is bicuspid. There is mild  calcification of the aortic valve. Aortic valve regurgitation is mild. Aortic valve sclerosis/calcification is present, without any evidence of aortic stenosis. 6. Aortic dilatation noted. There is moderate dilatation of the aortic root and of the ascending aorta, measuring 45 mm. 7. The inferior vena cava is dilated in size with >50% respiratory variability, suggesting right atrial pressure of 8 mmHg.  FINDINGS Left Ventricle: Left ventricular ejection fraction, by estimation, is 60 to 65%. The left ventricle has normal function. The left ventricle has no regional wall motion abnormalities. The left ventricular internal cavity size was normal in size. There is no left ventricular hypertrophy. Left ventricular diastolic parameters are consistent with Grade I diastolic dysfunction (impaired relaxation).  Right Ventricle: The right ventricular size is normal. No increase in right ventricular wall thickness. Right ventricular systolic function is normal.  Left Atrium: Left atrial size was mildly dilated.  Right Atrium:  Right atrial size was normal in size.  Pericardium: There is no evidence of pericardial effusion.  Mitral Valve: The mitral valve is normal in structure. No evidence of mitral valve regurgitation. No evidence of mitral valve stenosis.  Tricuspid Valve: The tricuspid valve is normal in structure. Tricuspid valve regurgitation is trivial. No evidence of tricuspid stenosis.  Aortic Valve: The aortic valve is bicuspid. There is mild calcification of the aortic valve. Aortic valve regurgitation is mild. Aortic regurgitation PHT measures 504 msec. Aortic valve sclerosis/calcification is present, without any evidence of aortic stenosis. Aortic valve mean gradient measures 8.0 mmHg. Aortic valve peak gradient measures 14.4 mmHg. Aortic valve area, by VTI measures 2.05 cm.  Pulmonic Valve: The pulmonic valve was normal in structure. Pulmonic valve regurgitation is trivial. No evidence of  pulmonic stenosis.  Aorta: The aortic root is normal in size and structure and aortic dilatation noted. There is moderate dilatation of the aortic root and of the ascending aorta, measuring 45 mm.  Venous: The inferior vena cava is dilated in size with greater than 50% respiratory variability, suggesting right atrial pressure of 8 mmHg.  IAS/Shunts: No atrial level shunt detected by color flow Doppler.   LEFT VENTRICLE PLAX 2D LVIDd:         5.10 cm   Diastology LVIDs:         3.40 cm   LV e' medial:    7.35 cm/s LV PW:         0.80 cm   LV E/e' medial:  9.9 LV IVS:        1.10 cm   LV e' lateral:   11.00 cm/s LVOT diam:     2.30 cm   LV E/e' lateral: 6.6 LV SV:         90 LV SV Index:   47        2D Longitudinal Strain LVOT Area:     4.15 cm  2D Strain GLS (A2C):   -20.5 % 2D Strain GLS (A3C):   -23.8 % 2D Strain GLS (A4C):   -21.1 % 2D Strain GLS Avg:     -21.8 %  3D Volume EF: 3D EF:        57 % LV EDV:       136 ml LV ESV:       58 ml LV SV:        78 ml  RIGHT VENTRICLE             IVC RV Basal diam:  3.10 cm     IVC diam: 2.10 cm RV S prime:     14.80 cm/s TAPSE (M-mode): 2.7 cm  LEFT ATRIUM             Index        RIGHT ATRIUM           Index LA diam:        3.00 cm 1.56 cm/m   RA Pressure: 8.00 mmHg LA Vol (A2C):   59.5 ml 30.85 ml/m  RA Area:     18.10 cm LA Vol (A4C):   34.3 ml 17.79 ml/m  RA Volume:   48.80 ml  25.31 ml/m LA Biplane Vol: 47.5 ml 24.63 ml/m AORTIC VALVE AV Area (Vmax):    2.02 cm AV Area (Vmean):   2.06 cm AV Area (VTI):     2.05 cm AV Vmax:           190.00 cm/s AV Vmean:  133.000 cm/s AV VTI:            0.439 m AV Peak Grad:      14.4 mmHg AV Mean Grad:      8.0 mmHg LVOT Vmax:         92.40 cm/s LVOT Vmean:        66.100 cm/s LVOT VTI:          0.217 m LVOT/AV VTI ratio: 0.49 AI PHT:            504 msec  AORTA Ao Root diam: 4.60 cm Ao Asc diam:  4.50 cm  MITRAL VALVE               TRICUSPID VALVE Estimated  RAP:  8.00 mmHg MV Decel Time: 243 msec MV E velocity: 73.10 cm/s  SHUNTS MV A velocity: 75.90 cm/s  Systemic VTI:  0.22 m MV E/A ratio:  0.96        Systemic Diam: 2.30 cm  Toribio Fuel MD Electronically signed by Toribio Fuel MD Signature Date/Time: 07/16/2021/5:18:32 PM    Final      CT SCANS  CT CARDIAC SCORING (SELF PAY ONLY) 11/27/2019  Addendum 11/27/2019  6:48 PM ADDENDUM REPORT: 11/27/2019 18:45  CLINICAL DATA:  Risk stratification  EXAM: Coronary Calcium  Score  TECHNIQUE: The patient was scanned on a [employer] scanner. Axial non-contrast 3 mm slices were carried out through the heart. The data set was analyzed on a dedicated work station and scored using the Agatson method.  FINDINGS: Non-cardiac: See separate report from [facility].  Ascending Aorta: Ascending aortic aneurysm measuring 4.5cm in greatest diameter at the level of the bifurcation of the Pulmonary artery. Scattered calcifications in the ascending and descending thoracic aorta.  Pericardium: Normal  Coronary arteries: Normal coronary origins. Calcifications noted in the LAD, LCX and RCA.  IMPRESSION: Coronary calcium  score of 2118. This was 97th percentile for age and sex matched control.  Moderate ascending thoracic aortic aneurysm measuring 4.5cm.  Consider non-invasive ischemic workup.  Consider preventive therapy and risk factor modification.  Wilbert Bihari   Electronically Signed By: Wilbert Bihari On: 11/27/2019 18:45  Narrative EXAM: OVER-READ INTERPRETATION  CT CHEST  The following report is an over-read performed by radiologist Dr. Franky Crease of [facility] on 11/27/2019. This over-read does not include interpretation of cardiac or coronary anatomy or pathology. The coronary calcium  score interpretation by the cardiologist is attached.  COMPARISON:  None.  FINDINGS: Vascular: Heart is normal size. Ascending aortic aneurysm  measures up to 4.5 cm. Atherosclerotic calcifications in the aortic root and descending thoracic aorta.  Mediastinum/Nodes: No adenopathy  Lungs/Pleura: Visualized lungs clear.  No effusions.  Upper Abdomen: Imaging into the upper abdomen shows no acute findings.  Musculoskeletal: Chest wall soft tissues are unremarkable. No acute bony abnormality.  IMPRESSION: 4.5 cm ascending thoracic aortic aneurysm. Recommend semi-annual imaging followup by CTA or MRA and referral to cardiothoracic surgery if not already obtained. This recommendation follows 2010 ACCF/AHA/AATS/ACR/ASA/SCA/SCAI/SIR/STS/SVM Guidelines for the Diagnosis and Management of Patients With Thoracic Aortic Disease. Circulation. 2010; 121: Z733-z630. Aortic aneurysm NOS (ICD10-I71.9)  Electronically Signed: By: Franky Crease M.D. On: 11/27/2019 15:36     ______________________________________________________________________________________________       Current Reported Medications:.    Current Meds  Medication Sig   amLODipine  (NORVASC ) 5 MG tablet Take 1 tablet (5 mg total) by mouth daily.   aspirin  EC 81 MG tablet Take 1 tablet (81 mg total) by mouth daily.  Swallow whole.   B COMPLEX VITAMINS PO Take 1 capsule by mouth daily. 100 mg daily   Cholecalciferol (VITAMIN D3) 50 MCG (2000 UT) CAPS Take 1 capsule by mouth daily.   clonazePAM  (KLONOPIN ) 0.5 MG tablet Take 0.5 mg by mouth 4 (four) times daily as needed for anxiety.   ezetimibe  (ZETIA ) 10 MG tablet Take 1 tablet (10 mg total) by mouth daily.   fluvoxaMINE  (LUVOX ) 100 MG tablet Take 200 mg by mouth at bedtime.   lamoTRIgine  (LAMICTAL ) 200 MG tablet Take 200 mg by mouth at bedtime.   multivitamin (ONE-A-DAY MEN'S) TABS tablet Take 1 tablet by mouth at bedtime.   PREVIDENT 5000 BOOSTER PLUS 1.1 % PSTE Place 1 Application onto teeth daily.   rosuvastatin  (CRESTOR ) 20 MG tablet Take 1 tablet (20 mg total) by mouth daily.   sildenafil  (VIAGRA ) 50 MG tablet  Take 1 tablet (50 mg total) by mouth daily as needed for erectile dysfunction (Use 1 hour before sexual activity.).   WELLBUTRIN XL 150 MG 24 hr tablet Take 150 mg by mouth daily.    Physical Exam:    VS:  BP 128/74   Pulse 68   Ht 5' 9 (1.753 m)   Wt 170 lb 9.6 oz (77.4 kg)   SpO2 96%   BMI 25.19 kg/m    Wt Readings from Last 3 Encounters:  01/03/24 170 lb 9.6 oz (77.4 kg)  12/28/23 178 lb (80.7 kg)  12/08/23 174 lb 6 oz (79.1 kg)    GEN: Well nourished, well developed in no acute distress NECK: No JVD; No carotid bruits CARDIAC: RRR, no murmurs, rubs, gallops RESPIRATORY:  Clear to auscultation without rales, wheezing or rhonchi  ABDOMEN: Soft, non-tender, non-distended EXTREMITIES:  No edema; No acute deformity     Asessement and Plan:.    CAD: LHC on 04/28/2022 with Dr. Anner, there is no evidence of obstructive disease, moderate multivessel CAD with mid LAD 60% with RFR 0.94, ostial LCx 55% and mid RCA 55%. Stable with no anginal symptoms. No indication for ischemic evaluation. Heart healthy diet and regular cardiovascular exercise encouraged.  Continue amlodipine  5 mg daily, aspirin  81 mg daily, Zetia  10 mg daily, Crestor  20 mg daily.  Bicuspid aortic valve: Noted on echocardiogram in 07/2021.  Patient reports stable dyspnea when climbing stairs that is unchanged.  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  Given ascending aortic aneurysm and history of bicuspid valve will evaluate with echocardiogram.  HTN: Blood pressure today 120/74.  Continue amlodipine  5 mg daily.  HLD: Last lipid profile on 01/20/2023 indicated total cholesterol 157, HDL 110, triglycerides 47 and LDL 37.  Continue Zetia  10 mg daily and Crestor  20 mg daily.  Thoracic aneurysm: CTA on 12/28/2023 showed a 4.4 cm ascending aortic aneurysm with a dilated aortic root measuring up to 4.4 to 4.7 cm at the level of the sinuses of Valsalva with associated heavy calcified and  thickened aortic valve. Followed by Dr. Kerrin.   CLL: Follows at [facility].  Preoperative cardiac evaluation: Patient reports that he is planning to undergo endoscopy and colonoscopy next month, we have not yet received a request for cardiac clearance.  Patient is overall doing well, he has remained stable from a cardiac standpoint.  Patient's RCRI is 0.4%.  He is able to achieve greater than 4 METS of activity.  Patient can proceed with endoscopy/colonoscopy at appropriate cardiac risk.   Disposition: F/u with Dr. Okey in 6  months per patient request.   Signed, Anays Detore D Janaa Acero, NP

## 2024-01-03 ENCOUNTER — Ambulatory Visit: Attending: Cardiology | Admitting: Cardiology

## 2024-01-03 ENCOUNTER — Encounter: Payer: Self-pay | Admitting: Cardiology

## 2024-01-03 VITALS — BP 128/74 | HR 68 | Ht 69.0 in | Wt 170.6 lb

## 2024-01-03 DIAGNOSIS — R310 Gross hematuria: Secondary | ICD-10-CM | POA: Diagnosis not present

## 2024-01-03 DIAGNOSIS — C9110 Chronic lymphocytic leukemia of B-cell type not having achieved remission: Secondary | ICD-10-CM

## 2024-01-03 DIAGNOSIS — N281 Cyst of kidney, acquired: Secondary | ICD-10-CM | POA: Diagnosis not present

## 2024-01-03 DIAGNOSIS — I7121 Aneurysm of the ascending aorta, without rupture: Secondary | ICD-10-CM

## 2024-01-03 DIAGNOSIS — I2510 Atherosclerotic heart disease of native coronary artery without angina pectoris: Secondary | ICD-10-CM | POA: Diagnosis not present

## 2024-01-03 DIAGNOSIS — R0602 Shortness of breath: Secondary | ICD-10-CM

## 2024-01-03 DIAGNOSIS — E785 Hyperlipidemia, unspecified: Secondary | ICD-10-CM

## 2024-01-03 DIAGNOSIS — Z01810 Encounter for preprocedural cardiovascular examination: Secondary | ICD-10-CM | POA: Diagnosis not present

## 2024-01-03 DIAGNOSIS — I10 Essential (primary) hypertension: Secondary | ICD-10-CM | POA: Diagnosis not present

## 2024-01-03 DIAGNOSIS — N393 Stress incontinence (female) (male): Secondary | ICD-10-CM | POA: Diagnosis not present

## 2024-01-03 NOTE — Patient Instructions (Addendum)
 Medication Instructions:   Your physician recommends that you continue on your current medications as directed. Please refer to the Current Medication list given to you today.  *If you need a refill on your cardiac medications before your next appointment, please call your pharmacy*    Testing/Procedures:  Your physician has requested that you have an echocardiogram. Echocardiography is a painless test that uses sound waves to create images of your heart. It provides your doctor with information about the size and shape of your heart and how well your heart's chambers and valves are working. This procedure takes approximately one hour. There are no restrictions for this procedure. Please do NOT wear cologne, perfume, aftershave, or lotions (deodorant is allowed). Please arrive 15 minutes prior to your appointment time.  Please note: We ask at that you not bring children with you during ultrasound (echo/ vascular) testing. Due to room size and safety concerns, children are not allowed in the ultrasound rooms during exams. Our front office staff cannot provide observation of children in our lobby area while testing is being conducted. An adult accompanying a patient to their appointment will only be allowed in the ultrasound room at the discretion of the ultrasound technician under special circumstances. We apologize for any inconvenience.   Follow-Up:  AS PLANNED WITH DR. ROSS ON 06/20/24 AT 9 AM

## 2024-01-04 ENCOUNTER — Ambulatory Visit (INDEPENDENT_AMBULATORY_CARE_PROVIDER_SITE_OTHER): Admitting: Psychology

## 2024-01-04 ENCOUNTER — Telehealth: Payer: Self-pay

## 2024-01-04 DIAGNOSIS — F331 Major depressive disorder, recurrent, moderate: Secondary | ICD-10-CM | POA: Diagnosis not present

## 2024-01-04 DIAGNOSIS — K760 Fatty (change of) liver, not elsewhere classified: Secondary | ICD-10-CM

## 2024-01-04 DIAGNOSIS — R7401 Elevation of levels of liver transaminase levels: Secondary | ICD-10-CM

## 2024-01-04 NOTE — Telephone Encounter (Signed)
-----   Message from Elspeth SHAUNNA Naval sent at 01/03/2024  5:57 PM EDT ----- Regarding: RE: Labs Yes he should have them, they have been uptrending. Thanks ----- Message ----- From: Edmundo Clarita HERO, CMA Sent: 01/03/2024  10:48 AM EDT To: Elspeth SHAUNNA Naval, MD Subject: FW: Labs                                       This patient recently saw Ellouise. Scheduled to have EGD/Colon with you in July and F/U appointment with Ellouise in August. Please review and advise if you still want him to have LFTs at this time. Thank you ----- Message ----- From: Vicci Aquas, CMA Sent: 01/03/2024  12:00 AM EDT To: Clarita HERO Edmundo, CMA Subject: Labs                                           Pt will be due for LFTs in July 2025

## 2024-01-04 NOTE — Telephone Encounter (Signed)
 Called and Left detailed message for patient that he needs to go to the lab for LFTs at his next convenience.  Lab hours given and number to call back with questions. Order is in

## 2024-01-04 NOTE — Telephone Encounter (Signed)
MyChart message to patient to go to the lab for LFTs.

## 2024-01-04 NOTE — Progress Notes (Signed)
 [facility] Counselor Initial Adult Exam  Name: Eugene Guzman. Date: 01/04/2024 MRN: [id number] DOB: [birth year] PCP: Wendolyn Jenkins Jansky, MD  Time spent: 60 minutes  Guardian/Payee:  N/A    Paperwork requested: No   Reason for Visit /Presenting Problem: Symptoms of Depression, Grief and Panic   Mental Status Exam: Appearance:   Casual     Behavior:  Appropriate  Motor:  Normal  Speech/Language:   Clear and Coherent  Affect:  Appropriate  Mood:  depressed  Thought process:  normal  Thought content:    WNL  Sensory/Perceptual disturbances:    WNL  Orientation:  oriented to person, place, and situation  Attention:  Good  Concentration:  Good  Memory:  WNL  Fund of knowledge:   Good  Insight:    Good  Judgment:   Good  Impulse Control:  Good    Reported Symptoms:  sadness, powerlessness, anxiety/panic  Risk Assessment: Danger to Self:  No Self-injurious Behavior: No Danger to Others: No Duty to Warn:no Physical Aggression / Violence:No  Access to Firearms a concern: No  Gang Involvement:No  Patient / guardian was educated about steps to take if suicide or homicide risk level increases between visits: n/a While future psychiatric events cannot be accurately predicted, the patient does not currently require acute inpatient psychiatric care and does not currently meet [place]  involuntary commitment criteria.  Substance Abuse History: Current substance abuse: No     Past Psychiatric History:   Previous psychological history is significant for depression and panic Outpatient Providers:Lisa Polis History of Psych Hospitalization: No   Psychological Testing: N/A   Abuse History:  Victim of: No., N/A   Report needed: No. Victim of Neglect:No. Perpetrator of N/A  Witness / Exposure to Domestic Violence: No   Protective Services Involvement: No  Witness to [employer] Violence:  No   Family History:  Family History  Problem Relation Age of Onset   Hypertension Mother    Cancer Mother    Hearing loss Mother    Heart attack Father 14   Arthritis Father    Depression Father    Heart disease Father    Depression Paternal Uncle    Depression Paternal Uncle    Ovarian cancer Maternal Grandmother    Cancer Maternal Grandmother    Hearing loss Maternal Grandfather    Heart disease Maternal Grandfather    Hypertension Maternal Grandfather    Stroke Maternal Grandfather    Colon cancer Paternal Grandfather 35   Cancer Paternal Grandfather    Esophageal cancer Neg Hx  Rectal cancer Neg Hx    Stomach cancer Neg Hx     Living situation: the patient lives with their spouse  Sexual Orientation: Straight  Relationship Status: married  Name of spouse / other:unknown If a parent, number of children / ages:none  Support Systems: N/A  Financial Stress:  No   Income/Employment/Disability: Neurosurgeon: unknown  Educational History: Education: Risk manager: unknown  Any cultural differences that may affect / interfere with treatment:  not applicable   Recreation/Hobbies: unknown  Stressors: Loss of mother    Strengths: Supportive Relationships and Able to Communicate Effectively  Barriers:  unknown   Legal History: Pending legal issue / charges: The patient has no significant history of legal issues. History of legal issue / charges: N/A  Medical History/Surgical History: reviewed Past Medical History:  Diagnosis Date   Allergy    childhood - penicillin, 2014 - latex   Anxiety    Aortic aneurysm (HCC)    Arthritis 2014    right shoulder   CLL (chronic lymphocytic leukemia) (HCC)    Coronary artery calcification seen on CT scan    Depressed    Enlarged prostate    Hx of adenomatous polyp of colon 02/2018   Dr. Milon   Hyperlipidemia    Hypertension    Sleep apnea 2022   Have stopped using CPAP    Past Surgical History:  Procedure Laterality Date   CORONARY PRESSURE/FFR STUDY N/A 04/28/2022   Procedure: INTRAVASCULAR PRESSURE WIRE/FFR STUDY;  Surgeon: Anner Alm ORN, MD;  Location: [facility] INVASIVE CV LAB;  Service: Cardiovascular;  Laterality: N/A;   LEFT HEART CATH AND CORONARY ANGIOGRAPHY N/A 04/28/2022   Procedure: LEFT HEART CATH AND CORONARY ANGIOGRAPHY;  Surgeon: Anner Alm ORN, MD;  Location: [facility] INVASIVE CV LAB;  Service: Cardiovascular;  Laterality: N/A;   TONSILLECTOMY  1961   TRANSURETHRAL RESECTION OF PROSTATE N/A 03/19/2023   Procedure: TRANSURETHRAL RESECTION OF THE PROSTATE (TURP);  Surgeon: Alvaro Ricardo KATHEE Mickey., MD;  Location: [facility] ORS;  Service: Urology;  Laterality: N/A;    Medications: Current Outpatient Medications  Medication Sig Dispense Refill   amLODipine  (NORVASC ) 5 MG tablet Take 1 tablet (5 mg total) by mouth daily. 90 tablet 3   aspirin  EC 81 MG tablet Take 1 tablet (81 mg total) by mouth daily. Swallow whole. 90 tablet 3   B COMPLEX VITAMINS PO Take 1 capsule by mouth daily. 100 mg daily     celecoxib (CELEBREX) 200 MG capsule Take 200 mg by mouth daily. (Patient not taking: Reported on 01/03/2024)     Cholecalciferol (VITAMIN D3) 50 MCG (2000 UT) CAPS Take 1 capsule by mouth daily.     clonazePAM  (KLONOPIN ) 0.5 MG tablet Take 0.5 mg by mouth 4 (four) times daily as needed for anxiety.     ezetimibe  (ZETIA ) 10 MG tablet Take 1 tablet (10 mg total) by mouth daily. 30 tablet 5   fluvoxaMINE  (LUVOX ) 100 MG tablet Take 200 mg by mouth at bedtime.     lamoTRIgine  (LAMICTAL ) 200 MG tablet Take 200 mg by mouth at bedtime.     multivitamin (ONE-A-DAY MEN'S) TABS tablet Take 1 tablet  by mouth at bedtime.     PREVIDENT 5000 BOOSTER PLUS 1.1 % PSTE Place 1 Application onto teeth daily.     rosuvastatin  (CRESTOR ) 20 MG tablet Take 1 tablet (20 mg total) by mouth daily. 90 tablet 3   sildenafil  (VIAGRA ) 50 MG tablet Take 1 tablet (  50 mg total) by mouth daily as needed for erectile dysfunction (Use 1 hour before sexual activity.). 30 tablet 2   traZODone  (DESYREL ) 50 MG tablet Take 1 tablet (50 mg total) by mouth at bedtime. (Patient not taking: Reported on 01/03/2024) 30 tablet 5   UNABLE TO FIND Med Name: SODIUM FLORIDE  HASN'T STARTED (Patient not taking: Reported on 01/03/2024)     WELLBUTRIN XL 150 MG 24 hr tablet Take 150 mg by mouth daily.     No current facility-administered medications for this visit.    Allergies  Allergen Reactions   Levofloxacin     Avoid fluoroquinolone antibiotics due to thoracic aortic aneurysm   Penicillins Other (See Comments)    Childhood   Quinolones     Patient with ATAA (ascending thoracic aortic aneurysm)   Latex Rash    Diagnoses:  Major Depression, Panic, Grief  Plan of Care: Outpatient Psychotherapy and medication management  Initial Note: He states he has taken psychotropic medication for years and sees Olam Holly at [facility]. States [place] and feels he has suffered depression most of his life. He only took vacation once a year and would get really depressed when it was over. First significant depression was at 33 when he had a break-up. Had more minor depressive episodes earlier in life. Change has always been difficult for him. Also has had severe panic attacks. First one he remembers was first day of his fifth year of college. He was distressed that this was the end of his college career. He recalls having a panic when on business trip to [place] in a [employer]. He had to come home. 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 He was offered early retirement and had  a panic when he realized that he just quit a job of 30 years and his life was changing. When he has panic, he doesn't have a fear of death, rather it is a fear of something he can't control. His anxiety was less prevalent in his life than the depression.  He says my major problem in life is depression and panic. His mother passed and he is struggling with her loss. She died 2 weeks ago at 2 and was a vibrant person for 92 years. In 1 year, she had rapid dementia. He says, she was his best friend. He has a small farm down the road from where his mother lived. He says I don't handle death well. His reason to seek therapy was before mother was deteriorating. He had tried therapy in the past and it was not helpful. Also, he has had medical challenges himself, but says it does not cause him distress. He has been on a number of different anti-depressants over the years.  He contacted me for his depression, ut says the grief is now most prevalent.  He has 1 sister in [place]. She is 3 years older and they have a good relationship, but she has kids and grandchildren and they keep her busy. He married at 29 and wife was 4. His wife will occasionally tell him she blames him for not having kids. He told her when they married that he was too old to have kids. He says it was his inability to handle change. This is his only marriage for both he and his wife. Wife is from [place]  and has 3 sisters and one brother. None of her family in this  area. He left [employer] at 52 (too early). 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. Wife retired in 2013.  Will complete intake at next visit. Patient states he wants to continue treatment and prefers face to face when possible.   Goals/Tx. Plan: Patient states that he is seeking symptom relief from life long depressive episodes. First, however, he needs support and strategies to manage grief over the recent (2 weeks) loss of his mother. Will engage in grief  counseling to reduce current despair. In addition, treatment will address his anxiety that contributes to episodic panic attacks. This will involve relaxation techniques and other behavioral strategies  to facilitate greater control over his anxiety. Patient agrees to plan with a goal date of 12-25.   Patient was seen face to face in Provider's office.  Session note: Patient says there has been a setback on the house that is creating family turmoil. This is stressing Will to a significant degree. Niece declined his offer and Will, at family request, found a realtor to put the house on the market. Now the niece is interested again. His drinking has not changed much since last appointment. Less than before, but still uses it medicinally. Fears that without alcohol, he is more likely to have a panic. Talked about taking reasonable risks and trusting his instincts and motivations.                         CONI ALM KERNS, PhD 9:40a-10:30a 50 minutes

## 2024-01-06 ENCOUNTER — Other Ambulatory Visit (INDEPENDENT_AMBULATORY_CARE_PROVIDER_SITE_OTHER)

## 2024-01-06 DIAGNOSIS — R7401 Elevation of levels of liver transaminase levels: Secondary | ICD-10-CM

## 2024-01-06 DIAGNOSIS — K760 Fatty (change of) liver, not elsewhere classified: Secondary | ICD-10-CM | POA: Diagnosis not present

## 2024-01-06 LAB — HEPATIC FUNCTION PANEL
ALT: 83 U/L — ABNORMAL HIGH (ref 0–53)
AST: 83 U/L — ABNORMAL HIGH (ref 0–37)
Albumin: 4.7 g/dL (ref 3.5–5.2)
Alkaline Phosphatase: 92 U/L (ref 39–117)
Bilirubin, Direct: 0.3 mg/dL (ref 0.0–0.3)
Total Bilirubin: 0.6 mg/dL (ref 0.2–1.2)
Total Protein: 7.2 g/dL (ref 6.0–8.3)

## 2024-01-09 ENCOUNTER — Ambulatory Visit: Payer: Self-pay | Admitting: Gastroenterology

## 2024-01-11 ENCOUNTER — Encounter: Payer: Self-pay | Admitting: Family Medicine

## 2024-01-12 ENCOUNTER — Encounter: Payer: Self-pay | Admitting: Gastroenterology

## 2024-01-16 ENCOUNTER — Encounter: Payer: Self-pay | Admitting: Gastroenterology

## 2024-01-17 ENCOUNTER — Encounter: Payer: Self-pay | Admitting: Gastroenterology

## 2024-01-17 NOTE — Telephone Encounter (Signed)
 Inbound call from patient states he has been taking baby Asprin yesterday. Requesting f/u call from nurse. Please advise. Patient  is scheduled  for procedure 7/16.

## 2024-01-19 ENCOUNTER — Encounter: Payer: Self-pay | Admitting: Gastroenterology

## 2024-01-19 ENCOUNTER — Ambulatory Visit: Admitting: Gastroenterology

## 2024-01-19 VITALS — BP 119/71 | HR 61 | Temp 97.3°F | Resp 13 | Ht 69.0 in | Wt 174.0 lb

## 2024-01-19 DIAGNOSIS — Z1211 Encounter for screening for malignant neoplasm of colon: Secondary | ICD-10-CM | POA: Diagnosis not present

## 2024-01-19 DIAGNOSIS — K5730 Diverticulosis of large intestine without perforation or abscess without bleeding: Secondary | ICD-10-CM | POA: Diagnosis not present

## 2024-01-19 DIAGNOSIS — K635 Polyp of colon: Secondary | ICD-10-CM

## 2024-01-19 DIAGNOSIS — K2100 Gastro-esophageal reflux disease with esophagitis, without bleeding: Secondary | ICD-10-CM | POA: Diagnosis not present

## 2024-01-19 DIAGNOSIS — Z860100 Personal history of colon polyps, unspecified: Secondary | ICD-10-CM

## 2024-01-19 DIAGNOSIS — D123 Benign neoplasm of transverse colon: Secondary | ICD-10-CM

## 2024-01-19 DIAGNOSIS — K3189 Other diseases of stomach and duodenum: Secondary | ICD-10-CM

## 2024-01-19 DIAGNOSIS — K562 Volvulus: Secondary | ICD-10-CM

## 2024-01-19 DIAGNOSIS — R1013 Epigastric pain: Secondary | ICD-10-CM

## 2024-01-19 DIAGNOSIS — K219 Gastro-esophageal reflux disease without esophagitis: Secondary | ICD-10-CM

## 2024-01-19 DIAGNOSIS — K648 Other hemorrhoids: Secondary | ICD-10-CM | POA: Diagnosis not present

## 2024-01-19 MED ORDER — OMEPRAZOLE 20 MG PO CPDR: 20.0000 mg | DELAYED_RELEASE_CAPSULE | Freq: Every day | ORAL | 2 refills | Status: DC

## 2024-01-19 MED ORDER — SODIUM CHLORIDE 0.9 % IV SOLN: 500.0000 mL | Freq: Once | INTRAVENOUS | Status: DC

## 2024-01-19 NOTE — Op Note (Signed)
  [facility] Patient Name: Eugene Guzman Procedure Date: 01/19/2024 2:04 PM MRN: [id number] Endoscopist: Elspeth P. Leigh , MD, [id number] Age: 68 Referring MD:  Date of Birth: 1956-05-21 Gender: Male Account #: [id number] Procedure:                Colonoscopy Indications:              High risk colon cancer surveillance: Personal                            history of colonic polyps - large / advanced                            sessile serrated lesion removed in piecemeal 05/2023 Medicines:                Monitored Anesthesia Care Procedure:                Pre-Anesthesia Assessment:                           - Prior to the procedure, a History and Physical                            was performed, and patient medications and                            allergies were reviewed. The patient's tolerance of                            previous anesthesia was also reviewed. The risks                            and benefits of the procedure and the sedation                            options and risks were discussed with the patient.                            All questions were answered, and informed consent                            was obtained. Prior Anticoagulants: The patient has                            taken no anticoagulant or antiplatelet agents. ASA                            Grade Assessment: III - A patient with severe                            systemic disease. After reviewing the risks and                            benefits, the patient was deemed in satisfactory  condition to undergo the procedure.                           After obtaining informed consent, the colonoscope                            was passed under direct vision. Throughout the                            procedure, the patient's blood pressure, pulse, and                            oxygen  saturations were monitored continuously. The                            CF  [id number] #[id number] was introduced through the anus                            and advanced to the the cecum, identified by                            appendiceal orifice and ileocecal valve. The                            colonoscopy was performed without difficulty. The                            patient tolerated the procedure well. The quality                            of the bowel preparation was good. The ileocecal                            valve, appendiceal orifice, and rectum were                            photographed. Scope In: 2:17:53 PM Scope Out: 2:36:34 PM Scope Withdrawal Time: 0 hours 6 minutes 14 seconds  Total Procedure Duration: 0 hours 18 minutes 41 seconds  Findings:                 The perianal and digital rectal examinations were                            normal.                           Scattered small-mouthed diverticula were found in                            the entire colon.                           A large post polypectomy scar was found at the  hepatic flexure. There was a 3mm area residual flat                            polyp tissue. It was removed via cold snare. The                            rest of the scar looked good.                           A 3 mm polyp was found in the transverse colon. The                            polyp was flat. The polyp was removed with a cold                            snare. Resection and retrieval were complete.                           The colon revealed excessive looping in the right                            colon.                           Internal hemorrhoids were found during retroflexion.                           The exam was otherwise without abnormality. Complications:            No immediate complications. Estimated blood loss:                            Minimal. Estimated Blood Loss:     Estimated blood loss was minimal. Impression:               - Diverticulosis in the entire  examined colon.                           - Post-polypectomy scar at the hepatic flexure -                            small area of residual flat polypoid tissue,                            removed via cold snare.                           - One 3 mm polyp in the transverse colon, removed                            with a cold snare. Resected and retrieved.                           - There was significant looping of the right colon.                           -  Internal hemorrhoids.                           - The examination was otherwise normal. Recommendation:           - Patient has a contact number available for                            emergencies. The signs and symptoms of potential                            delayed complications were discussed with the                            patient. Return to normal activities tomorrow.                            Written discharge instructions were provided to the                            patient.                           - Resume previous diet.                           - Continue present medications.                           - Await pathology results.                           - Repeat colonoscopy in 3 years for further                            surveillance. Elspeth P. Jarmel Linhardt, MD 01/19/2024 2:42:51 PM This report has been signed electronically.

## 2024-01-19 NOTE — Progress Notes (Unsigned)
 [facility] [address] [place]. [place] [address] [phone]       OFFICE FOLLOW UP NOTE  [name]. Date of Birth:  1955-12-29 Medical Record Number:  [id number]    Primary neurologist: Dr. Chalice. Reason for visit: OSA, hypersomnia    SUBJECTIVE:   CHIEF COMPLAINT:  Chief Complaint  Patient presents with   Follow-up    RM 3, Pt alone. Pt states he has had a bad last 18 months. Mother passed away 1 year ago this month. Pt states has been dx with AA and chronic lymphocytic leukemia. Pt has not been using CPAP. Pt fell Sunday and has abrasions to face from fall. Pt states knee buckled when going up steps at home.    Follow-up visit:  Prior visit: 07/13/2023 with Dr. Chalice  Brief HPI:   Eugene Guzman. Is a 68 y.o. male who is followed for OSA on CPAP. Completed HST 09/2021 which showed severe OSA with calculated AHI 47.9/h as well as severe hypoxia.  Recommended titration study to ensure adequate control of hypoxia but patient wished to proceed with AutoPap initially.  AutoPap initiated 09/2021.  Due to continued difficulties tolerating CPAP and feeling more fatigued with use recommended in-lab titration study but patient declined due to panic attacks and claustrophobia therefore proceeded with repeat HST to verify baseline and ensure hypoxia is true.  Repeat HST 02/2022 still showed presence of severe sleep apnea with total AHI of 40.3/h with supine AHI 50.8/h and O2 nadir of 78% without prolonged hypoxia but unfortunately he remained intolerant to CPAP.  He was referred to dentistry for oral appliance and consider of inspire device and discussed avoidance of supine sleep.  Also completed further lab work for persistent fatigue including HLA narcolepsy panel, ANA and CK which was all largely benign.  At prior visit with Dr. Chalice, noted a fall in 01/2023 resulting in SDH with increased fatigue.  Also noted increased depression  with health-related concerns (dx'd with CLL at 2024) and increased consumption of alcohol.  Discussed avoidance of EtOH as this can worsen underlying severe OSA and encouraged resuming CPAP therapy as now it is not felt he would be a good candidate for dental device or inspire device.  Also discussed chronic tremor over the past 3 years suspected essential and perhaps medication induced.  He was started on trazodone  to help with insomnia but also encouraged further discussion with psychiatry prior to starting.     Interval history:  Patient returns for follow-up visit unaccompanied.  He continues to deal with significant stressors relating to other medical conditions and the loss of his mother last year.  He never retried CPAP therapy as previously advised but after further discussion today, he is willing to retry.  Has not used since 2023 due to claustrophobia sensation and no significant benefit.  He did not start trazodone  as prescription was not filled but is wanting to try.  He is currently using clonazepam  at night which only last for a couple of hours and will have difficulty falling back asleep.  Continues to follow with behavioral health for depression/anxiety management.  Continued EtOH use but has been trying to decrease amount, currently drinking 2-3 beers per day, previously 4 to 6/day.  Reports continued tremors but denies any significant worsening.  Does report a fall over the weekend where his knee gave out on him, he fell into a bush and has facial abrasions as well as abrasion over right elbow.  No recurrent falls since that time.  Ambulates without AD.  He continues to be followed by GI, cardiology and oncology routinely.      ROS:   14 system review of systems performed and negative with exception of those listed in HPI  PMH:  Past Medical History:  Diagnosis Date   Allergy    childhood - penicillin, 2014 - latex   Anxiety    Aortic aneurysm (HCC)    Arthritis 2014   right  shoulder   CLL (chronic lymphocytic leukemia) (HCC)    Coronary artery calcification seen on CT scan    Depressed    Enlarged prostate    Hx of adenomatous polyp of colon 02/2018   Dr. Milon   Hyperlipidemia    Hypertension    Sleep apnea 2022   Have stopped using CPAP    PSH:  Past Surgical History:  Procedure Laterality Date   CORONARY PRESSURE/FFR STUDY N/A 04/28/2022   Procedure: INTRAVASCULAR PRESSURE WIRE/FFR STUDY;  Surgeon: Anner Alm ORN, MD;  Location: [facility] INVASIVE CV LAB;  Service: Cardiovascular;  Laterality: N/A;   LEFT HEART CATH AND CORONARY ANGIOGRAPHY N/A 04/28/2022   Procedure: LEFT HEART CATH AND CORONARY ANGIOGRAPHY;  Surgeon: Anner Alm ORN, MD;  Location: [facility] INVASIVE CV LAB;  Service: Cardiovascular;  Laterality: N/A;   TONSILLECTOMY  1961   TRANSURETHRAL RESECTION OF PROSTATE N/A 03/19/2023   Procedure: TRANSURETHRAL RESECTION OF THE PROSTATE (TURP);  Surgeon: Alvaro Ricardo KATHEE Guzman., MD;  Location: [facility] ORS;  Service: Urology;  Laterality: N/A;    Social History:  Social History   Socioeconomic History   Marital status: Married    Spouse name: Rollene   Number of children: 0   Years of education: Not on file   Highest education level: Bachelor's degree (e.g., BA, AB, BS)  Occupational History   Occupation: retired  Tobacco Use   Smoking status: Former    Current packs/day: 0.00    Average packs/day: 0.3 packs/day for 30.0 years (7.5 ttl pk-yrs)    Types: Cigarettes    Quit date: 01/04/2015    Years since quitting: 9.0    Passive exposure: Never   Smokeless tobacco: Never  Vaping Use   Vaping status: Never Used  Substance and Sexual Activity   Alcohol use: Yes    Alcohol/week: 12.0 standard drinks of alcohol    Types: 12 Cans of beer per week    Comment: socially   Drug use: No   Sexual activity: Yes    Birth control/protection: None  Other Topics Concern   Not on file  Social History Narrative   Lives with wife   Right handed    Caffeine: ice tea daily   Social Drivers of Health   Financial Resource Strain: Low Risk  (09/30/2023)   Overall Financial Resource Strain (CARDIA)    Difficulty of Paying Living Expenses: Not hard at all  Food Insecurity: No Food Insecurity (09/30/2023)   Hunger Vital Sign    Worried About Running Out of Food in the Last Year: Never true    Ran Out of Food in the Last Year: Never true  Transportation Needs: No Transportation Needs (09/30/2023)   PRAPARE - Administrator, Civil Service (Medical): No    Lack of Transportation (Non-Medical): No  Physical Activity: Sufficiently Active (09/30/2023)   Exercise Vital Sign    Days of Exercise per Week: 3 days    Minutes of Exercise per Session: 60 min  Stress: Stress Concern  Present (09/30/2023)   [employer] of Occupational Health - Occupational Stress Questionnaire    Feeling of Stress : Very much  Social Connections: Moderately Integrated (09/30/2023)   Social Connection and Isolation Panel    Frequency of Communication with Friends and Family: More than three times a week    Frequency of Social Gatherings with Friends and Family: More than three times a week    Attends Religious Services: 1 to 4 times per year    Active Member of [employer] or Organizations: No    Attends Banker Meetings: Patient declined    Marital Status: Married  Catering manager Violence: Not At Risk (09/30/2023)   Humiliation, Afraid, Rape, and Kick questionnaire    Fear of Current or Ex-Partner: No    Emotionally Abused: No    Physically Abused: No    Sexually Abused: No    Family History:  Family History  Problem Relation Age of Onset   Hypertension Mother    Cancer Mother    Hearing loss Mother    Heart attack Father 43   Arthritis Father    Depression Father    Heart disease Father    Depression Paternal Uncle    Depression Paternal Uncle    Ovarian cancer Maternal Grandmother    Cancer Maternal Grandmother    Hearing loss  Maternal Grandfather    Heart disease Maternal Grandfather    Hypertension Maternal Grandfather    Stroke Maternal Grandfather    Colon cancer Paternal Grandfather 77   Cancer Paternal Grandfather    Esophageal cancer Neg Hx    Rectal cancer Neg Hx    Stomach cancer Neg Hx     Medications:   Current Outpatient Medications on File Prior to Visit  Medication Sig Dispense Refill   amLODipine  (NORVASC ) 5 MG tablet Take 1 tablet (5 mg total) by mouth daily. 90 tablet 3   aspirin  EC 81 MG tablet Take 1 tablet (81 mg total) by mouth daily. Swallow whole. 90 tablet 3   B COMPLEX VITAMINS PO Take 1 capsule by mouth daily. 100 mg daily     Cholecalciferol (VITAMIN D3) 50 MCG (2000 UT) CAPS Take 1 capsule by mouth daily.     clonazePAM  (KLONOPIN ) 0.5 MG tablet Take 0.5 mg by mouth 4 (four) times daily as needed for anxiety.     ezetimibe  (ZETIA ) 10 MG tablet Take 1 tablet (10 mg total) by mouth daily. 30 tablet 5   fluvoxaMINE  (LUVOX ) 100 MG tablet Take 200 mg by mouth at bedtime.     lamoTRIgine  (LAMICTAL ) 200 MG tablet Take 200 mg by mouth at bedtime.     multivitamin (ONE-A-DAY MEN'S) TABS tablet Take 1 tablet by mouth at bedtime.     omeprazole  (PRILOSEC) 20 MG capsule Take 1 capsule (20 mg total) by mouth daily. 30 capsule 2   PREVIDENT 5000 BOOSTER PLUS 1.1 % PSTE Place 1 Application onto teeth daily.     rosuvastatin  (CRESTOR ) 20 MG tablet Take 1 tablet (20 mg total) by mouth daily. 90 tablet 3   celecoxib (CELEBREX) 200 MG capsule Take 200 mg by mouth daily. (Patient not taking: Reported on 01/20/2024)     sildenafil  (VIAGRA ) 50 MG tablet Take 1 tablet (50 mg total) by mouth daily as needed for erectile dysfunction (Use 1 hour before sexual activity.). (Patient not taking: Reported on 01/20/2024) 30 tablet 2   traZODone  (DESYREL ) 50 MG tablet Take 1 tablet (50 mg total) by mouth at bedtime. (Patient  not taking: Reported on 01/03/2024) 30 tablet 5   UNABLE TO FIND Med Name: SODIUM  FLORIDE  HASN'T STARTED (Patient not taking: Reported on 01/03/2024)     WELLBUTRIN XL 150 MG 24 hr tablet Take 150 mg by mouth daily.     No current facility-administered medications on file prior to visit.    Allergies:   Allergies  Allergen Reactions   Latex Rash   Levofloxacin Other (See Comments)    Avoid fluoroquinolone antibiotics due to thoracic aortic aneurysm   Penicillins Other (See Comments)    Childhood   Quinolones Other (See Comments)    Patient with ATAA (ascending thoracic aortic aneurysm)      OBJECTIVE:  Physical Exam  Vitals:   01/20/24 0853  BP: 118/72  Pulse: 60  Weight: 170 lb 3.2 oz (77.2 kg)  Height: 5' 9 (1.753 m)   Body mass index is 25.13 kg/m. No results found.  General: well developed, well nourished, very pleasant elderly Caucasian male, seated, in no evident distress  Neurologic Exam Mental Status: Awake and fully alert.  Fluent speech and language.  Oriented to place and time. Recent and remote memory intact. Attention span, concentration and fund of knowledge appropriate. Mood and affect appropriate.  Cranial Nerves: Pupils equal, briskly reactive to light. Extraocular movements full without nystagmus. Visual fields full to confrontation. Hearing intact. Facial sensation intact. Face, tongue, palate moves normally and symmetrically.  Motor: Normal bulk and tone. Normal strength in all tested extremity muscles. Mild postural tremor of upper extremities bilaterally.  No cogwheel rigidity or bradykinesia noted. Gait and Station: Arises from chair without difficulty. Stance is slightly hunched. Gait demonstrates antalgic gait without use of AD.         ASSESSMENT/PLAN: Braedon Guzman. is a 68 y.o. year old male    Severe OSA:  Has not used CPAP since 2023 due to intolerance and no noticeable benefit Discussion today regarding retrial of CPAP due to continued insomnia and excessive daytime fatigue. He was agreeable to  retry Discussed importance of avoiding EtOH use as this can worsen underlying sleep apnea and hypoxia  Insomnia: Excessive daytime fatigue: Suspect ongoing fatigue multifactorial in setting of underlying depression/anxiety, multiple underlying stressors, medication side effects, CLL, excessive EtOH use, untreated sleep apnea with sleep disturbance, and multiple other comorbidities Start trazodone  25-50mg  nightly PRN as previously advised by Dr. Chalice, advised not to combine with clonazepam  Labs for reversible causes benign including B12, TSH and ferritin HLA narcolepsy labs negative Discussed importance of avoiding EtOH use as this could be contributing to symptoms especially with hx of fatty liver, increased risk of developing liver cirrhosis with continued use Continue to follow with [facility] for CLL Continue to follow with behavioral health for depression/anxiety management/monitoring  Tremors: Suspect more essential tremor, does have family hx, also possibly exacerbated by EtOH use and medication side effects No significant worsening, continue to monitor for now    Follow up in 4 to 6 months with Dr. Chalice or call earlier if needed   CC:  PCP: Wendolyn Jenkins Jansky, MD    I personally spent a total of 40 minutes in the care of the patient today including preparing to see the patient, performing a medically appropriate exam/evaluation, counseling and educating, placing orders, and documenting clinical information in the EHR.  Harlene Bogaert, AGNP-BC  [facility] [address] [place], [place] [address]  Phone [phone] Fax [phone] Note: This document was prepared with digital dictation  and possible smart Lobbyist. Any transcriptional errors that result from this process are unintentional.

## 2024-01-19 NOTE — Progress Notes (Signed)
 Vss nad trans to pacu

## 2024-01-19 NOTE — Patient Instructions (Addendum)
 Prescription sent to pharmacy of choice.  YOU HAD AN ENDOSCOPIC PROCEDURE TODAY AT THE [facility]:   Refer to the procedure report that was given to you for any specific questions about what was found during the examination.  If the procedure report does not answer your questions, please call your gastroenterologist to clarify.  If you requested that your care partner not be given the details of your procedure findings, then the procedure report has been included in a sealed envelope for you to review at your convenience later.  YOU SHOULD EXPECT: Some feelings of bloating in the abdomen. Passage of more gas than usual.  Walking can help get rid of the air that was put into your GI tract during the procedure and reduce the bloating. If you had a lower endoscopy (such as a colonoscopy or flexible sigmoidoscopy) you may notice spotting of blood in your stool or on the toilet paper. If you underwent a bowel prep for your procedure, you may not have a normal bowel movement for a few days.  Please Note:  You might notice some irritation and congestion in your nose or some drainage.  This is from the oxygen  used during your procedure.  There is no need for concern and it should clear up in a day or so.  SYMPTOMS TO REPORT IMMEDIATELY:  Following lower endoscopy (colonoscopy or flexible sigmoidoscopy):  Excessive amounts of blood in the stool  Significant tenderness or worsening of abdominal pains  Swelling of the abdomen that is new, acute  Fever of 100F or higher  Following upper endoscopy (EGD)  Vomiting of blood or coffee ground material  New chest pain or pain under the shoulder blades  Painful or persistently difficult swallowing  New shortness of breath  Fever of 100F or higher  Black, tarry-looking stools  For urgent or emergent issues, a gastroenterologist can be reached at any hour by calling [phone]. Do not use MyChart messaging for urgent concerns.    DIET:  We  do recommend a small meal at first, but then you may proceed to your regular diet.  Drink plenty of fluids but you should avoid alcoholic beverages for 24 hours.  ACTIVITY:  You should plan to take it easy for the rest of today and you should NOT DRIVE or use heavy machinery until tomorrow (because of the sedation medicines used during the test).    FOLLOW UP: Our staff will call the number listed on your records the next business day following your procedure.  We will call around 7:15- 8:00 am to check on you and address any questions or concerns that you may have regarding the information given to you following your procedure. If we do not reach you, we will leave a message.     If any biopsies were taken you will be contacted by phone or by letter within the next 1-3 weeks.  Please call us  at [phone] if you have not heard about the biopsies in 3 weeks.    SIGNATURES/CONFIDENTIALITY: You and/or your care partner have signed paperwork which will be entered into your electronic medical record.  These signatures attest to the fact that that the information above on your After Visit Summary has been reviewed and is understood.  Full responsibility of the confidentiality of this discharge information lies with you and/or your care-partner.

## 2024-01-19 NOTE — Op Note (Signed)
 [facility] Patient Name: Eugene Guzman Procedure Date: 01/19/2024 2:04 PM MRN: [id number] Endoscopist: Elspeth P. Leigh , MD, [id number] Age: 68 Referring MD:  Date of Birth: [birth year] Gender: Male Account #: [id number] Procedure:                Upper GI endoscopy Indications:              history of prior episode of epigastric abdominal                            pain, history of gastro-esophageal reflux disease -                            first time exam, currently not taking any antacids Medicines:                Monitored Anesthesia Care Procedure:                Pre-Anesthesia Assessment:                           - Prior to the procedure, a History and Physical                            was performed, and patient medications and                            allergies were reviewed. The patient's tolerance of                            previous anesthesia was also reviewed. The risks                            and benefits of the procedure and the sedation                            options and risks were discussed with the patient.                            All questions were answered, and informed consent                            was obtained. Prior Anticoagulants: The patient has                            taken no anticoagulant or antiplatelet agents. ASA                            Grade Assessment: III - A patient with severe                            systemic disease. After reviewing the risks and                            benefits, the patient was deemed in satisfactory  condition to undergo the procedure.                           After obtaining informed consent, the endoscope was                            passed under direct vision. Throughout the                            procedure, the patient's blood pressure, pulse, and                            oxygen  saturations were monitored continuously. The                             Olympus Scope [id number] was introduced through the                            mouth, and advanced to the second part of duodenum.                            The upper GI endoscopy was accomplished without                            difficulty. The patient tolerated the procedure                            well. Scope In: Scope Out: Findings:                 Esophagogastric landmarks were identified: the                            Z-line was found at 39 cm, the gastroesophageal                            junction was found at 39 cm and the upper extent of                            the gastric folds was found at 39 cm from the                            incisors.                           LA Grade A (one or more mucosal breaks less than 5                            mm, not extending between tops of 2 mucosal folds)                            esophagitis was found 39 cm from the incisors.  The exam of the esophagus was otherwise normal.                           Patchy mild inflammation characterized by erythema                            and granularity was found in the gastric antrum.                           The exam of the stomach was otherwise normal.                           Biopsies were taken with a cold forceps for                            Helicobacter pylori testing.                           Patchy erythematous mucosa was found in the                            duodenal bulb.                           The exam of the duodenum was otherwise normal. Complications:            No immediate complications. Estimated blood loss:                            Minimal. Estimated Blood Loss:     Estimated blood loss was minimal. Impression:               - Esophagogastric landmarks identified.                           - LA Grade A reflux esophagitis.                           - Normal esophagus otherwise.                           - Mild gastritis.                            - Normal stomach otherwise - biopsies taken to rule                            out H pylori.                           - Erythema of the duodenal bulb                           - Normal duodenum otherwise. Recommendation:           - Patient has a contact number available for  emergencies. The signs and symptoms of potential                            delayed complications were discussed with the                            patient. Return to normal activities tomorrow.                            Written discharge instructions were provided to the                            patient.                           - Resume previous diet.                           - Continue present medications.                           - Recommend omeprazole  20mg  / day for 30 days and                            then use as needed for symptoms.                           - Await pathology results. Elspeth P. Kendarious Gudino, MD 01/19/2024 2:45:36 PM This report has been signed electronically.

## 2024-01-19 NOTE — Progress Notes (Signed)
 [facility] History and Physical   Primary Care Physician:  Wendolyn Jenkins Jansky, MD   Reason for Procedure:   GERD / epigastric pain, history of colon polyps  Plan:    EGD and colonoscopy     HPI: Eugene Buer. is a 68 y.o. male  here for EGD and colonoscopy - history of GERD and epigastric pain, EGD to evaluate, no prior exams. HIstory of large / advanced SSP removed on colonoscopy 05/2023 in piecemeal fashion, here for close surveillance follow up.   Otherwise feels well without any cardiopulmonary symptoms.   I have discussed risks / benefits of anesthesia and endoscopic procedure with Eugene Guzman. and they wish to proceed with the exams as outlined today.    Past Medical History:  Diagnosis Date   Allergy    childhood - penicillin, 2014 - latex   Anxiety    Aortic aneurysm (HCC)    Arthritis 2014   right shoulder   CLL (chronic lymphocytic leukemia) (HCC)    Coronary artery calcification seen on CT scan    Depressed    Enlarged prostate    Hx of adenomatous polyp of colon 02/2018   Dr. Milon   Hyperlipidemia    Hypertension    Sleep apnea 2022   Have stopped using CPAP    Past Surgical History:  Procedure Laterality Date   CORONARY PRESSURE/FFR STUDY N/A 04/28/2022   Procedure: INTRAVASCULAR PRESSURE WIRE/FFR STUDY;  Surgeon: Anner Alm ORN, MD;  Location: [facility] INVASIVE CV LAB;  Service: Cardiovascular;  Laterality: N/A;   LEFT HEART CATH AND CORONARY ANGIOGRAPHY N/A 04/28/2022   Procedure: LEFT HEART CATH AND CORONARY ANGIOGRAPHY;  Surgeon: Anner Alm ORN, MD;  Location: [facility] INVASIVE CV LAB;  Service: Cardiovascular;  Laterality: N/A;   TONSILLECTOMY  1961   TRANSURETHRAL RESECTION OF PROSTATE N/A 03/19/2023   Procedure: TRANSURETHRAL RESECTION OF THE PROSTATE (TURP);  Surgeon: Alvaro Ricardo KATHEE Mickey., MD;  Location: [facility] ORS;  Service: Urology;  Laterality: N/A;    Prior to Admission medications   Medication Sig Start Date End  Date Taking? Authorizing Provider  amLODipine  (NORVASC ) 5 MG tablet Take 1 tablet (5 mg total) by mouth daily. 06/15/23  Yes Okey Vina GAILS, MD  aspirin  EC 81 MG tablet Take 1 tablet (81 mg total) by mouth daily. Swallow whole. 02/10/22  Yes Okey Vina GAILS, MD  B COMPLEX VITAMINS PO Take 1 capsule by mouth daily. 100 mg daily   Yes [provider]  Cholecalciferol (VITAMIN D3) 50 MCG (2000 UT) CAPS Take 1 capsule by mouth daily.   Yes [provider]  clonazePAM  (KLONOPIN ) 0.5 MG tablet Take 0.5 mg by mouth 4 (four) times daily as needed for anxiety.   Yes [provider]  ezetimibe  (ZETIA ) 10 MG tablet Take 1 tablet (10 mg total) by mouth daily. 10/15/23  Yes Okey Vina GAILS, MD  fluvoxaMINE  (LUVOX ) 100 MG tablet Take 200 mg by mouth at bedtime.   Yes [provider]  lamoTRIgine  (LAMICTAL ) 200 MG tablet Take 200 mg by mouth at bedtime.   Yes [provider]  multivitamin (ONE-A-DAY MEN'S) TABS tablet Take 1 tablet by mouth at bedtime. 12/03/21  Yes [provider]  PREVIDENT 5000 BOOSTER PLUS 1.1 % PSTE Place 1 Application onto teeth daily. 11/18/22  Yes [provider]  rosuvastatin  (CRESTOR ) 20 MG tablet Take 1 tablet (20 mg total) by mouth daily. 01/05/23  Yes Okey Vina GAILS, MD  WELLBUTRIN XL 150  MG 24 hr tablet Take 150 mg by mouth daily. 07/16/22  Yes [provider]  celecoxib (CELEBREX) 200 MG capsule Take 200 mg by mouth daily. Patient not taking: Reported on 01/03/2024 11/18/23   [provider]  sildenafil  (VIAGRA ) 50 MG tablet Take 1 tablet (50 mg total) by mouth daily as needed for erectile dysfunction (Use 1 hour before sexual activity.). 01/12/23   Okey Vina GAILS, MD  traZODone  (DESYREL ) 50 MG tablet Take 1 tablet (50 mg total) by mouth at bedtime. Patient not taking: Reported on 01/03/2024 07/13/23   Dohmeier, Dedra, MD  UNABLE TO FIND Med Name: SODIUM FLORIDE  HASN'T STARTED Patient not taking: Reported on 01/03/2024     [provider]    Current Outpatient Medications  Medication Sig Dispense Refill   amLODipine  (NORVASC ) 5 MG tablet Take 1 tablet (5 mg total) by mouth daily. 90 tablet 3   aspirin  EC 81 MG tablet Take 1 tablet (81 mg total) by mouth daily. Swallow whole. 90 tablet 3   B COMPLEX VITAMINS PO Take 1 capsule by mouth daily. 100 mg daily     Cholecalciferol (VITAMIN D3) 50 MCG (2000 UT) CAPS Take 1 capsule by mouth daily.     clonazePAM  (KLONOPIN ) 0.5 MG tablet Take 0.5 mg by mouth 4 (four) times daily as needed for anxiety.     ezetimibe  (ZETIA ) 10 MG tablet Take 1 tablet (10 mg total) by mouth daily. 30 tablet 5   fluvoxaMINE  (LUVOX ) 100 MG tablet Take 200 mg by mouth at bedtime.     lamoTRIgine  (LAMICTAL ) 200 MG tablet Take 200 mg by mouth at bedtime.     multivitamin (ONE-A-DAY MEN'S) TABS tablet Take 1 tablet by mouth at bedtime.     PREVIDENT 5000 BOOSTER PLUS 1.1 % PSTE Place 1 Application onto teeth daily.     rosuvastatin  (CRESTOR ) 20 MG tablet Take 1 tablet (20 mg total) by mouth daily. 90 tablet 3   WELLBUTRIN XL 150 MG 24 hr tablet Take 150 mg by mouth daily.     celecoxib (CELEBREX) 200 MG capsule Take 200 mg by mouth daily. (Patient not taking: Reported on 01/03/2024)     sildenafil  (VIAGRA ) 50 MG tablet Take 1 tablet (50 mg total) by mouth daily as needed for erectile dysfunction (Use 1 hour before sexual activity.). 30 tablet 2   traZODone  (DESYREL ) 50 MG tablet Take 1 tablet (50 mg total) by mouth at bedtime. (Patient not taking: Reported on 01/03/2024) 30 tablet 5   UNABLE TO FIND Med Name: SODIUM FLORIDE  HASN'T STARTED (Patient not taking: Reported on 01/03/2024)     Current Facility-Administered Medications  Medication Dose Route Frequency Provider Last Rate Last Admin   0.9 %  sodium chloride  infusion  500 mL Intravenous Once Leigh Elspeth SQUIBB, MD        Allergies as of 01/19/2024 - Review Complete 01/19/2024  Allergen Reaction Noted   Latex Rash  04/28/2022   Levofloxacin Other (See Comments) 12/28/2022   Penicillins Other (See Comments) 09/06/2013   Quinolones Other (See Comments) 07/05/2023    Family History  Problem Relation Age of Onset   Hypertension Mother    Cancer Mother    Hearing loss Mother    Heart attack Father 43   Arthritis Father    Depression Father    Heart disease Father    Depression Paternal Uncle    Depression Paternal Uncle    Ovarian cancer Maternal Grandmother    Cancer Maternal  Grandmother    Hearing loss Maternal Grandfather    Heart disease Maternal Grandfather    Hypertension Maternal Grandfather    Stroke Maternal Grandfather    Colon cancer Paternal Grandfather 44   Cancer Paternal Grandfather    Esophageal cancer Neg Hx    Rectal cancer Neg Hx    Stomach cancer Neg Hx     Social History   Socioeconomic History   Marital status: Married    Spouse name: Rollene   Number of children: 0   Years of education: Not on file   Highest education level: Bachelor's degree (e.g., BA, AB, BS)  Occupational History   Occupation: retired  Tobacco Use   Smoking status: Former    Current packs/day: 0.00    Average packs/day: 0.3 packs/day for 30.0 years (7.5 ttl pk-yrs)    Types: Cigarettes    Quit date: 01/04/2015    Years since quitting: 9.0    Passive exposure: Never   Smokeless tobacco: Never  Vaping Use   Vaping status: Never Used  Substance and Sexual Activity   Alcohol use: Yes    Alcohol/week: 12.0 standard drinks of alcohol    Types: 12 Cans of beer per week    Comment: socially   Drug use: No   Sexual activity: Yes    Birth control/protection: None  Other Topics Concern   Not on file  Social History Narrative   Lives with wife   Right handed   Caffeine: ice tea daily   Social Drivers of Health   Financial Resource Strain: Low Risk  (09/30/2023)   Overall Financial Resource Strain (CARDIA)    Difficulty of Paying Living Expenses: Not hard at all  Food Insecurity: No  Food Insecurity (09/30/2023)   Hunger Vital Sign    Worried About Running Out of Food in the Last Year: Never true    Ran Out of Food in the Last Year: Never true  Transportation Needs: No Transportation Needs (09/30/2023)   PRAPARE - Administrator, Civil Service (Medical): No    Lack of Transportation (Non-Medical): No  Physical Activity: Sufficiently Active (09/30/2023)   Exercise Vital Sign    Days of Exercise per Week: 3 days    Minutes of Exercise per Session: 60 min  Stress: Stress Concern Present (09/30/2023)   [employer] of Occupational Health - Occupational Stress Questionnaire    Feeling of Stress : Very much  Social Connections: Moderately Integrated (09/30/2023)   Social Connection and Isolation Panel    Frequency of Communication with Friends and Family: More than three times a week    Frequency of Social Gatherings with Friends and Family: More than three times a week    Attends Religious Services: 1 to 4 times per year    Active Member of [employer] or Organizations: No    Attends Banker Meetings: Patient declined    Marital Status: Married  Catering manager Violence: Not At Risk (09/30/2023)   Humiliation, Afraid, Rape, and Kick questionnaire    Fear of Current or Ex-Partner: No    Emotionally Abused: No    Physically Abused: No    Sexually Abused: No    Review of Systems: All other review of systems negative except as mentioned in the HPI.  Physical Exam: Vital signs BP 119/73   Pulse 64   Temp (!) 97.3 F (36.3 C) (Temporal)   Ht 5' 9 (1.753 m)   Wt 174 lb (78.9 kg)   SpO2 98%  BMI 25.70 kg/m   General:   Alert,  Well-developed, pleasant and cooperative in NAD Lungs:  Clear throughout to auscultation.   Heart:  Regular rate and rhythm Abdomen:  Soft, nontender and nondistended.   Neuro/Psych:  Alert and cooperative. Normal mood and affect. A and O x 3  Marcey Naval, MD [facility]

## 2024-01-19 NOTE — Progress Notes (Signed)
I have reviewed the patient's medical history in detail and updated the computerized patient record.

## 2024-01-20 ENCOUNTER — Encounter: Payer: Self-pay | Admitting: Adult Health

## 2024-01-20 ENCOUNTER — Telehealth: Payer: Self-pay

## 2024-01-20 ENCOUNTER — Ambulatory Visit: Payer: Medicare Other | Admitting: Adult Health

## 2024-01-20 VITALS — BP 118/72 | HR 60 | Ht 69.0 in | Wt 170.2 lb

## 2024-01-20 DIAGNOSIS — G4730 Sleep apnea, unspecified: Secondary | ICD-10-CM

## 2024-01-20 DIAGNOSIS — Z789 Other specified health status: Secondary | ICD-10-CM

## 2024-01-20 DIAGNOSIS — G4700 Insomnia, unspecified: Secondary | ICD-10-CM

## 2024-01-20 DIAGNOSIS — G4710 Hypersomnia, unspecified: Secondary | ICD-10-CM | POA: Diagnosis not present

## 2024-01-20 DIAGNOSIS — G4734 Idiopathic sleep related nonobstructive alveolar hypoventilation: Secondary | ICD-10-CM

## 2024-01-20 DIAGNOSIS — R251 Tremor, unspecified: Secondary | ICD-10-CM

## 2024-01-20 DIAGNOSIS — F1010 Alcohol abuse, uncomplicated: Secondary | ICD-10-CM

## 2024-01-20 DIAGNOSIS — G4719 Other hypersomnia: Secondary | ICD-10-CM

## 2024-01-20 MED ORDER — TRAZODONE HCL 50 MG PO TABS: 25.0000 mg | ORAL_TABLET | Freq: Every evening | ORAL | 5 refills | Status: AC | PRN

## 2024-01-20 NOTE — Patient Instructions (Addendum)
 Your Plan:  Restart use of CPAP nightly as this can help with insomnia, daytime energy levels as well as depression/anxiety   Start trazodone  25-50mg  nightly as needed to help with insomnia, please do not combine with Klonopin    Continue to follow with behavioral health for depression management   Continue to work on decreasing alcohol intake as this can contribute to insomnia, daytime fatigue as well as tremors       Follow up with Dr. Chalice in 4-6 months or call earlier if needed      Thank you for coming to see us  at [facility]. I hope we have been able to provide you high quality care today.  You may receive a patient satisfaction survey over the next few weeks. We would appreciate your feedback and comments so that we may continue to improve ourselves and the health of our patients.

## 2024-01-20 NOTE — Telephone Encounter (Signed)
  Follow up Call-     01/19/2024    1:04 PM 06/01/2023   10:22 AM  Call back number  Post procedure Call Back phone  # [phone]  Permission to leave phone message Yes Yes     Patient questions:  Do you have a fever, pain , or abdominal swelling? No. Pain Score  0 *  Have you tolerated food without any problems? Yes.    Have you been able to return to your normal activities? Yes.    Do you have any questions about your discharge instructions: Diet   No. Medications  No. Follow up visit  No.  Do you have questions or concerns about your Care? No.  Actions: * If pain score is 4 or above: No action needed, pain <4.

## 2024-01-24 LAB — SURGICAL PATHOLOGY

## 2024-01-25 ENCOUNTER — Ambulatory Visit: Payer: Self-pay | Admitting: Gastroenterology

## 2024-01-27 ENCOUNTER — Telehealth: Payer: Self-pay | Admitting: Adult Health

## 2024-01-27 NOTE — Telephone Encounter (Signed)
 Spoke w/Pt who was at his pharmacy picking up his new medication trazodone  and was concerned because the pharmacist told him about the interaction with his Luvox  that may cause serotonin syndrome. Informed Pt NP is not in the office today but will speak to the on call provider and call him back with any recommendations. Pt voiced understanding and thanks for taking the call.

## 2024-01-27 NOTE — Telephone Encounter (Signed)
 Pt asking to speak with RN re: pharmacy informing him of a possible interaction between traZODone  (DESYREL ) 50 MG tablet   and his fluvoxaMINE  (LUVOX ) 100 MG tablet

## 2024-01-27 NOTE — Telephone Encounter (Signed)
 Spoke w/Pt regarding medication concerns. Informed pt had spoken with on call provider who had stated Pt would likely be safe to take the 0.5 tablet dose of trazodone  as stated in the script instructions but if he is going out of town, as he had reported in the earlier conversation, it may be better if he waits until he returns home before starting the medication in the event he has any type of reaction. Pt voiced understanding and if he takes the medication he will only take 0.5 tab (25 mg) dose. Pt voiced thanks for calling him back so quickly.

## 2024-02-15 DIAGNOSIS — C9110 Chronic lymphocytic leukemia of B-cell type not having achieved remission: Secondary | ICD-10-CM | POA: Diagnosis not present

## 2024-02-16 ENCOUNTER — Ambulatory Visit (HOSPITAL_COMMUNITY)
Admission: RE | Admit: 2024-02-16 | Discharge: 2024-02-16 | Disposition: A | Discharge status: Home / Self Care | Source: Ambulatory Visit | Attending: Cardiology | Admitting: Cardiology

## 2024-02-16 DIAGNOSIS — I10 Essential (primary) hypertension: Secondary | ICD-10-CM | POA: Diagnosis not present

## 2024-02-16 DIAGNOSIS — I7121 Aneurysm of the ascending aorta, without rupture: Secondary | ICD-10-CM | POA: Insufficient documentation

## 2024-02-16 DIAGNOSIS — R0602 Shortness of breath: Secondary | ICD-10-CM | POA: Insufficient documentation

## 2024-02-16 DIAGNOSIS — C9110 Chronic lymphocytic leukemia of B-cell type not having achieved remission: Secondary | ICD-10-CM | POA: Diagnosis not present

## 2024-02-16 DIAGNOSIS — E785 Hyperlipidemia, unspecified: Secondary | ICD-10-CM

## 2024-02-16 DIAGNOSIS — I2510 Atherosclerotic heart disease of native coronary artery without angina pectoris: Secondary | ICD-10-CM | POA: Insufficient documentation

## 2024-02-16 LAB — ECHOCARDIOGRAM COMPLETE
AR max vel: 1.89 cm2
AV Area VTI: 2.04 cm2
AV Area mean vel: 1.92 cm2
AV Mean grad: 7 mmHg
AV Peak grad: 12.8 mmHg
Ao pk vel: 1.79 m/s
Area-P 1/2: 3.5 cm2
S' Lateral: 2.8 cm

## 2024-02-21 ENCOUNTER — Ambulatory Visit: Payer: Self-pay | Admitting: Internal Medicine

## 2024-02-24 ENCOUNTER — Ambulatory Visit: Admitting: Psychology

## 2024-02-24 DIAGNOSIS — F329 Major depressive disorder, single episode, unspecified: Secondary | ICD-10-CM

## 2024-02-24 DIAGNOSIS — F410 Panic disorder [episodic paroxysmal anxiety] without agoraphobia: Secondary | ICD-10-CM

## 2024-02-24 DIAGNOSIS — F4321 Adjustment disorder with depressed mood: Secondary | ICD-10-CM

## 2024-02-24 DIAGNOSIS — F331 Major depressive disorder, recurrent, moderate: Secondary | ICD-10-CM

## 2024-02-24 NOTE — Progress Notes (Signed)
 [facility] Counselor Initial Adult Exam  Name: Eugene Guzman. Date: 02/24/2024 MRN: [id number] DOB: January 06, 1956 PCP: Wendolyn Jenkins Jansky, MD  Time spent: 60 minutes  Guardian/Payee:  N/A    Paperwork requested: No   Reason for Visit /Presenting Problem: Symptoms of Depression, Grief and Panic   Mental Status Exam: Appearance:   Casual     Behavior:  Appropriate  Motor:  Normal  Speech/Language:   Clear and Coherent  Affect:  Appropriate  Mood:  depressed  Thought process:  normal  Thought content:    WNL  Sensory/Perceptual disturbances:    WNL  Orientation:  oriented to person, place, and situation  Attention:  Good  Concentration:  Good  Memory:  WNL  Fund of knowledge:   Good  Insight:    Good  Judgment:   Good  Impulse Control:  Good    Reported Symptoms:  sadness, powerlessness, anxiety/panic  Risk Assessment: Danger to Self:  No Self-injurious Behavior: No Danger to Others: No Duty to Warn:no Physical Aggression / Violence:No  Access to Firearms a concern: No  Gang Involvement:No  Patient / guardian was educated about steps to take if suicide or homicide risk level increases between visits: n/a While future psychiatric events cannot be accurately predicted, the patient does not currently require acute inpatient psychiatric care and does not currently meet [place]  involuntary commitment criteria.  Substance Abuse History: Current substance abuse: No     Past Psychiatric History:   Previous psychological history is significant for depression and panic Outpatient Providers:Lisa Polis History of  Psych Hospitalization: No  Psychological Testing: N/A   Abuse History:  Victim of: No., N/A   Report needed: No. Victim of Neglect:No. Perpetrator of N/A  Witness / Exposure to Domestic Violence: No   Protective Services Involvement: No  Witness to [employer] Violence:  No   Family History:  Family History  Problem Relation Age of Onset   Hypertension Mother    Cancer Mother    Hearing loss Mother    Heart attack Father 3   Arthritis Father    Depression Father    Heart disease Father    Depression Paternal Uncle    Depression Paternal Uncle    Ovarian cancer Maternal Grandmother    Cancer Maternal Grandmother    Hearing loss Maternal Grandfather    Heart disease Maternal Grandfather    Hypertension Maternal Grandfather    Stroke Maternal Grandfather    Colon cancer Paternal Grandfather 50  Cancer Paternal Grandfather    Esophageal cancer Neg Hx    Rectal cancer Neg Hx    Stomach cancer Neg Hx     Living situation: the patient lives with their spouse  Sexual Orientation: Straight  Relationship Status: married  Name of spouse / other:unknown If a parent, number of children / ages:none  Support Systems: N/A  Surveyor, quantity Stress:  No   Income/Employment/Disability: Neurosurgeon: unknown  Educational History: Education: Risk manager: unknown  Any cultural differences that may affect / interfere with treatment:  not applicable   Recreation/Hobbies: unknown  Stressors: Loss of mother    Strengths: Supportive Relationships and Able to Communicate Effectively  Barriers:  unknown   Legal History: Pending legal issue / charges: The patient has no significant history of legal issues. History of legal issue / charges: N/A  Medical History/Surgical History: reviewed Past Medical History:  Diagnosis Date   Allergy    childhood - penicillin, 2014 - latex   Anxiety    Aortic aneurysm  (HCC)    Arthritis 2014   right shoulder   CLL (chronic lymphocytic leukemia) (HCC)    Coronary artery calcification seen on CT scan    Depressed    Enlarged prostate    Hx of adenomatous polyp of colon 02/2018   Dr. Milon   Hyperlipidemia    Hypertension    Sleep apnea 2022   Have stopped using CPAP    Past Surgical History:  Procedure Laterality Date   CORONARY PRESSURE/FFR STUDY N/A 04/28/2022   Procedure: INTRAVASCULAR PRESSURE WIRE/FFR STUDY;  Surgeon: Anner Alm ORN, MD;  Location: [facility] INVASIVE CV LAB;  Service: Cardiovascular;  Laterality: N/A;   LEFT HEART CATH AND CORONARY ANGIOGRAPHY N/A 04/28/2022   Procedure: LEFT HEART CATH AND CORONARY ANGIOGRAPHY;  Surgeon: Anner Alm ORN, MD;  Location: [facility] INVASIVE CV LAB;  Service: Cardiovascular;  Laterality: N/A;   TONSILLECTOMY  1961   TRANSURETHRAL RESECTION OF PROSTATE N/A 03/19/2023   Procedure: TRANSURETHRAL RESECTION OF THE PROSTATE (TURP);  Surgeon: Alvaro Ricardo KATHEE Mickey., MD;  Location: [facility];  Service: Urology;  Laterality: N/A;    Medications: Current Outpatient Medications  Medication Sig Dispense Refill   amLODipine  (NORVASC ) 5 MG tablet Take 1 tablet (5 mg total) by mouth daily. 90 tablet 3   aspirin  EC 81 MG tablet Take 1 tablet (81 mg total) by mouth daily. Swallow whole. 90 tablet 3   B COMPLEX VITAMINS PO Take 1 capsule by mouth daily. 100 mg daily     celecoxib (CELEBREX) 200 MG capsule Take 200 mg by mouth daily. (Patient not taking: Reported on 01/20/2024)     Cholecalciferol (VITAMIN D3) 50 MCG (2000 UT) CAPS Take 1 capsule by mouth daily.     clonazePAM  (KLONOPIN ) 0.5 MG tablet Take 0.5 mg by mouth 4 (four) times daily as needed for anxiety.     ezetimibe  (ZETIA ) 10 MG tablet Take 1 tablet (10 mg total) by mouth daily. 30 tablet 5   fluvoxaMINE  (LUVOX ) 100 MG tablet Take 200 mg by mouth at bedtime.     lamoTRIgine  (LAMICTAL ) 200 MG tablet Take 200 mg by mouth at bedtime.     multivitamin (ONE-A-DAY  MEN'S) TABS tablet Take 1 tablet by mouth at bedtime.     omeprazole  (PRILOSEC) 20 MG capsule Take 1 capsule (20 mg total) by mouth daily. 30 capsule 2   PREVIDENT 5000 BOOSTER PLUS 1.1 % PSTE Place 1 Application onto teeth  daily.     rosuvastatin  (CRESTOR ) 20 MG tablet Take 1 tablet (20 mg total) by mouth daily. 90 tablet 3   traZODone  (DESYREL ) 50 MG tablet Take 0.5-1 tablets (25-50 mg total) by mouth at bedtime as needed for sleep. 30 tablet 5   WELLBUTRIN XL 150 MG 24 hr tablet Take 150 mg by mouth daily.     No current facility-administered medications for this visit.    Allergies  Allergen Reactions   Latex Rash   Levofloxacin Other (See Comments)    Avoid fluoroquinolone antibiotics due to thoracic aortic aneurysm   Penicillins Other (See Comments)    Childhood   Quinolones Other (See Comments)    Patient with ATAA (ascending thoracic aortic aneurysm)    Diagnoses:  Major Depression, Panic, Grief  Plan of Care: Outpatient Psychotherapy and medication management  Initial Note: He states he has taken psychotropic medication for years and sees Olam Holly at [facility]. States [place] and feels he has suffered depression most of his life. He only took vacation once a year and would get really depressed when it was over. First significant depression was at 33 when he had a break-up. Had more minor depressive episodes earlier in life. Change has always been difficult for him. Also has had severe panic attacks. First one he remembers was first day of his fifth year of college. He was distressed that this was the end of his college career. He recalls having a panic when on business trip to [place] in a [employer]. He had to come home. 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 He was offered early retirement and had a panic when he realized that he just quit a job of 30 years and his life was changing. When he has panic, he  doesn't have a fear of death, rather it is a fear of something he can't control. His anxiety was less prevalent in his life than the depression.  He says my major problem in life is depression and panic. His mother passed and he is struggling with her loss. She died 2 weeks ago at 52 and was a vibrant person for 92 years. In 1 year, she had rapid dementia. He says, she was his best friend. He has a small farm down the road from where his mother lived. He says I don't handle death well. His reason to seek therapy was before mother was deteriorating. He had tried therapy in the past and it was not helpful. Also, he has had medical challenges himself, but says it does not cause him distress. He has been on a number of different anti-depressants over the years.  He contacted me for his depression, ut says the grief is now most prevalent.  He has 1 sister in [place]. She is 3 years older and they have a good relationship, but she has kids and grandchildren and they keep her busy. He married at 43 and wife was 33. His wife will occasionally tell him she blames him for not having kids. He told her when they married that he was too old to have kids. He says it was his inability to handle change. This is his only marriage for both he and his wife. Wife is from [place]  and has 3 sisters and one brother. None of her family in this area. He left [employer] at 52 (too early). 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. Wife retired in 2013.  Will complete intake at next visit. Patient states he wants to continue treatment and prefers face to face when possible.   Goals/Tx. Plan: Patient states that he is seeking symptom relief from life long depressive episodes. First, however, he needs support and strategies to manage grief over the recent (2 weeks) loss of his mother. Will engage in grief counseling to reduce current despair. In addition, treatment will address his anxiety that contributes to  episodic panic attacks. This will involve relaxation techniques and other behavioral strategies  to facilitate greater control over his anxiety. Patient agrees to plan with a goal date of 12-25.   Patient was seen face to face in Provider's office.  Session note: Patient states that he is stressed because his AC is out at the beach and at home. This is overwhelming for [employer]. He says that his niece has purchased his mother's house. He says it is done and he is waiting for them to start working on it to flip the house. The condo in [place] is going to be settled in that his sister will buy his 1/4 of the house. He says he has had days where he sleeps all day and gets nothing done. He isn't sure if it is depression or his medical condition (Leukemia).                 He is struggling to sleep but hasn't tried his meds. States his drinking (beer) is improved but acknowledges he is not where he needs to be. Also, says the marital relationship is up and down. He says his energy level is one of his biggest problems. He has a poor record of complying with healthy behaviors. He is not disciplined at all with regard to eating, exercising or healthy habits. He is not doing what he needs to get better sleep. He also is not using his sleep apnea Cpap machine. He agrees that he needs to get back in habit of using sleep apnea machine and medication for sleep.     CONI ALM KERNS, PhD 9:40a-10:40a 50 minutes

## 2024-03-01 ENCOUNTER — Ambulatory Visit: Admitting: Physician Assistant

## 2024-03-14 ENCOUNTER — Ambulatory Visit (INDEPENDENT_AMBULATORY_CARE_PROVIDER_SITE_OTHER): Admitting: Family Medicine

## 2024-03-14 ENCOUNTER — Encounter: Payer: Self-pay | Admitting: Family Medicine

## 2024-03-14 ENCOUNTER — Ambulatory Visit: Payer: Self-pay | Admitting: Family Medicine

## 2024-03-14 VITALS — BP 118/58 | HR 73 | Temp 97.5°F | Resp 18 | Ht 69.0 in | Wt 169.4 lb

## 2024-03-14 DIAGNOSIS — I7121 Aneurysm of the ascending aorta, without rupture: Secondary | ICD-10-CM

## 2024-03-14 DIAGNOSIS — R351 Nocturia: Secondary | ICD-10-CM

## 2024-03-14 DIAGNOSIS — C9110 Chronic lymphocytic leukemia of B-cell type not having achieved remission: Secondary | ICD-10-CM

## 2024-03-14 DIAGNOSIS — Z0000 Encounter for general adult medical examination without abnormal findings: Secondary | ICD-10-CM | POA: Diagnosis not present

## 2024-03-14 DIAGNOSIS — I10 Essential (primary) hypertension: Secondary | ICD-10-CM

## 2024-03-14 DIAGNOSIS — F339 Major depressive disorder, recurrent, unspecified: Secondary | ICD-10-CM

## 2024-03-14 DIAGNOSIS — N401 Enlarged prostate with lower urinary tract symptoms: Secondary | ICD-10-CM

## 2024-03-14 DIAGNOSIS — E7800 Pure hypercholesterolemia, unspecified: Secondary | ICD-10-CM | POA: Diagnosis not present

## 2024-03-14 LAB — LIPID PANEL
Cholesterol: 121 mg/dL (ref 0–200)
HDL: 86.1 mg/dL (ref >39.00)
LDL Cholesterol: 28 mg/dL (ref 0–99)
NonHDL: 34.96
Total CHOL/HDL Ratio: 1
Triglycerides: 35 mg/dL (ref 0.0–149.0)
VLDL: 7 mg/dL (ref 0.0–40.0)

## 2024-03-14 NOTE — Patient Instructions (Addendum)
 It was very nice to see you today!  Drinking-check on Naltrexone   PLEASE NOTE:  If you had any lab tests please let us  know if you have not heard back within a few days. You may see your results on MyChart before we have a chance to review them but we will give you a call once they are reviewed by us . If we ordered any referrals today, please let us  know if you have not heard from their office within the next week.   Please try these tips to maintain a healthy lifestyle:  Eat most of your calories during the day when you are active. Eliminate processed foods including packaged sweets (pies, cakes, cookies), reduce intake of potatoes, white bread, white pasta, and white rice. Look for whole grain options, oat flour or almond flour.  Each meal should contain half fruits/vegetables, one quarter protein, and one quarter carbs (no bigger than a computer mouse).  Cut down on sweet beverages. This includes juice, soda, and sweet tea. Also watch fruit intake, though this is a healthier sweet option, it still contains natural sugar! Limit to 3 servings daily.  Drink at least 1 glass of water  with each meal and aim for at least 8 glasses per day  Exercise at least 150 minutes every week.

## 2024-03-14 NOTE — Progress Notes (Signed)
 Cholesterol awesome!  Same dose

## 2024-03-14 NOTE — Progress Notes (Signed)
 Phone: [phone]   Subjective:  Patient 68 y.o. male presenting for annual physical.  Chief Complaint  Patient presents with   Annual Exam    CPE Fasting    Annual Discussed the use of AI scribe software for clinical note transcription with the patient, who gave verbal consent to proceed.  History of Present Illness Eugene Vernon Kulinski Jr. Will is a 68 year old male with chronic lymphocytic leukemia and hypertension who presents for an annual physical exam.  He underwent prostate surgery approximately a year ago, which did not alleviate his urinary symptoms. He experiences nocturia, getting up every hour at night to urinate, contributing to his fatigue. He has not yet tried trazodone .  He is currently taking several medications: amlodipine  5 mg daily for blood pressure, rosuvastatin  20 mg and Zetia  10 mg daily for cholesterol, clonazepam  as needed, Wellbutrin 150 mg, lamotrigine  200 mg, and Lovaza 200 mg. He also takes B12 and vitamin D  supplements. He mentions not taking clonazepam  regularly due to its sedative effects. His blood pressure readings at home are typically around 125/75 mmHg, though it was elevated this morning. He has a history of aortic aneurysm.  Regarding his CLL, he notes a loss of taste and appetite, particularly for foods he previously enjoyed, such as [place] cuisine. Despite these changes, he has not lost weight. He describes himself as 'chronically depressed' following a difficult year, including the passing of his mother and family-related stress.  He experiences shortness of breath when climbing stairs but not during other physical activities. He has been sedentary for the past year due to family obligations but plans to start exercising at the [facility].  He reports a history of knee injuries, with the right knee currently swollen and painful, particularly when standing. He has not been taking medication for this pain.  He has undergone multiple blood tests  this year, primarily at the cancer center, which showed stable cancer markers but slightly elevated liver enzymes. He acknowledges not achieving abstinence from drinking.     See problem oriented charting- ROS- ROS: Gen: no fever, chills  Skin: no rash, itching ENT: no ear pain, ear drainage, nasal congestion, rhinorrhea, sinus pressure, sore throat Eyes: no blurry vision, double vision Resp: no cough, wheeze, some doe w/stairs only.   CV: no CP, palpitations, LE edema,  GI: no heartburn, n/v/d/c, abd pain GU: hpi MSK:L knee chronic-seeing ortho. Neuro: no dizziness, headache, weakness, vertigo Psych: no SI   The following were reviewed and entered/updated in epic: Past Medical History:  Diagnosis Date   Allergy    childhood - penicillin, 2014 - latex   Anxiety 1990   Aortic aneurysm (HCC)    Arthritis 2014   right shoulder   CLL (chronic lymphocytic leukemia) (HCC)    Coronary artery calcification seen on CT scan    Depressed    Enlarged prostate    Hx of adenomatous polyp of colon 02/2018   Dr. Milon   Hyperlipidemia    Hypertension 2021   Sleep apnea 2022   Have stopped using CPAP   Patient Active Problem List   Diagnosis Date Noted   COVID-19 07/27/2023   Prostate hypertrophy 03/19/2023   Lymphadenopathy, cervical 11/13/2022   CLL (chronic lymphocytic leukemia) (HCC) 07/06/2022   Abnormal nuclear stress test    Atypical angina (HCC)    Panic disorder without agoraphobia 03/02/2022   Depression 03/02/2022   Fatigue 03/02/2022   Severe sleep apnea 02/19/2022   Severe obstructive sleep apnea-hypopnea syndrome 09/10/2021  Chronic intermittent hypoxia with obstructive sleep apnea 09/10/2021   Sleep apnea, obstructive 07/22/2021   Snoring 07/22/2021   Vitamin D  deficiency 05/07/2021   Hypertension 01/31/2021   BPH (benign prostatic hyperplasia) 01/31/2021   Depression, recurrent (HCC) 01/31/2021   Hyperlipidemia 01/31/2021   CAD (coronary artery disease)  01/31/2021   Ascending aortic aneurysm (HCC) 01/31/2021   Aneurysm, aortic (HCC) 07/07/2019   History of adenomatous polyp of colon 02/28/2018   Upper airway cough syndrome 07/19/2015   Past Surgical History:  Procedure Laterality Date   CORONARY PRESSURE/FFR STUDY N/A 04/28/2022   Procedure: INTRAVASCULAR PRESSURE WIRE/FFR STUDY;  Surgeon: Anner Alm ORN, MD;  Location: [facility] INVASIVE CV LAB;  Service: Cardiovascular;  Laterality: N/A;   LEFT HEART CATH AND CORONARY ANGIOGRAPHY N/A 04/28/2022   Procedure: LEFT HEART CATH AND CORONARY ANGIOGRAPHY;  Surgeon: Anner Alm ORN, MD;  Location: [facility] INVASIVE CV LAB;  Service: Cardiovascular;  Laterality: N/A;   TONSILLECTOMY  1961   TRANSURETHRAL RESECTION OF PROSTATE N/A 03/19/2023   Procedure: TRANSURETHRAL RESECTION OF THE PROSTATE (TURP);  Surgeon: Alvaro Ricardo KATHEE Mickey., MD;  Location: [facility] ORS;  Service: Urology;  Laterality: N/A;    Family History  Problem Relation Age of Onset   Hypertension Mother    Cancer Mother    Hearing loss Mother    Heart attack Father 69   Arthritis Father    Depression Father    Heart disease Father    Cancer Sister    Hearing loss Sister    Depression Paternal Uncle    Depression Paternal Uncle    Ovarian cancer Maternal Grandmother    Cancer Maternal Grandmother    Hearing loss Maternal Grandfather    Heart disease Maternal Grandfather    Hypertension Maternal Grandfather    Stroke Maternal Grandfather    Colon cancer Paternal Grandfather 24   Cancer Paternal Grandfather    Esophageal cancer Neg Hx    Rectal cancer Neg Hx    Stomach cancer Neg Hx     Medications- reviewed and updated Current Outpatient Medications  Medication Sig Dispense Refill   amLODipine  (NORVASC ) 5 MG tablet Take 1 tablet (5 mg total) by mouth daily. 90 tablet 3   aspirin  EC 81 MG tablet Take 1 tablet (81 mg total) by mouth daily. Swallow whole. 90 tablet 3   B COMPLEX VITAMINS PO Take 1 capsule by mouth daily. 100 mg  daily     celecoxib (CELEBREX) 200 MG capsule Take 200 mg by mouth daily. (Patient taking differently: Take 200 mg by mouth daily.)     Cholecalciferol (VITAMIN D3) 50 MCG (2000 UT) CAPS Take 1 capsule by mouth daily.     clonazePAM  (KLONOPIN ) 0.5 MG tablet Take 0.5 mg by mouth 4 (four) times daily as needed for anxiety.     ezetimibe  (ZETIA ) 10 MG tablet Take 1 tablet (10 mg total) by mouth daily. 30 tablet 5   fluvoxaMINE  (LUVOX ) 100 MG tablet Take 200 mg by mouth at bedtime.     lamoTRIgine  (LAMICTAL ) 200 MG tablet Take 200 mg by mouth at bedtime.     multivitamin (ONE-A-DAY MEN'S) TABS tablet Take 1 tablet by mouth at bedtime.     PREVIDENT 5000 BOOSTER PLUS 1.1 % PSTE Place 1 Application onto teeth daily.     rosuvastatin  (CRESTOR ) 20 MG tablet Take 1 tablet (20 mg total) by mouth daily. 90 tablet 3   WELLBUTRIN XL 150 MG 24 hr tablet Take 150 mg by mouth daily.  traZODone  (DESYREL ) 50 MG tablet Take 0.5-1 tablets (25-50 mg total) by mouth at bedtime as needed for sleep. (Patient not taking: Reported on 03/14/2024) 30 tablet 5   No current facility-administered medications for this visit.    Allergies-reviewed and updated Allergies  Allergen Reactions   Latex Rash   Levofloxacin Other (See Comments)    Avoid fluoroquinolone antibiotics due to thoracic aortic aneurysm   Penicillins Other (See Comments)    Childhood   Quinolones Other (See Comments)    Patient with ATAA (ascending thoracic aortic aneurysm)    Social History   Social History Narrative   Lives with wife   Right handed   Caffeine: ice tea daily   Objective  Objective:  BP (!) 118/58 (BP Location: Left Arm, Patient Position: Sitting, Cuff Size: Normal)   Pulse 73   Temp (!) 97.5 F (36.4 C) (Temporal)   Resp 18   Ht 5' 9 (1.753 m)   Wt 169 lb 6 oz (76.8 kg)   SpO2 98%   BMI 25.01 kg/m  Physical Exam  Gen: WDWN NAD HEENT: NCAT, conjunctiva not injected, sclera nonicteric TM WNL B, OP moist, no  exudates  NECK:  supple, no thyromegaly, no nodes, no carotid bruits CARDIAC: RRR, S1S2+, no murmur. DP 2+B LUNGS: CTAB. No wheezes ABDOMEN:  BS+, soft, NTND, No HSM, no masses EXT:  no edema MSK: no gross abnormalities. MS 5/5 all 4 NEURO: A&O x3.  CN II-XII intact. tremors PSYCH: normal mood. Good eye contact   Reviewed labs from [facility]from August    Assessment and Plan   Health Maintenance counseling: 1. Anticipatory guidance: Patient counseled regarding regular dental exams q6 months, eye exams yearly, avoiding smoking and second hand smoke, limiting alcohol to 2 beverages per day.   2. Risk factor reduction:  Advised patient of need for regular exercise and diet rich in fruits and vegetables to reduce risk of heart attack and stroke. Exercise- working on it.   Wt Readings from Last 3 Encounters:  03/14/24 169 lb 6 oz (76.8 kg)  01/20/24 170 lb 3.2 oz (77.2 kg)  01/19/24 174 lb (78.9 kg)   3. Immunizations/screenings/ancillary studies Immunization History  Administered Date(s) Administered    sv, Bivalent, Protein Subunit Rsvpref,pf [place]) 06/23/2022   Hepatitis A, Adult 04/28/2023, 10/29/2023   Hepb-cpg 04/28/2023, 06/02/2023   INFLUENZA, HIGH DOSE SEASONAL PF 07/08/2022   Influenza, Seasonal, Injecte, Preservative Fre 04/11/2014   Influenza,inj,Quad PF,6+ Mos 05/14/2015, 04/11/2016   Influenza-Unspecified 04/22/2021, 05/10/2023   PFIZER(Purple Top)SARS-COV-2 Vaccination 08/07/2019, 08/28/2019, 05/07/2020, 10/08/2020, 03/18/2021   PNEUMOCOCCAL CONJUGATE-20 05/06/2021   Pfizer(Comirnaty)Fall Seasonal Vaccine 12 years and older 05/18/2022, 05/10/2023   Zoster Recombinant(Shingrix) 12/04/2020, 12/24/2020, 06/10/2021   Health Maintenance Due  Topic Date Due   DTaP/Tdap/Td (1 - Tdap) Never done   Influenza Vaccine  02/04/2024    4. Skin cancer screening- Iadvised regular sunscreen use. Denies worrisome, changing, or new skin lesions.  5. Smoking associated screening:  non smoker - Wellness examination  Pure hypercholesterolemia -     Lipid panel   Wellness-antic guidance. Assessment and Plan Assessment & Plan Adult Wellness Visit   This routine wellness visit included a discussion on current medications and overall health. He is encouraged to exercise regularly, maintain an active lifestyle, follow a balanced diet, reduce alcohol consumption, quit smoking, use seatbelts, avoid drugs, and attend regular dental visits.  Chronic lymphocytic leukemia (CLL)   CLL is well-managed with no significant weight loss despite changes in appetite and  taste. He should continue regular follow-ups at the cancer center.  Alcohol use disorder   He continues to consume alcohol despite understanding its impact on liver function. Naltrexone was discussed as a potential aid to curb cravings, with the caution that it may cause nausea if alcohol is consumed and cannot be taken with narcotics. Consider starting naltrexone and advise abstinence from alcohol.  Depression   Chronic depression may be exacerbated by recent family stressors, though no suicidal ideation is reported. He is currently on Wellbutrin and clonazepam , though clonazepam  is not taken regularly due to side effects.  Urinary frequency after prostate surgery   He experiences persistent urinary frequency post-prostate surgery, leading to fatigue. Trazodone  may aid sleep but may not address the urinary issue. Follow up with urology for persistent urinary frequency and consider a trial of trazodone  for sleep.  Right knee osteoarthritis with swelling   Right knee osteoarthritis is accompanied by swelling and pain, worsened by recent falls. Previous imaging showed arthritis but no acute injury. Consider referral to orthopedics for possible knee injections, including cortisone and hyaluronic acid.  Hypertension   Blood pressure is generally well-controlled with amlodipine , though a recent elevated reading is considered  an outlier. Continue amlodipine  5 mg daily and monitor blood pressure regularly.  Pure hypercholesterolemia   Cholesterol is managed with rosuvastatin  and Zetia . Check cholesterol levels today, as the last check was a year ago.  Tremor   He experiences mild tremors, possibly related to nerves or other underlying conditions.  Aneurysm-chronic.  Stable.  Managed by card  Recommended follow up: Return in about 1 year (around 03/14/2025) for annual physical.  Lab/Order associations:+ fasting  Jenkins CHRISTELLA Carrel, MD

## 2024-03-15 DIAGNOSIS — L814 Other melanin hyperpigmentation: Secondary | ICD-10-CM | POA: Diagnosis not present

## 2024-03-15 DIAGNOSIS — D485 Neoplasm of uncertain behavior of skin: Secondary | ICD-10-CM | POA: Diagnosis not present

## 2024-03-15 DIAGNOSIS — D229 Melanocytic nevi, unspecified: Secondary | ICD-10-CM | POA: Diagnosis not present

## 2024-03-15 DIAGNOSIS — L570 Actinic keratosis: Secondary | ICD-10-CM | POA: Diagnosis not present

## 2024-03-15 DIAGNOSIS — L821 Other seborrheic keratosis: Secondary | ICD-10-CM | POA: Diagnosis not present

## 2024-03-15 DIAGNOSIS — D224 Melanocytic nevi of scalp and neck: Secondary | ICD-10-CM | POA: Diagnosis not present

## 2024-03-15 DIAGNOSIS — L578 Other skin changes due to chronic exposure to nonionizing radiation: Secondary | ICD-10-CM | POA: Diagnosis not present

## 2024-03-16 DIAGNOSIS — K080 Exfoliation of teeth due to systemic causes: Secondary | ICD-10-CM | POA: Diagnosis not present

## 2024-03-17 ENCOUNTER — Encounter: Payer: Self-pay | Admitting: Family Medicine

## 2024-03-20 ENCOUNTER — Ambulatory Visit (INDEPENDENT_AMBULATORY_CARE_PROVIDER_SITE_OTHER): Admitting: Psychology

## 2024-03-20 ENCOUNTER — Other Ambulatory Visit: Payer: Self-pay | Admitting: Physician Assistant

## 2024-03-20 DIAGNOSIS — F410 Panic disorder [episodic paroxysmal anxiety] without agoraphobia: Secondary | ICD-10-CM

## 2024-03-20 DIAGNOSIS — F329 Major depressive disorder, single episode, unspecified: Secondary | ICD-10-CM

## 2024-03-20 DIAGNOSIS — F4321 Adjustment disorder with depressed mood: Secondary | ICD-10-CM

## 2024-03-20 DIAGNOSIS — F331 Major depressive disorder, recurrent, moderate: Secondary | ICD-10-CM

## 2024-03-20 MED ORDER — NALTREXONE HCL 50 MG PO TABS: 50.0000 mg | ORAL_TABLET | Freq: Every day | ORAL | 0 refills | Status: AC

## 2024-03-20 NOTE — Progress Notes (Signed)
 [facility] Counselor Initial Adult Exam  Name: Eugene Guzman. Date: 03/20/2024 MRN: [id number] DOB: 09/08/1955 PCP: Wendolyn Jenkins Jansky, MD  Time spent: 60 minutes  Guardian/Payee:  N/A    Paperwork requested: No   Reason for Visit /Presenting Problem: Symptoms of Depression, Grief and Panic   Mental Status Exam: Appearance:   Casual     Behavior:  Appropriate  Motor:  Normal  Speech/Language:   Clear and Coherent  Affect:  Appropriate  Mood:  depressed  Thought process:  normal  Thought content:    WNL  Sensory/Perceptual disturbances:    WNL  Orientation:  oriented to person, place, and situation  Attention:  Good  Concentration:  Good  Memory:  WNL  Fund of knowledge:   Good  Insight:    Good  Judgment:   Good  Impulse Control:  Good    Reported Symptoms:  sadness, powerlessness, anxiety/panic  Risk Assessment: Danger to Self:  No Self-injurious Behavior: No Danger to Others: No Duty to Warn:no Physical Aggression / Violence:No  Access to Firearms a concern: No  Gang Involvement:No  Patient / guardian was educated about steps to take if suicide or homicide risk level increases between visits: n/a While future psychiatric events cannot be accurately predicted, the patient does not currently require acute inpatient psychiatric care and does not currently meet [place]  involuntary commitment criteria.  Substance Abuse History: Current substance abuse: No     Past Psychiatric History:   Previous psychological history is significant for depression and panic Outpatient  Providers:Lisa Polis History of Psych Hospitalization: No  Psychological Testing: N/A   Abuse History:  Victim of: No., N/A   Report needed: No. Victim of Neglect:No. Perpetrator of N/A  Witness / Exposure to Domestic Violence: No   Protective Services Involvement: No  Witness to [employer] Violence:  No   Family History:  Family History  Problem Relation Age of Onset   Hypertension Mother    Cancer Mother    Hearing loss Mother    Heart attack Father 50   Arthritis Father    Depression Father    Heart disease Father    Cancer Sister    Hearing loss Sister    Depression Paternal Uncle    Depression Paternal Uncle    Ovarian cancer Maternal Grandmother    Cancer Maternal Grandmother    Hearing loss Maternal Grandfather  Heart disease Maternal Grandfather    Hypertension Maternal Grandfather    Stroke Maternal Grandfather    Colon cancer Paternal Grandfather 76   Cancer Paternal Grandfather    Esophageal cancer Neg Hx    Rectal cancer Neg Hx    Stomach cancer Neg Hx     Living situation: the patient lives with their spouse  Sexual Orientation: Straight  Relationship Status: married  Name of spouse / other:unknown If a parent, number of children / ages:none  Support Systems: N/A  Surveyor, quantity Stress:  No   Income/Employment/Disability: Neurosurgeon: unknown  Educational History: Education: Risk manager: unknown  Any cultural differences that may affect / interfere with treatment:  not applicable   Recreation/Hobbies: unknown  Stressors: Loss of mother    Strengths: Supportive Relationships and Able to Communicate Effectively  Barriers:  unknown   Legal History: Pending legal issue / charges: The patient has no significant history of legal issues. History of legal issue / charges: N/A  Medical History/Surgical History: reviewed Past Medical History:  Diagnosis Date   Allergy     childhood - penicillin, 2014 - latex   Anxiety 1990   Aortic aneurysm (HCC)    Arthritis 2014   right shoulder   CLL (chronic lymphocytic leukemia) (HCC)    Coronary artery calcification seen on CT scan    Depressed    Enlarged prostate    Hx of adenomatous polyp of colon 02/2018   Dr. Milon   Hyperlipidemia    Hypertension 2021   Sleep apnea 2022   Have stopped using CPAP    Past Surgical History:  Procedure Laterality Date   CORONARY PRESSURE/FFR STUDY N/A 04/28/2022   Procedure: INTRAVASCULAR PRESSURE WIRE/FFR STUDY;  Surgeon: Anner Alm ORN, MD;  Location: [facility] INVASIVE CV LAB;  Service: Cardiovascular;  Laterality: N/A;   LEFT HEART CATH AND CORONARY ANGIOGRAPHY N/A 04/28/2022   Procedure: LEFT HEART CATH AND CORONARY ANGIOGRAPHY;  Surgeon: Anner Alm ORN, MD;  Location: [facility] INVASIVE CV LAB;  Service: Cardiovascular;  Laterality: N/A;   TONSILLECTOMY  1961   TRANSURETHRAL RESECTION OF PROSTATE N/A 03/19/2023   Procedure: TRANSURETHRAL RESECTION OF THE PROSTATE (TURP);  Surgeon: Alvaro Ricardo KATHEE Mickey., MD;  Location: [facility];  Service: Urology;  Laterality: N/A;    Medications: Current Outpatient Medications  Medication Sig Dispense Refill   amLODipine  (NORVASC ) 5 MG tablet Take 1 tablet (5 mg total) by mouth daily. 90 tablet 3   aspirin  EC 81 MG tablet Take 1 tablet (81 mg total) by mouth daily. Swallow whole. 90 tablet 3   B COMPLEX VITAMINS PO Take 1 capsule by mouth daily. 100 mg daily     celecoxib (CELEBREX) 200 MG capsule Take 200 mg by mouth daily. (Patient taking differently: Take 200 mg by mouth daily.)     Cholecalciferol (VITAMIN D3) 50 MCG (2000 UT) CAPS Take 1 capsule by mouth daily.     clonazePAM  (KLONOPIN ) 0.5 MG tablet Take 0.5 mg by mouth 4 (four) times daily as needed for anxiety.     ezetimibe  (ZETIA ) 10 MG tablet Take 1 tablet (10 mg total) by mouth daily. 30 tablet 5   fluvoxaMINE  (LUVOX ) 100 MG tablet Take 200 mg by mouth at bedtime.      lamoTRIgine  (LAMICTAL ) 200 MG tablet Take 200 mg by mouth at bedtime.     multivitamin (ONE-A-DAY MEN'S) TABS tablet Take 1 tablet by mouth at bedtime.     PREVIDENT 5000  BOOSTER PLUS 1.1 % PSTE Place 1 Application onto teeth daily.     rosuvastatin  (CRESTOR ) 20 MG tablet Take 1 tablet (20 mg total) by mouth daily. 90 tablet 3   traZODone  (DESYREL ) 50 MG tablet Take 0.5-1 tablets (25-50 mg total) by mouth at bedtime as needed for sleep. (Patient not taking: Reported on 03/14/2024) 30 tablet 5   WELLBUTRIN XL 150 MG 24 hr tablet Take 150 mg by mouth daily.     No current facility-administered medications for this visit.    Allergies  Allergen Reactions   Latex Rash   Levofloxacin Other (See Comments)    Avoid fluoroquinolone antibiotics due to thoracic aortic aneurysm   Penicillins Other (See Comments)    Childhood   Quinolones Other (See Comments)    Patient with ATAA (ascending thoracic aortic aneurysm)    Diagnoses:  Major Depression, Panic, Grief  Plan of Care: Outpatient Psychotherapy and medication management  Initial Note: He states he has taken psychotropic medication for years and sees Olam Holly at [facility]. States [place] and feels he has suffered depression most of his life. He only took vacation once a year and would get really depressed when it was over. First significant depression was at 33 when he had a break-up. Had more minor depressive episodes earlier in life. Change has always been difficult for him. Also has had severe panic attacks. First one he remembers was first day of his fifth year of college. He was distressed that this was the end of his college career. He recalls having a panic when on business trip to [place] in a [employer]. He had to come home. 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 He was offered early retirement and had a panic when he realized that he just quit a job of 30 years and his  life was changing. When he has panic, he doesn't have a fear of death, rather it is a fear of something he can't control. His anxiety was less prevalent in his life than the depression.  He says my major problem in life is depression and panic. His mother passed and he is struggling with her loss. She died 2 weeks ago at 5 and was a vibrant person for 92 years. In 1 year, she had rapid dementia. He says, she was his best friend. He has a small farm down the road from where his mother lived. He says I don't handle death well. His reason to seek therapy was before mother was deteriorating. He had tried therapy in the past and it was not helpful. Also, he has had medical challenges himself, but says it does not cause him distress. He has been on a number of different anti-depressants over the years.  He contacted me for his depression, ut says the grief is now most prevalent.  He has 1 sister in [place]. She is 3 years older and they have a good relationship, but she has kids and grandchildren and they keep her busy. He married at 17 and wife was 89. His wife will occasionally tell him she blames him for not having kids. He told her when they married that he was too old to have kids. He says it was his inability to handle change. This is his only marriage for both he and his wife. Wife is from [place]  and has 3 sisters and one brother. None of her family in this  area. He left [employer] at 52 (too early). 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. Wife retired in 2013.  Will complete intake at next visit. Patient states he wants to continue treatment and prefers face to face when possible.   Goals/Tx. Plan: Patient states that he is seeking symptom relief from life long depressive episodes. First, however, he needs support and strategies to manage grief over the recent (2 weeks) loss of his mother. Will engage in grief counseling to reduce current despair. In addition, treatment will  address his anxiety that contributes to episodic panic attacks. This will involve relaxation techniques and other behavioral strategies  to facilitate greater control over his anxiety. Patient agrees to plan with a goal date of 12-25.   Patient agrees to a video Public affairs consultant) session. He is at home and provider is in home office.  Session note: Patient states that he is still agitated that his brother in law low balled the price of his mother's home and they are going to flip it. He is now mostly caught up with regard to mother's paperwork. Reports he is still having medical symptoms. Appetite has changed and is variable. Has talked with oncologist about this condition. The CLL creates fatigue. He says he has improved with beer consumption, but still has episodes of drinking too much. Suggestion was to talk with AA friend to get some information.       CONI ALM KERNS, PhD 11:40a-12:30p 50 minutes

## 2024-03-24 ENCOUNTER — Encounter: Payer: Self-pay | Admitting: Family Medicine

## 2024-03-24 ENCOUNTER — Encounter: Admitting: Family Medicine

## 2024-03-24 ENCOUNTER — Other Ambulatory Visit: Payer: Self-pay | Admitting: *Deleted

## 2024-03-24 DIAGNOSIS — H9193 Unspecified hearing loss, bilateral: Secondary | ICD-10-CM

## 2024-03-24 DIAGNOSIS — Z7189 Other specified counseling: Secondary | ICD-10-CM

## 2024-04-03 ENCOUNTER — Ambulatory Visit (INDEPENDENT_AMBULATORY_CARE_PROVIDER_SITE_OTHER): Admitting: Psychology

## 2024-04-03 DIAGNOSIS — F329 Major depressive disorder, single episode, unspecified: Secondary | ICD-10-CM

## 2024-04-03 DIAGNOSIS — F410 Panic disorder [episodic paroxysmal anxiety] without agoraphobia: Secondary | ICD-10-CM | POA: Diagnosis not present

## 2024-04-03 DIAGNOSIS — F4321 Adjustment disorder with depressed mood: Secondary | ICD-10-CM | POA: Diagnosis not present

## 2024-04-03 DIAGNOSIS — F331 Major depressive disorder, recurrent, moderate: Secondary | ICD-10-CM

## 2024-04-03 NOTE — Progress Notes (Addendum)
 [facility] Counselor Initial Adult Exam  Name: Eugene Guzman. Date: 04/03/2024 MRN: [id number] DOB: Oct 13, 1955 PCP: Wendolyn Jenkins Jansky, MD    Guardian/Payee:  N/A    Paperwork requested: No   Reason for Visit /Presenting Problem: Symptoms of Depression, Grief and Panic   Mental Status Exam: Appearance:   Casual     Behavior:  Appropriate  Motor:  Normal  Speech/Language:   Clear and Coherent  Affect:  Appropriate  Mood:  depressed  Thought process:  normal  Thought content:    WNL  Sensory/Perceptual disturbances:    WNL  Orientation:  oriented to person, place, and situation  Attention:  Good  Concentration:  Good  Memory:  WNL  Fund of knowledge:   Good  Insight:    Good  Judgment:   Good  Impulse Control:  Good    Reported Symptoms:  sadness, powerlessness, anxiety/panic  Risk Assessment: Danger to Self:  No Self-injurious Behavior: No Danger to Others: No Duty to Warn:no Physical Aggression / Violence:No  Access to Firearms a concern: No  Gang Involvement:No  Patient / guardian was educated about steps to take if suicide or homicide risk level increases between visits: n/a While future psychiatric events cannot be accurately predicted, the patient does not currently require acute inpatient psychiatric care and does not currently meet [facility]  involuntary commitment criteria.  Substance Abuse History: Current substance abuse: No     Past Psychiatric History:   Previous psychological history is significant for depression and  panic Outpatient Providers:Lisa Polis History of Psych Hospitalization: No  Psychological Testing: N/A   Abuse History:  Victim of: No., N/A   Report needed: No. Victim of Neglect:No. Perpetrator of N/A  Witness / Exposure to Domestic Violence: No   Protective Services Involvement: No  Witness to [employer] Violence:  No   Family History:  Family History  Problem Relation Age of Onset   Hypertension Mother    Cancer Mother    Hearing loss Mother    Heart attack Father 50   Arthritis Father    Depression Father    Heart disease Father    Cancer Sister    Hearing loss Sister    Depression Paternal Uncle    Depression Paternal Uncle    Ovarian cancer Maternal Grandmother  Cancer Maternal Grandmother    Hearing loss Maternal Grandfather    Heart disease Maternal Grandfather    Hypertension Maternal Grandfather    Stroke Maternal Grandfather    Colon cancer Paternal Grandfather 53   Cancer Paternal Grandfather    Esophageal cancer Neg Hx    Rectal cancer Neg Hx    Stomach cancer Neg Hx     Living situation: the patient lives with their spouse  Sexual Orientation: Straight  Relationship Status: married  Name of spouse / other:unknown If a parent, number of children / ages:none  Support Systems: N/A  Surveyor, quantity Stress:  No   Income/Employment/Disability: Neurosurgeon: unknown  Educational History: Education: Risk manager: unknown  Any cultural differences that may affect / interfere with treatment:  not applicable   Recreation/Hobbies: unknown  Stressors: Loss of mother    Strengths: Supportive Relationships and Able to Communicate Effectively  Barriers:  unknown   Legal History: Pending legal issue / charges: The patient has no significant history of legal issues. History of legal issue / charges: N/A  Medical History/Surgical History: reviewed Past Medical History:  Diagnosis  Date   Allergy    childhood - penicillin, 2014 - latex   Anxiety 1990   Aortic aneurysm    Arthritis 2014   right shoulder   CLL (chronic lymphocytic leukemia) (HCC)    Coronary artery calcification seen on CT scan    Depressed    Enlarged prostate    Hx of adenomatous polyp of colon 02/2018   Dr. Milon   Hyperlipidemia    Hypertension 2021   Sleep apnea 2022   Have stopped using CPAP    Past Surgical History:  Procedure Laterality Date   CORONARY PRESSURE/FFR STUDY N/A 04/28/2022   Procedure: INTRAVASCULAR PRESSURE WIRE/FFR STUDY;  Surgeon: Anner Alm ORN, MD;  Location: [facility] INVASIVE CV LAB;  Service: Cardiovascular;  Laterality: N/A;   LEFT HEART CATH AND CORONARY ANGIOGRAPHY N/A 04/28/2022   Procedure: LEFT HEART CATH AND CORONARY ANGIOGRAPHY;  Surgeon: Anner Alm ORN, MD;  Location: [facility] INVASIVE CV LAB;  Service: Cardiovascular;  Laterality: N/A;   TONSILLECTOMY  1961   TRANSURETHRAL RESECTION OF PROSTATE N/A 03/19/2023   Procedure: TRANSURETHRAL RESECTION OF THE PROSTATE (TURP);  Surgeon: Alvaro Ricardo KATHEE Mickey., MD;  Location: [facility] ORS;  Service: Urology;  Laterality: N/A;    Medications: Current Outpatient Medications  Medication Sig Dispense Refill   amLODipine  (NORVASC ) 5 MG tablet Take 1 tablet (5 mg total) by mouth daily. 90 tablet 3   aspirin  EC 81 MG tablet Take 1 tablet (81 mg total) by mouth daily. Swallow whole. 90 tablet 3   B COMPLEX VITAMINS PO Take 1 capsule by mouth daily. 100 mg daily     celecoxib (CELEBREX) 200 MG capsule Take 200 mg by mouth daily. (Patient taking differently: Take 200 mg by mouth daily.)     Cholecalciferol (VITAMIN D3) 50 MCG (2000 UT) CAPS Take 1 capsule by mouth daily.     clonazePAM  (KLONOPIN ) 0.5 MG tablet Take 0.5 mg by mouth 4 (four) times daily as needed for anxiety.     ezetimibe  (ZETIA ) 10 MG tablet Take 1 tablet (10 mg total) by mouth daily. 30 tablet 5   fluvoxaMINE  (LUVOX ) 100 MG tablet Take 200 mg by mouth at bedtime.      lamoTRIgine  (LAMICTAL ) 200 MG tablet Take 200 mg by mouth at bedtime.     multivitamin (ONE-A-DAY MEN'S) TABS tablet Take  1 tablet by mouth at bedtime.     naltrexone  (DEPADE) 50 MG tablet Take 1 tablet (50 mg total) by mouth daily. 30 tablet 0   PREVIDENT 5000 BOOSTER PLUS 1.1 % PSTE Place 1 Application onto teeth daily.     rosuvastatin  (CRESTOR ) 20 MG tablet Take 1 tablet (20 mg total) by mouth daily. 90 tablet 3   traZODone  (DESYREL ) 50 MG tablet Take 0.5-1 tablets (25-50 mg total) by mouth at bedtime as needed for sleep. (Patient not taking: Reported on 03/14/2024) 30 tablet 5   WELLBUTRIN XL 150 MG 24 hr tablet Take 150 mg by mouth daily.     No current facility-administered medications for this visit.    Allergies  Allergen Reactions   Latex Rash   Levofloxacin Other (See Comments)    Avoid fluoroquinolone antibiotics due to thoracic aortic aneurysm   Penicillins Other (See Comments)    Childhood   Quinolones Other (See Comments)    Patient with ATAA (ascending thoracic aortic aneurysm)    Diagnoses:  Major Depression, Panic, Grief  Plan of Care: Outpatient Psychotherapy and medication management  Initial Note: He states he has taken psychotropic medication for years and sees Olam Holly at [facility]. States [place] and feels he has suffered depression most of his life. He only took vacation once a year and would get really depressed when it was over. First significant depression was at 33 when he had a break-up. Had more minor depressive episodes earlier in life. Change has always been difficult for him. Also has had severe panic attacks. First one he remembers was first day of his fifth year of college. He was distressed that this was the end of his college career. He recalls having a panic when on business trip to [place] in a [employer]. He had to come home. 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 He was offered  early retirement and had a panic when he realized that he just quit a job of 30 years and his life was changing. When he has panic, he doesn't have a fear of death, rather it is a fear of something he can't control. His anxiety was less prevalent in his life than the depression.  He says my major problem in life is depression and panic. His mother passed and he is struggling with her loss. She died 2 weeks ago at 52 and was a vibrant person for 92 years. In 1 year, she had rapid dementia. He says, she was his best friend. He has a small farm down the road from where his mother lived. He says I don't handle death well. His reason to seek therapy was before mother was deteriorating. He had tried therapy in the past and it was not helpful. Also, he has had medical challenges himself, but says it does not cause him distress. He has been on a number of different anti-depressants over the years.  He contacted me for his depression, ut says the grief is now most prevalent.  He has 1 sister in [place]. She is 3 years older and they have a good relationship, but she has kids and grandchildren and they keep her busy. He married at 47 and wife was 57. His wife will occasionally tell him she blames him for not having kids. He told her when they married that he was too old to have kids. He says it was his inability  to handle change. This is his only marriage for both he and his wife. Wife is from [place]  and has 3 sisters and one brother. None of her family in this area. He left [employer] at 52 (too early). 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. Wife retired in 2013.  Will complete intake at next visit. Patient states he wants to continue treatment and prefers face to face when possible.   Goals/Tx. Plan: Patient states that he is seeking symptom relief from life long depressive episodes. First, however, he needs support and strategies to manage grief over the recent (2 weeks) loss of his  mother. Will engage in grief counseling to reduce current despair. In addition, treatment will address his anxiety that contributes to episodic panic attacks. This will involve relaxation techniques and other behavioral strategies  to facilitate greater control over his anxiety. Patient agrees to plan with a goal date of 12-25.   Patient agrees to a video Public affairs consultant) session. He is at home and provider is in home office.  Session note: Patient states that he and his wife both got sick this week. Also, he is not sleeping well and having distressing dreams. Is trying to work on improving sleep patterns. Is fatigued all day and therefore not productive. He says that drinking is still his biggest challenge. He was doing well, but then has episodes and will drink too much. Yesterday, after seeing realtor sign in front of mother's house, he got upset and drank too much beer. He says AA is still on the table if he is unable to stop his drinking. Discussed circumstances that would contribute to decision to attend a meeting and what to expect.          CONI ALM KERNS, PhD 11:40a-12:30p 50 minutes

## 2024-04-04 ENCOUNTER — Other Ambulatory Visit: Payer: Self-pay | Admitting: Internal Medicine

## 2024-04-05 DIAGNOSIS — H903 Sensorineural hearing loss, bilateral: Secondary | ICD-10-CM | POA: Diagnosis not present

## 2024-04-09 ENCOUNTER — Other Ambulatory Visit: Payer: Self-pay | Admitting: Gastroenterology

## 2024-04-11 ENCOUNTER — Ambulatory Visit (INDEPENDENT_AMBULATORY_CARE_PROVIDER_SITE_OTHER): Admitting: Otolaryngology

## 2024-04-11 ENCOUNTER — Encounter (INDEPENDENT_AMBULATORY_CARE_PROVIDER_SITE_OTHER): Payer: Self-pay | Admitting: Otolaryngology

## 2024-04-11 VITALS — BP 144/79 | HR 57 | Ht 69.0 in | Wt 165.0 lb

## 2024-04-11 DIAGNOSIS — H903 Sensorineural hearing loss, bilateral: Secondary | ICD-10-CM | POA: Diagnosis not present

## 2024-04-11 DIAGNOSIS — H9313 Tinnitus, bilateral: Secondary | ICD-10-CM
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
## 2024-04-11 NOTE — Patient Instructions (Signed)
 Lipoflavonoids

## 2024-04-23 DIAGNOSIS — R319 Hematuria, unspecified: Secondary | ICD-10-CM | POA: Diagnosis not present

## 2024-04-23 DIAGNOSIS — N390 Urinary tract infection, site not specified: Secondary | ICD-10-CM | POA: Diagnosis not present

## 2024-04-23 DIAGNOSIS — N401 Enlarged prostate with lower urinary tract symptoms: Secondary | ICD-10-CM | POA: Diagnosis not present

## 2024-04-23 DIAGNOSIS — R300 Dysuria: Secondary | ICD-10-CM | POA: Diagnosis not present

## 2024-04-25 ENCOUNTER — Other Ambulatory Visit

## 2024-04-25 ENCOUNTER — Ambulatory Visit: Admitting: Physician Assistant

## 2024-04-25 ENCOUNTER — Encounter: Payer: Self-pay | Admitting: Physician Assistant

## 2024-04-25 VITALS — BP 104/70 | HR 69 | Ht 70.0 in | Wt 173.0 lb

## 2024-04-25 DIAGNOSIS — F1090 Alcohol use, unspecified, uncomplicated: Secondary | ICD-10-CM

## 2024-04-25 DIAGNOSIS — K760 Fatty (change of) liver, not elsewhere classified: Secondary | ICD-10-CM

## 2024-04-25 DIAGNOSIS — Z860100 Personal history of colon polyps, unspecified: Secondary | ICD-10-CM

## 2024-04-25 DIAGNOSIS — K219 Gastro-esophageal reflux disease without esophagitis: Secondary | ICD-10-CM

## 2024-04-25 DIAGNOSIS — R7989 Other specified abnormal findings of blood chemistry: Secondary | ICD-10-CM

## 2024-04-25 DIAGNOSIS — Z860102 Personal history of hyperplastic colon polyps: Secondary | ICD-10-CM | POA: Diagnosis not present

## 2024-04-25 LAB — HEPATIC FUNCTION PANEL
ALT: 39 U/L (ref 0–53)
AST: 37 U/L (ref 0–37)
Albumin: 4.6 g/dL (ref 3.5–5.2)
Alkaline Phosphatase: 98 U/L (ref 39–117)
Bilirubin, Direct: 0.2 mg/dL (ref 0.0–0.3)
Total Bilirubin: 0.4 mg/dL (ref 0.2–1.2)
Total Protein: 7.1 g/dL (ref 6.0–8.3)

## 2024-04-25 NOTE — Progress Notes (Signed)
 Ellouise Console, PA-C [address] [place], [place]  [address] Phone: [phone]   Primary Care Physician: Wendolyn Jenkins Jansky, MD  Primary Gastroenterologist:  Ellouise Console, PA-C / Elspeth Naval, MD   Chief Complaint:  Follow-up elevated LFTs, fatty liver, gastritis, GERD with esophagitis.   HPI:   Discussed the use of AI scribe software for clinical note transcription with the patient, who gave verbal consent to proceed. History of Present Illness Eugene Guzman. Will is a 68 year old male who presents for follow-up after upper endoscopy and colonoscopy.  He underwent an upper endoscopy and colonoscopy in July 2025. The upper endoscopy revealed mild esophagitis, mild gastritis, and mild inflammation in the duodenum. Biopsies from the stomach were negative for H. pylori. The colonoscopy identified two small benign polyps. He has experienced no significant stomach pain since the procedures and is currently taking omeprazole  20 mg daily.  He describes a sensation of a 'golf ball in my throat' over the past month, known as globus sensation, which occurs when swallowing. He reports that the sensation can occur even after drinking ginger ale and is not necessarily related to eating. There is no feeling of food getting stuck.  He has a history of chronic lymphocytic leukemia (CLL), which has affected his appetite, though he reports improvement in his eating habits recently. He also has a history of elevated liver enzymes, with AST and ALT levels in July 2025. A liver ultrasound in October 2024 and a CT scan in February 2025 showed mild diffuse fatty liver but no cirrhosis or liver masses.  He recently stopped alcohol consumption about a month ago, citing social challenges but no cravings. He is managing depression with medication prescribed by a mental health specialist and has tried talk therapy without finding it beneficial.  01/19/2024 EGD: By Dr. Naval: LA grade  a esophagitis, mild gastritis and duodenitis.  Biopsies negative for H. pylori.  01/19/2024 colonoscopy performed: 1 small 3 mm hyperplastic polyp removed from transverse colon.  Large post polypectomy scar at the hepatic flexure with a 3 mm hyperplastic residual flat polyp tissue.  Pandiverticulosis.  Good prep.  Internal hemorrhoids.  3-year repeat (due 01/2027).   08/18/2023 CT abdomen pelvis with contrast: 1. No acute inflammatory process identified within the abdomen or pelvis.  Mild diffuse hepatic steatosis.  No cirrhosis or liver masses. 2. No nephroureterolithiasis or obstructive uropathy. 3. There are small bilateral simple renal cysts. 4. Other nonacute observations, as described above. 5.  No gallstones.  Normal pancreas.   04/2023 RUQ US : Hepatic steatosis.  No cirrhosis or liver masses. No gallstones.   05/2023 colonoscopy by Dr. Naval:  3 polyps removed, 1 of which was large, roughly 3 cm removed in piecemeal from the hepatic flexure, consistent with sessile serrated polyp. Tattoo placed. Repeat exam in 6 to 12 months.    Colonoscopy 02/28/2018 - Dr. Luis - 5 polyps 3-6 mm in size, diverticulosis - adenomas and hyperplastic polyps - can't determine how much of each   Colonoscopy 12/2012 - Dr. Luis - sigmoid 9mm polyp, hemorrhoids - adenoma   Negative hep B and hep C studies in April 2024 ANA negative 02/2022   PMH: Anxiety, elevated liver enzymes, fatty liver, history of alcohol use.  History of CLL (followed by oncology), colon polyps.   Current Outpatient Medications  Medication Sig Dispense Refill   amLODipine  (NORVASC ) 5 MG tablet Take 1 tablet (5 mg total) by mouth daily. 90 tablet 3  aspirin  EC 81 MG tablet Take 1 tablet (81 mg total) by mouth daily. Swallow whole. 90 tablet 3   B COMPLEX VITAMINS PO Take 1 capsule by mouth daily. 100 mg daily     celecoxib (CELEBREX) 200 MG capsule Take 200 mg by mouth daily. (Patient taking differently: Take 200 mg by mouth  daily.)     Cholecalciferol (VITAMIN D3) 50 MCG (2000 UT) CAPS Take 1 capsule by mouth daily.     clonazePAM  (KLONOPIN ) 0.5 MG tablet Take 0.5 mg by mouth 4 (four) times daily as needed for anxiety.     ezetimibe  (ZETIA ) 10 MG tablet Take 1 tablet (10 mg total) by mouth daily. 90 tablet 2   fluvoxaMINE  (LUVOX ) 100 MG tablet Take 200 mg by mouth at bedtime.     lamoTRIgine  (LAMICTAL ) 200 MG tablet Take 200 mg by mouth at bedtime.     multivitamin (ONE-A-DAY MEN'S) TABS tablet Take 1 tablet by mouth at bedtime.     naltrexone  (DEPADE) 50 MG tablet Take 1 tablet (50 mg total) by mouth daily. 30 tablet 0   omeprazole  (PRILOSEC) 20 MG capsule Take 1 capsule (20 mg total) by mouth daily as needed. 30 capsule 5   PREVIDENT 5000 BOOSTER PLUS 1.1 % PSTE Place 1 Application onto teeth daily.     rosuvastatin  (CRESTOR ) 20 MG tablet Take 1 tablet (20 mg total) by mouth daily. 90 tablet 3   traZODone  (DESYREL ) 50 MG tablet Take 0.5-1 tablets (25-50 mg total) by mouth at bedtime as needed for sleep. 30 tablet 5   WELLBUTRIN XL 150 MG 24 hr tablet Take 150 mg by mouth daily.     No current facility-administered medications for this visit.    Allergies as of 04/25/2024 - Review Complete 04/25/2024  Allergen Reaction Noted   Latex Rash 04/28/2022   Levofloxacin Other (See Comments) 12/28/2022   Penicillins Other (See Comments) 09/06/2013   Quinolones Other (See Comments) 07/05/2023    Past Medical History:  Diagnosis Date   Allergy    childhood - penicillin, 2014 - latex   Anxiety 1990   Aortic aneurysm    Arthritis 2014   right shoulder   CLL (chronic lymphocytic leukemia) (HCC)    Coronary artery calcification seen on CT scan    Depressed    Enlarged prostate    Hx of adenomatous polyp of colon 02/2018   Dr. Milon   Hyperlipidemia    Hypertension 2021   Sleep apnea 2022   Have stopped using CPAP    Past Surgical History:  Procedure Laterality Date   CORONARY PRESSURE/FFR STUDY N/A  04/28/2022   Procedure: INTRAVASCULAR PRESSURE WIRE/FFR STUDY;  Surgeon: Anner Alm ORN, MD;  Location: [facility] INVASIVE CV LAB;  Service: Cardiovascular;  Laterality: N/A;   LEFT HEART CATH AND CORONARY ANGIOGRAPHY N/A 04/28/2022   Procedure: LEFT HEART CATH AND CORONARY ANGIOGRAPHY;  Surgeon: Anner Alm ORN, MD;  Location: [facility] INVASIVE CV LAB;  Service: Cardiovascular;  Laterality: N/A;   TONSILLECTOMY  1961   TRANSURETHRAL RESECTION OF PROSTATE N/A 03/19/2023   Procedure: TRANSURETHRAL RESECTION OF THE PROSTATE (TURP);  Surgeon: Alvaro Ricardo KATHEE Mickey., MD;  Location: [facility];  Service: Urology;  Laterality: N/A;    Review of Systems:    All systems reviewed and negative except where noted in HPI.    Physical Exam:  BP 104/70   Pulse 69   Ht 5' 10 (1.778 m)   Wt 173 lb (78.5 kg)   BMI 24.82  kg/m  No LMP for male patient.  General: Well-nourished, well-developed in no acute distress.  Neuro: Alert and oriented x 3.  Grossly intact.  Psych: Alert and cooperative, normal mood and affect.   Imaging Studies: No results found.  Labs: CBC    Component Value Date/Time   WBC 13.8 (H) 08/18/2023 0929   RBC 4.92 08/18/2023 0929   HGB 15.3 08/18/2023 0929   HGB 13.6 11/02/2022 1219   HGB 14.4 04/23/2022 1409   HCT 44.8 08/18/2023 0929   HCT 42.1 04/23/2022 1409   PLT 198 08/18/2023 0929   PLT 167 11/02/2022 1219   PLT 189 04/23/2022 1409   MCV 91.1 08/18/2023 0929   MCV 91 04/23/2022 1409   MCH 31.1 08/18/2023 0929   MCHC 34.2 08/18/2023 0929   RDW 13.2 08/18/2023 0929   RDW 12.9 04/23/2022 1409   LYMPHSABS 8.3 (H) 08/18/2023 0929   MONOABS 0.7 08/18/2023 0929   EOSABS 0.1 08/18/2023 0929   BASOSABS 0.1 08/18/2023 0929    CMP     Component Value Date/Time   NA 135 08/18/2023 0929   NA 138 04/23/2022 1409   K 4.2 08/18/2023 0929   CL 100 08/18/2023 0929   CO2 18 (L) 08/18/2023 0929   GLUCOSE 98 08/18/2023 0929   BUN 10 08/18/2023 0929   BUN 15 04/23/2022 1409    CREATININE 0.81 08/18/2023 0929   CREATININE 0.83 11/02/2022 1219   CALCIUM  9.5 08/18/2023 0929   PROT 7.2 01/06/2024 1350   PROT 6.6 01/19/2022 1149   ALBUMIN 4.7 01/06/2024 1350   ALBUMIN 4.6 01/19/2022 1149   AST 83 (H) 01/06/2024 1350   AST 30 11/02/2022 1219   ALT 83 (H) 01/06/2024 1350   ALT 26 11/02/2022 1219   ALKPHOS 92 01/06/2024 1350   BILITOT 0.6 01/06/2024 1350   BILITOT 1.0 11/02/2022 1219   GFRNONAA >60 08/18/2023 0929   GFRNONAA >60 11/02/2022 1219   GFRAA >60 03/04/2015 1924       Assessment and Plan:   Debby Misty Demont Linford. is a 68 y.o. y/o male returns for follow-up of:   Gastroesophageal reflux disease with esophagitis, gastritis, and duodenitis on EGD.  Biopsies negative for H. pylori.   - Increase omeprazole  to 40 mg daily, either as 20 mg twice daily or 40 mg once daily, 30 minutes before a meal. - Consider barium swallow test if globus sensation worsens.  2.  Fatty liver disease with mildly elevated liver transaminases Mildly elevated liver transaminases. Imaging showed mild diffuse fatty liver, no cirrhosis. Lifestyle changes recommended to prevent progression. - Order repeat liver function tests: Hepatic panel - Encourage low-fat diet, exercise, weight loss, and alcohol avoidance.  3.  Alcohol use disorder.  I commended him on his recent cessation of alcohol. Discussed importance of abstinence to prevent cirrhosis. Encouraged support group participation. - Continue abstinence from alcohol. - Recommend support groups such as Alcoholics Anonymous.  4.  History of colon polyps - 3-year repeat surveillance colonoscopy will be due 01/2027.   Ellouise Console, PA-C  Follow up in 1 year to refill medication or sooner if he has recurrent worsening GI symptoms.

## 2024-04-25 NOTE — Patient Instructions (Signed)
 Your provider has requested that you go to the basement level for lab work before leaving today. Press B on the elevator. The lab is located at the first door on the left as you exit the elevator.  Please follow up sooner if symptoms increase or worsen  Due to recent changes in healthcare laws, you may see the results of your imaging and laboratory studies on MyChart before your provider has had a chance to review them.  We understand that in some cases there may be results that are confusing or concerning to you. Not all laboratory results come back in the same time frame and the provider may be waiting for multiple results in order to interpret others.  Please give us  48 hours in order for your provider to thoroughly review all the results before contacting the office for clarification of your results.   Thank you for trusting me with your gastrointestinal care!   Ellouise Console, PA-C _______________________________________________________  If your blood pressure at your visit was 140/90 or greater, please contact your primary care physician to follow up on this.  _______________________________________________________  If you are age 61 or older, your body mass index should be between 23-30. Your Body mass index is 24.82 kg/m. If this is out of the aforementioned range listed, please consider follow up with your Primary Care Provider.  If you are age 24 or younger, your body mass index should be between 19-25. Your Body mass index is 24.82 kg/m. If this is out of the aformentioned range listed, please consider follow up with your Primary Care Provider.   ________________________________________________________  The [facility] providers would like to encourage you to use MYCHART to communicate with providers for non-urgent requests or questions.  Due to long hold times on the telephone, sending your provider a message by [facility] may be a faster and more efficient way to get a response.   Please allow 48 business hours for a response.  Please remember that this is for non-urgent requests.  _______________________________________________________

## 2024-04-25 NOTE — Progress Notes (Signed)
 Agree with assessment and plan as outlined.

## 2024-04-26 ENCOUNTER — Ambulatory Visit: Admitting: Psychology

## 2024-04-26 DIAGNOSIS — F331 Major depressive disorder, recurrent, moderate: Secondary | ICD-10-CM

## 2024-04-26 NOTE — Progress Notes (Signed)
 [facility] Counselor Initial Adult Exam  Name: Eugene Guzman. Date: 04/26/2024 MRN: [id number] DOB: 1956/06/30 PCP: Wendolyn Jenkins Jansky, MD  Time spent: 60 minutes  Guardian/Payee:  N/A    Paperwork requested: No   Reason for Visit /Presenting Problem: Symptoms of Depression, Grief and Panic   Mental Status Exam: Appearance:   Casual     Behavior:  Appropriate  Motor:  Normal  Speech/Language:   Clear and Coherent  Affect:  Appropriate  Mood:  depressed  Thought process:  normal  Thought content:    WNL  Sensory/Perceptual disturbances:    WNL  Orientation:  oriented to person, place, and situation  Attention:  Good  Concentration:  Good  Memory:  WNL  Fund of knowledge:   Good  Insight:    Good  Judgment:   Good  Impulse Control:  Good    Reported Symptoms:  sadness, powerlessness, anxiety/panic  Risk Assessment: Danger to Self:  No Self-injurious Behavior: No Danger to Others: No Duty to Warn:no Physical Aggression / Violence:No  Access to Firearms a concern: No  Gang Involvement:No  Patient / guardian was educated about steps to take if suicide or homicide risk level increases between visits: n/a While future psychiatric events cannot be accurately predicted, the patient does not currently require acute inpatient psychiatric care and does not currently meet [place]  involuntary commitment criteria.  Substance Abuse History: Current substance abuse: No     Past Psychiatric History:   Previous psychological  history is significant for depression and panic Outpatient Providers:Lisa Polis History of Psych Hospitalization: No  Psychological Testing: N/A   Abuse History:  Victim of: No., N/A   Report needed: No. Victim of Neglect:No. Perpetrator of N/A  Witness / Exposure to Domestic Violence: No   Protective Services Involvement: No  Witness to [employer] Violence:  No   Family History:  Family History  Problem Relation Age of Onset   Hypertension Mother    Cancer Mother    Hearing loss Mother    Heart attack Father 12   Arthritis Father    Depression Father    Heart disease Father    Cancer Sister    Hearing loss Sister  Depression Paternal Uncle    Depression Paternal Uncle    Ovarian cancer Maternal Grandmother    Cancer Maternal Grandmother    Hearing loss Maternal Grandfather    Heart disease Maternal Grandfather    Hypertension Maternal Grandfather    Stroke Maternal Grandfather    Colon cancer Paternal Grandfather 33   Cancer Paternal Grandfather    Esophageal cancer Neg Hx    Rectal cancer Neg Hx    Stomach cancer Neg Hx     Living situation: the patient lives with their spouse  Sexual Orientation: Straight  Relationship Status: married  Name of spouse / other:unknown If a parent, number of children / ages:none  Support Systems: N/A  Surveyor, quantity Stress:  No   Income/Employment/Disability: Neurosurgeon: unknown  Educational History: Education: Risk manager: unknown  Any cultural differences that may affect / interfere with treatment:  not applicable   Recreation/Hobbies: unknown  Stressors: Loss of mother    Strengths: Supportive Relationships and Able to Communicate Effectively  Barriers:  unknown   Legal History: Pending legal issue / charges: The patient has no significant history of legal issues. History of legal issue / charges: N/A  Medical History/Surgical History:  reviewed Past Medical History:  Diagnosis Date   Allergy    childhood - penicillin, 2014 - latex   Anxiety 1990   Aortic aneurysm    Arthritis 2014   right shoulder   CLL (chronic lymphocytic leukemia) (HCC)    Coronary artery calcification seen on CT scan    Depressed    Enlarged prostate    Hx of adenomatous polyp of colon 02/2018   Dr. Milon   Hyperlipidemia    Hypertension 2021   Sleep apnea 2022   Have stopped using CPAP    Past Surgical History:  Procedure Laterality Date   CORONARY PRESSURE/FFR STUDY N/A 04/28/2022   Procedure: INTRAVASCULAR PRESSURE WIRE/FFR STUDY;  Surgeon: Anner Alm ORN, MD;  Location: [facility] INVASIVE CV LAB;  Service: Cardiovascular;  Laterality: N/A;   LEFT HEART CATH AND CORONARY ANGIOGRAPHY N/A 04/28/2022   Procedure: LEFT HEART CATH AND CORONARY ANGIOGRAPHY;  Surgeon: Anner Alm ORN, MD;  Location: [facility] INVASIVE CV LAB;  Service: Cardiovascular;  Laterality: N/A;   TONSILLECTOMY  1961   TRANSURETHRAL RESECTION OF PROSTATE N/A 03/19/2023   Procedure: TRANSURETHRAL RESECTION OF THE PROSTATE (TURP);  Surgeon: Alvaro Ricardo KATHEE Mickey., MD;  Location: [facility];  Service: Urology;  Laterality: N/A;    Medications: Current Outpatient Medications  Medication Sig Dispense Refill   amLODipine  (NORVASC ) 5 MG tablet Take 1 tablet (5 mg total) by mouth daily. 90 tablet 3   aspirin  EC 81 MG tablet Take 1 tablet (81 mg total) by mouth daily. Swallow whole. 90 tablet 3   B COMPLEX VITAMINS PO Take 1 capsule by mouth daily. 100 mg daily     celecoxib (CELEBREX) 200 MG capsule Take 200 mg by mouth daily. (Patient taking differently: Take 200 mg by mouth daily.)     Cholecalciferol (VITAMIN D3) 50 MCG (2000 UT) CAPS Take 1 capsule by mouth daily.     clonazePAM  (KLONOPIN ) 0.5 MG tablet Take 0.5 mg by mouth 4 (four) times daily as needed for anxiety.     ezetimibe  (ZETIA ) 10 MG tablet Take 1 tablet (10 mg total) by mouth daily. 90 tablet 2   fluvoxaMINE  (LUVOX ) 100 MG  tablet Take 200 mg by mouth at bedtime.     lamoTRIgine  (LAMICTAL ) 200  MG tablet Take 200 mg by mouth at bedtime.     multivitamin (ONE-A-DAY MEN'S) TABS tablet Take 1 tablet by mouth at bedtime.     naltrexone  (DEPADE) 50 MG tablet Take 1 tablet (50 mg total) by mouth daily. 30 tablet 0   omeprazole  (PRILOSEC) 20 MG capsule Take 1 capsule (20 mg total) by mouth daily as needed. 30 capsule 5   PREVIDENT 5000 BOOSTER PLUS 1.1 % PSTE Place 1 Application onto teeth daily.     rosuvastatin  (CRESTOR ) 20 MG tablet Take 1 tablet (20 mg total) by mouth daily. 90 tablet 3   traZODone  (DESYREL ) 50 MG tablet Take 0.5-1 tablets (25-50 mg total) by mouth at bedtime as needed for sleep. 30 tablet 5   WELLBUTRIN XL 150 MG 24 hr tablet Take 150 mg by mouth daily.     No current facility-administered medications for this visit.    Allergies  Allergen Reactions   Latex Rash   Levofloxacin Other (See Comments)    Avoid fluoroquinolone antibiotics due to thoracic aortic aneurysm   Penicillins Other (See Comments)    Childhood  Other Reaction(s): Unknown   Quinolones Other (See Comments)    Patient with ATAA (ascending thoracic aortic aneurysm)    Diagnoses:  Major Depression, Panic, Grief  Plan of Care: Outpatient Psychotherapy and medication management  Initial Note: He states he has taken psychotropic medication for years and sees Olam Holly at [facility]. States [place] and feels he has suffered depression most of his life. He only took vacation once a year and would get really depressed when it was over. First significant depression was at 33 when he had a break-up. Had more minor depressive episodes earlier in life. Change has always been difficult for him. Also has had severe panic attacks. First one he remembers was first day of his fifth year of college. He was distressed that this was the end of his college career. He recalls having a panic when on business trip to [place]  in a [employer]. He had to come home. 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 He was offered early retirement and had a panic when he realized that he just quit a job of 30 years and his life was changing. When he has panic, he doesn't have a fear of death, rather it is a fear of something he can't control. His anxiety was less prevalent in his life than the depression.  He says my major problem in life is depression and panic. His mother passed and he is struggling with her loss. She died 2 weeks ago at 35 and was a vibrant person for 92 years. In 1 year, she had rapid dementia. He says, she was his best friend. He has a small farm down the road from where his mother lived. He says I don't handle death well. His reason to seek therapy was before mother was deteriorating. He had tried therapy in the past and it was not helpful. Also, he has had medical challenges himself, but says it does not cause him distress. He has been on a number of different anti-depressants over the years.  He contacted me for his depression, ut says the grief is now most prevalent.  He has 1 sister in [place]. She is 3 years older and they have a good relationship, but she has kids and grandchildren and they keep her busy. He married at  25 and wife was 24. His wife will occasionally tell him she blames him for not having kids. He told her when they married that he was too old to have kids. He says it was his inability to handle change. This is his only marriage for both he and his wife. Wife is from [place]  and has 3 sisters and one brother. None of her family in this area. He left [employer] at 52 (too early). 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. Wife retired in 2013.  Will complete intake at next visit. Patient states he wants to continue treatment and prefers face to face when possible.   Goals/Tx. Plan: Patient states that he is seeking symptom relief from life  long depressive episodes. First, however, he needs support and strategies to manage grief over the recent (2 weeks) loss of his mother. Will engage in grief counseling to reduce current despair. In addition, treatment will address his anxiety that contributes to episodic panic attacks. This will involve relaxation techniques and other behavioral strategies  to facilitate greater control over his anxiety. Patient agrees to plan with a goal date of 12-25.   Patient agrees to a session in the provider's office. Session note: Patient states that he just got hearing aids and they are helpful. He is still going to the farm almost daily. This time of year he often gets seasonal affective disorder. He has not had a drop of alcohol in over 3 weeks. This had been a difficult adjustment and he is trying to adapt. He has been taking sleep aids to help him relax rather than drink beer. Most concerned about his social life and we discussed how to best manage those situations. He says his goal is to find a substitute drink to replace all the sugar.               CONI ALM KERNS, PhD 11:40a-12:30p 50 minutes

## 2024-04-27 ENCOUNTER — Other Ambulatory Visit: Payer: Self-pay | Admitting: Internal Medicine

## 2024-04-27 ENCOUNTER — Ambulatory Visit: Payer: Self-pay | Admitting: Physician Assistant

## 2024-05-02 DIAGNOSIS — M1711 Unilateral primary osteoarthritis, right knee: Secondary | ICD-10-CM | POA: Diagnosis not present

## 2024-05-02 DIAGNOSIS — M25512 Pain in left shoulder: Secondary | ICD-10-CM | POA: Diagnosis not present

## 2024-05-02 DIAGNOSIS — M25511 Pain in right shoulder: Secondary | ICD-10-CM | POA: Diagnosis not present

## 2024-05-02 DIAGNOSIS — M25562 Pain in left knee: Secondary | ICD-10-CM | POA: Diagnosis not present

## 2024-05-04 ENCOUNTER — Ambulatory Visit: Admitting: Psychology

## 2024-05-04 DIAGNOSIS — F329 Major depressive disorder, single episode, unspecified: Secondary | ICD-10-CM

## 2024-05-04 DIAGNOSIS — F410 Panic disorder [episodic paroxysmal anxiety] without agoraphobia: Secondary | ICD-10-CM

## 2024-05-04 DIAGNOSIS — F4321 Adjustment disorder with depressed mood: Secondary | ICD-10-CM

## 2024-05-04 DIAGNOSIS — F331 Major depressive disorder, recurrent, moderate: Secondary | ICD-10-CM

## 2024-05-04 NOTE — Progress Notes (Signed)
 [facility] Counselor Initial Adult Exam  Name: Eugene Guzman. Date: 05/04/2024 MRN: [id number] DOB: 1956-04-02 PCP: Wendolyn Jenkins Jansky, MD  Time spent: 60 minutes  Guardian/Payee:  N/A    Paperwork requested: No   Reason for Visit /Presenting Problem: Symptoms of Depression, Grief and Panic   Mental Status Exam: Appearance:   Casual     Behavior:  Appropriate  Motor:  Normal  Speech/Language:   Clear and Coherent  Affect:  Appropriate  Mood:  depressed  Thought process:  normal  Thought content:    WNL  Sensory/Perceptual disturbances:    WNL  Orientation:  oriented to person, place, and situation  Attention:  Good  Concentration:  Good  Memory:  WNL  Fund of knowledge:   Good  Insight:    Good  Judgment:   Good  Impulse Control:  Good    Reported Symptoms:  sadness, powerlessness, anxiety/panic  Risk Assessment: Danger to Self:  No Self-injurious Behavior: No Danger to Others: No Duty to Warn:no Physical Aggression / Violence:No  Access to Firearms a concern: No  Gang Involvement:No  Patient / guardian was educated about steps to take if suicide or homicide risk level increases between visits: n/a While future psychiatric events cannot be accurately predicted, the patient does not currently require acute inpatient psychiatric care and does not currently meet [place]  involuntary commitment criteria.  Substance Abuse History: Current substance abuse: No     Past Psychiatric History:    Previous psychological history is significant for depression and panic Outpatient Providers:Lisa Polis History of Psych Hospitalization: No  Psychological Testing: N/A   Abuse History:  Victim of: No., N/A   Report needed: No. Victim of Neglect:No. Perpetrator of N/A  Witness / Exposure to Domestic Violence: No   Protective Services Involvement: No  Witness to [employer] Violence:  No   Family History:  Family History  Problem Relation Age of Onset   Hypertension Mother    Cancer Mother    Hearing loss Mother    Heart attack Father 30   Arthritis Father    Depression Father    Heart disease  Father    Cancer Sister    Hearing loss Sister    Depression Paternal Uncle    Depression Paternal Uncle    Ovarian cancer Maternal Grandmother    Cancer Maternal Grandmother    Hearing loss Maternal Grandfather    Heart disease Maternal Grandfather    Hypertension Maternal Grandfather    Stroke Maternal Grandfather    Colon cancer Paternal Grandfather 4   Cancer Paternal Grandfather    Esophageal cancer Neg Hx    Rectal cancer Neg Hx    Stomach cancer Neg Hx     Living situation: the patient lives with their spouse  Sexual Orientation: Straight  Relationship Status: married  Name of spouse / other:unknown If a parent, number of children / ages:none  Support Systems: N/A  Surveyor, Quantity Stress:  No   Income/Employment/Disability: Neurosurgeon: unknown  Educational History: Education: Risk Manager: unknown  Any cultural differences that may affect / interfere with treatment:  not applicable   Recreation/Hobbies: unknown  Stressors: Loss of mother    Strengths: Supportive Relationships and Able to Communicate Effectively  Barriers:  unknown   Legal History: Pending legal issue / charges: The patient has no significant history of legal issues. History of legal issue / charges: N/A  Medical  History/Surgical History: reviewed Past Medical History:  Diagnosis Date   Allergy    childhood - penicillin, 2014 - latex   Anxiety 1990   Aortic aneurysm    Arthritis 2014   right shoulder   CLL (chronic lymphocytic leukemia) (HCC)    Coronary artery calcification seen on CT scan    Depressed    Enlarged prostate    Hx of adenomatous polyp of colon 02/2018   Dr. Milon   Hyperlipidemia    Hypertension 2021   Sleep apnea 2022   Have stopped using CPAP    Past Surgical History:  Procedure Laterality Date   CORONARY PRESSURE/FFR STUDY N/A 04/28/2022   Procedure: INTRAVASCULAR PRESSURE WIRE/FFR STUDY;  Surgeon: Anner Alm ORN, MD;  Location: [facility] INVASIVE CV LAB;  Service: Cardiovascular;  Laterality: N/A;   LEFT HEART CATH AND CORONARY ANGIOGRAPHY N/A 04/28/2022   Procedure: LEFT HEART CATH AND CORONARY ANGIOGRAPHY;  Surgeon: Anner Alm ORN, MD;  Location: [facility] INVASIVE CV LAB;  Service: Cardiovascular;  Laterality: N/A;   TONSILLECTOMY  1961   TRANSURETHRAL RESECTION OF PROSTATE N/A 03/19/2023   Procedure: TRANSURETHRAL RESECTION OF THE PROSTATE (TURP);  Surgeon: Alvaro Ricardo KATHEE Mickey., MD;  Location: [facility];  Service: Urology;  Laterality: N/A;    Medications: Current Outpatient Medications  Medication Sig Dispense Refill   amLODipine  (NORVASC ) 5 MG tablet Take 1 tablet (5 mg total) by mouth daily. 90 tablet 3   aspirin  EC 81 MG tablet Take 1 tablet (81 mg total) by mouth daily. Swallow whole. 90 tablet 3   B COMPLEX VITAMINS PO Take 1 capsule by mouth daily. 100 mg daily     celecoxib (CELEBREX) 200 MG capsule Take 200 mg by mouth daily. (Patient taking differently: Take 200 mg by mouth daily.)     Cholecalciferol (VITAMIN D3) 50 MCG (2000 UT) CAPS Take 1 capsule by mouth daily.     clonazePAM  (KLONOPIN ) 0.5 MG tablet Take 0.5 mg by mouth 4 (four) times daily as needed for anxiety.     ezetimibe  (ZETIA ) 10 MG tablet Take 1 tablet (10 mg total) by mouth daily. 90 tablet 2    fluvoxaMINE  (LUVOX ) 100 MG  tablet Take 200 mg by mouth at bedtime.     lamoTRIgine  (LAMICTAL ) 200 MG tablet Take 200 mg by mouth at bedtime.     multivitamin (ONE-A-DAY MEN'S) TABS tablet Take 1 tablet by mouth at bedtime.     naltrexone  (DEPADE) 50 MG tablet Take 1 tablet (50 mg total) by mouth daily. 30 tablet 0   omeprazole  (PRILOSEC) 20 MG capsule Take 1 capsule (20 mg total) by mouth daily as needed. 30 capsule 5   PREVIDENT 5000 BOOSTER PLUS 1.1 % PSTE Place 1 Application onto teeth daily.     rosuvastatin  (CRESTOR ) 20 MG tablet Take 1 tablet (20 mg total) by mouth daily. 90 tablet 2   traZODone  (DESYREL ) 50 MG tablet Take 0.5-1 tablets (25-50 mg total) by mouth at bedtime as needed for sleep. 30 tablet 5   WELLBUTRIN XL 150 MG 24 hr tablet Take 150 mg by mouth daily.     No current facility-administered medications for this visit.    Allergies  Allergen Reactions   Latex Rash   Levofloxacin Other (See Comments)    Avoid fluoroquinolone antibiotics due to thoracic aortic aneurysm   Penicillins Other (See Comments)    Childhood  Other Reaction(s): Unknown   Quinolones Other (See Comments)    Patient with ATAA (ascending thoracic aortic aneurysm)    Diagnoses:  Major Depression, Panic, Grief  Plan of Care: Outpatient Psychotherapy and medication management  Initial Note: He states he has taken psychotropic medication for years and sees Olam Holly at [facility]. States [place] and feels he has suffered depression most of his life. He only took vacation once a year and would get really depressed when it was over. First significant depression was at 33 when he had a break-up. Had more minor depressive episodes earlier in life. Change has always been difficult for him. Also has had severe panic attacks. First one he remembers was first day of his fifth year of college. He was distressed that this was the end of his college career. He recalls having a panic when  on business trip to [place] in a [employer]. He had to come home. 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 He was offered early retirement and had a panic when he realized that he just quit a job of 30 years and his life was changing. When he has panic, he doesn't have a fear of death, rather it is a fear of something he can't control. His anxiety was less prevalent in his life than the depression.  He says my major problem in life is depression and panic. His mother passed and he is struggling with her loss. She died 2 weeks ago at 45 and was a vibrant person for 92 years. In 1 year, she had rapid dementia. He says, she was his best friend. He has a small farm down the road from where his mother lived. He says I don't handle death well. His reason to seek therapy was before mother was deteriorating. He had tried therapy in the past and it was not helpful. Also, he has had medical challenges himself, but says it does not cause him distress. He has been on a number of different anti-depressants over the years.  He contacted me for his depression, ut says the grief is now most prevalent.  He has 1 sister in [place]. She is 3 years older and they have a good  relationship, but she has kids and grandchildren and they keep her busy. He married at 62 and wife was 39. His wife will occasionally tell him she blames him for not having kids. He told her when they married that he was too old to have kids. He says it was his inability to handle change. This is his only marriage for both he and his wife. Wife is from [place]  and has 3 sisters and one brother. None of her family in this area. He left [employer] at 52 (too early). 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. Wife retired in 2013.  Will complete intake at next visit. Patient states he wants to continue treatment and prefers face to face when possible.   Goals/Tx. Plan: Patient states that he is  seeking symptom relief from life long depressive episodes. First, however, he needs support and strategies to manage grief over the recent (2 weeks) loss of his mother. Will engage in grief counseling to reduce current despair. In addition, treatment will address his anxiety that contributes to episodic panic attacks. This will involve relaxation techniques and other behavioral strategies  to facilitate greater control over his anxiety. Patient agrees to plan with a goal date of 12-25.   Patient agrees to a session in the provider's office. Session note: Patient talked about his tremors  which has worsened over time. It is a hereditary condition. He states he fell off the wagon on Saturday, but feels it was a one-off in that he has no interest in drinking again. Discussed best approach to the relapse moving forward. He says he and his wife are looking to go to the beach next week. She is upset with Will about how unavailable he is because he is doing so much paper work. He may be embellishing the amount of work to do. Admits that he procrastinates and that is an issue that contributes to the problem. Seems invested in staying involved with what he characterizes as a mounting amount of paperwork. We  talked about best approach moving forward.                 CONI ALM KERNS, PhD 11:40a-12:30p 50 minutes

## 2024-05-05 ENCOUNTER — Emergency Department (HOSPITAL_COMMUNITY)

## 2024-05-05 ENCOUNTER — Emergency Department (HOSPITAL_COMMUNITY)
Admission: EM | Admit: 2024-05-05 | Discharge: 2024-05-05 | Disposition: A | Discharge status: Home / Self Care | Attending: Emergency Medicine | Admitting: Emergency Medicine

## 2024-05-05 ENCOUNTER — Other Ambulatory Visit: Payer: Self-pay

## 2024-05-05 ENCOUNTER — Telehealth: Payer: Self-pay | Admitting: Internal Medicine

## 2024-05-05 DIAGNOSIS — W19XXXA Unspecified fall, initial encounter: Secondary | ICD-10-CM

## 2024-05-05 DIAGNOSIS — I2510 Atherosclerotic heart disease of native coronary artery without angina pectoris: Secondary | ICD-10-CM | POA: Insufficient documentation

## 2024-05-05 DIAGNOSIS — R220 Localized swelling, mass and lump, head: Secondary | ICD-10-CM | POA: Diagnosis not present

## 2024-05-05 DIAGNOSIS — S01112A Laceration without foreign body of left eyelid and periocular area, initial encounter: Secondary | ICD-10-CM | POA: Diagnosis not present

## 2024-05-05 DIAGNOSIS — Z79899 Other long term (current) drug therapy: Secondary | ICD-10-CM | POA: Diagnosis not present

## 2024-05-05 DIAGNOSIS — J320 Chronic maxillary sinusitis: Secondary | ICD-10-CM | POA: Diagnosis not present

## 2024-05-05 DIAGNOSIS — Z91040 Latex allergy status: Secondary | ICD-10-CM | POA: Insufficient documentation

## 2024-05-05 DIAGNOSIS — Z23 Encounter for immunization: Secondary | ICD-10-CM | POA: Diagnosis not present

## 2024-05-05 DIAGNOSIS — S60012A Contusion of left thumb without damage to nail, initial encounter: Secondary | ICD-10-CM | POA: Diagnosis not present

## 2024-05-05 DIAGNOSIS — I672 Cerebral atherosclerosis: Secondary | ICD-10-CM | POA: Diagnosis not present

## 2024-05-05 DIAGNOSIS — S0990XA Unspecified injury of head, initial encounter: Secondary | ICD-10-CM | POA: Diagnosis not present

## 2024-05-05 DIAGNOSIS — M5031 Other cervical disc degeneration,  high cervical region: Secondary | ICD-10-CM | POA: Diagnosis not present

## 2024-05-05 DIAGNOSIS — Z7982 Long term (current) use of aspirin: Secondary | ICD-10-CM | POA: Diagnosis not present

## 2024-05-05 DIAGNOSIS — S0592XA Unspecified injury of left eye and orbit, initial encounter: Secondary | ICD-10-CM | POA: Diagnosis not present

## 2024-05-05 DIAGNOSIS — M5033 Other cervical disc degeneration, cervicothoracic region: Secondary | ICD-10-CM | POA: Diagnosis not present

## 2024-05-05 DIAGNOSIS — I10 Essential (primary) hypertension: Secondary | ICD-10-CM | POA: Insufficient documentation

## 2024-05-05 DIAGNOSIS — M4802 Spinal stenosis, cervical region: Secondary | ICD-10-CM | POA: Diagnosis not present

## 2024-05-05 DIAGNOSIS — R9082 White matter disease, unspecified: Secondary | ICD-10-CM | POA: Diagnosis not present

## 2024-05-05 DIAGNOSIS — W01198A Fall on same level from slipping, tripping and stumbling with subsequent striking against other object, initial encounter: Secondary | ICD-10-CM | POA: Diagnosis not present

## 2024-05-05 DIAGNOSIS — S199XXA Unspecified injury of neck, initial encounter: Secondary | ICD-10-CM | POA: Diagnosis not present

## 2024-05-05 DIAGNOSIS — S0993XA Unspecified injury of face, initial encounter: Secondary | ICD-10-CM | POA: Diagnosis not present

## 2024-05-05 MED ORDER — ACETAMINOPHEN 325 MG PO TABS
650.0000 mg | ORAL_TABLET | Freq: Once | ORAL | Status: DC
  Filled 2024-05-05: qty 2

## 2024-05-05 MED ORDER — TETANUS-DIPHTH-ACELL PERTUSSIS 5-2-15.5 LF-MCG/0.5 IM SUSP
0.5000 mL | Freq: Once | INTRAMUSCULAR | Status: AC
  Administered 2024-05-05: 0.5 mL via INTRAMUSCULAR
  Filled 2024-05-05: qty 0.5

## 2024-05-05 MED ORDER — ROSUVASTATIN CALCIUM 20 MG PO TABS: 20.0000 mg | ORAL_TABLET | Freq: Every day | ORAL | 2 refills | Status: AC

## 2024-05-05 NOTE — Discharge Instructions (Addendum)
 Please go see Dr. Marcey at [facility]. Please arrive prior to 11AM today.  [address], [place], [place] [address]

## 2024-05-05 NOTE — ED Triage Notes (Signed)
 Pt ambulatory to triage with a LEFT eyelid injury. PT reports that he had a nightmare and woke up when he struck his face on the beside table. PT denies any other complaints.

## 2024-05-05 NOTE — ED Provider Notes (Signed)
 [place] EMERGENCY DEPARTMENT AT [facility] Provider Note   CSN: [id number] Arrival date & time: 05/05/24  9298     Patient presents with: Eugene Guzman. is a 68 y.o. male with PMHx HTN, BPH, HLD, CAD, CLL who presents to ED concerned for head trauma. Patient stating that he woke up from a nightmare and ended up hitting his head on the bedside table. Patient with 2cm laceration on left eyelid. Patient denies, LOC, seizure, blood thinners, headache, vision changes.   Patient also with a bruise on his left thumb, but states that it does not hurt and he is able to move it freely.   Patient unsure of last tetanus dose.   Patient denies any recent acute/infectious symptoms such as fever, nausea, vomiting.    Fall       Prior to Admission medications   Medication Sig Start Date End Date Taking? Authorizing Provider  amLODipine  (NORVASC ) 5 MG tablet Take 1 tablet (5 mg total) by mouth daily. 06/15/23   Okey Vina GAILS, MD  aspirin  EC 81 MG tablet Take 1 tablet (81 mg total) by mouth daily. Swallow whole. 02/10/22   Okey Vina GAILS, MD  B COMPLEX VITAMINS PO Take 1 capsule by mouth daily. 100 mg daily    [provider]  celecoxib (CELEBREX) 200 MG capsule Take 200 mg by mouth daily. Patient taking differently: Take 200 mg by mouth daily. 11/18/23   [provider]  Cholecalciferol (VITAMIN D3) 50 MCG (2000 UT) CAPS Take 1 capsule by mouth daily.    [provider]  clonazePAM  (KLONOPIN ) 0.5 MG tablet Take 0.5 mg by mouth 4 (four) times daily as needed for anxiety.    [provider]  ezetimibe  (ZETIA ) 10 MG tablet Take 1 tablet (10 mg total) by mouth daily. 04/05/24   West, Katlyn D, NP  fluvoxaMINE  (LUVOX ) 100 MG tablet Take 200 mg by mouth at bedtime.    [provider]  lamoTRIgine  (LAMICTAL ) 200 MG tablet Take 200 mg by mouth at bedtime.    [provider]  multivitamin (ONE-A-DAY MEN'S) TABS tablet Take 1  tablet by mouth at bedtime. 12/03/21   [provider]  naltrexone  (DEPADE) 50 MG tablet Take 1 tablet (50 mg total) by mouth daily. 03/20/24   Job Lukes, PA  omeprazole  (PRILOSEC) 20 MG capsule Take 1 capsule (20 mg total) by mouth daily as needed. 04/10/24   Armbruster, Elspeth SQUIBB, MD  PREVIDENT 5000 BOOSTER PLUS 1.1 % PSTE Place 1 Application onto teeth daily. 11/18/22   [provider]  rosuvastatin  (CRESTOR ) 20 MG tablet Take 1 tablet (20 mg total) by mouth daily. 05/05/24   Okey Vina GAILS, MD  traZODone  (DESYREL ) 50 MG tablet Take 0.5-1 tablets (25-50 mg total) by mouth at bedtime as needed for sleep. 01/20/24   Whitfield Raisin, NP  WELLBUTRIN XL 150 MG 24 hr tablet Take 150 mg by mouth daily. 07/16/22   [provider]    Allergies: Latex, Levofloxacin, Penicillins, and Quinolones    Review of Systems  Constitutional:        Fall    Updated Vital Signs BP 132/79 (BP Location: Left Arm)   Pulse 72   Temp (!) 97.5 F (36.4 C) (Oral)   Resp 16   Wt 74.8 kg   SpO2 96%   BMI 23.68 kg/m   Physical Exam Vitals and nursing note reviewed.  Constitutional:      General: He is  not in acute distress.    Appearance: He is not ill-appearing or toxic-appearing.  HENT:     Head: Normocephalic and atraumatic.  Eyes:     General: No scleral icterus.       Right eye: No discharge.        Left eye: No discharge.     Conjunctiva/sclera: Conjunctivae normal.     Comments: 2cm superficial laceration on left eyelid. Mild surrounding bruising and erythema. EOM intact. PERRL. Vision grossly intact.  Cardiovascular:     Rate and Rhythm: Normal rate.  Pulmonary:     Effort: Pulmonary effort is normal.  Abdominal:     General: Abdomen is flat.  Skin:    General: Skin is warm and dry.  Neurological:     General: No focal deficit present.     Mental Status: He is alert and oriented to person, place, and time. Mental status is at baseline.     Comments: GCS 15.  Speech is goal oriented. No deficits appreciated to CN III-XII; symmetric eyebrow raise, no facial drooping, tongue midline. Patient has equal grip strength bilaterally with 5/5 strength against resistance in all major muscle groups bilaterally. Sensation to light touch intact. Patient moves extremities without ataxia.    Psychiatric:        Mood and Affect: Mood normal.        Behavior: Behavior normal.     (all labs ordered are listed, but only abnormal results are displayed) Labs Reviewed - No data to display  EKG: None  Radiology: CT Maxillofacial WO CM Result Date: 05/05/2024 EXAM: CT OF THE FACE WITHOUT CONTRAST 05/05/2024 09:00:54 AM TECHNIQUE: CT of the face was performed without the administration of intravenous contrast. Multiplanar reformatted images are provided for review. Automated exposure control, iterative reconstruction, and/or weight based adjustment of the mA/kV was utilized to reduce the radiation dose to as low as reasonably achievable. COMPARISON: None available. CLINICAL HISTORY: Facial trauma, blunt. FINDINGS: FACIAL BONES: The facial bones are intact. No mandibular dislocation. No suspicious bone lesion. ORBITS: Globes are intact. There is mild left periorbital soft tissue swelling. No acute traumatic injury. No inflammatory change. SINUSES AND MASTOIDS: There is minimal mucosal disease in the floor of the left maxillary sinus. SOFT TISSUES: No acute abnormality. IMPRESSION: 1. No acute facial fracture. 2. Mild left periorbital soft tissue swelling. 3. Minimal mucosal disease in the floor of the left maxillary sinus. Electronically signed by: Evalene Coho MD 05/05/2024 09:11 AM EDT RP Workstation: [id number]   CT Cervical Spine Wo Contrast Result Date: 05/05/2024 EXAM: CT CERVICAL SPINE WITHOUT CONTRAST 05/05/2024 09:00:54 AM TECHNIQUE: CT of the cervical spine was performed without the administration of intravenous contrast. Multiplanar reformatted images are  provided for review. Automated exposure control, iterative reconstruction, and/or weight based adjustment of the mA/kV was utilized to reduce the radiation dose to as low as reasonably achievable. COMPARISON: CT of the cervical spine dated 08/18/2023. CLINICAL HISTORY: Neck trauma (Age >= 65y). FINDINGS: CERVICAL SPINE: BONES AND ALIGNMENT: No acute fracture or traumatic malalignment. There is reversal of the normal cervical lordosis. DEGENERATIVE CHANGES: There is diffuse chronic degenerative disc disease again demonstrated at C3-C4, C4-C5, C5-C6, C6-C7 and C7-T1. There is uncovertebral joint hypertrophy resulting in severe bilateral neuroforaminal stenosis at C5-C6 and severe left neural foraminal stenosis at C6-C7. SOFT TISSUES: No prevertebral soft tissue swelling. There is moderate calcific plaque within the carotid bulbs. There are shotty cervical lymph nodes present bilaterally. IMPRESSION: 1. No acute abnormality of the cervical  spine related to the reported neck trauma. 2. Reversal of the normal cervical lordosis. 3. Diffuse chronic degenerative disc disease at C3-4, C4-5, C5-6, C6-7, and C7-T1 with uncovertebral joint hypertrophy resulting in severe bilateral neuroforaminal stenosis at C5-6 and severe left neural foraminal stenosis at C6-7. Electronically signed by: Evalene Coho MD 05/05/2024 09:10 AM EDT RP Workstation: [id number]   CT Head Wo Contrast Result Date: 05/05/2024 EXAM: CT HEAD WITHOUT CONTRAST 05/05/2024 09:00:54 AM TECHNIQUE: CT of the head was performed without the administration of intravenous contrast. Automated exposure control, iterative reconstruction, and/or weight based adjustment of the mA/kV was utilized to reduce the radiation dose to as low as reasonably achievable. COMPARISON: 08/18/2023 CLINICAL HISTORY: Head trauma, minor (Age >= 65y). FINDINGS: BRAIN AND VENTRICLES: No acute hemorrhage. No evidence of acute infarct. No hydrocephalus. No extra-axial collection. No mass  effect or midline shift. There is mild periventricular cerebral white matter disease. ORBITS: No acute abnormality. SINUSES: No acute abnormality. SOFT TISSUES AND SKULL: No acute soft tissue abnormality. No skull fracture. Atherosclerosis of skullbase vasculature. IMPRESSION: 1. No acute intracranial abnormality. 2. Mild periventricular cerebral white matter disease. Electronically signed by: Evalene Coho MD 05/05/2024 09:03 AM EDT RP Workstation: [id number]     Procedures   Medications Ordered in the ED  acetaminophen  (TYLENOL ) tablet 650 mg (650 mg Oral Not Given 05/05/24 0837)  Tdap (ADACEL) injection 0.5 mL (0.5 mLs Intramuscular Given 05/05/24 9171)                                    Medical Decision Making Amount and/or Complexity of Data Reviewed Radiology: ordered.  Risk OTC drugs. Prescription drug management.   This patient presents to the ED after a fall, this involves an extensive number of treatment options, and is a complaint that carries with it a high risk of complications and morbidity.  The differential diagnosis includes  intracranial hemorrhage, subdural/epidural hematoma, vertebral fracture, spinal cord injury, muscle strain, skull fracture, fracture.   Co morbidities that complicate the patient evaluation  HTN, BPH, HLD, CAD, CLL   Additional history obtained:  Dr. Wendolyn PCP   Problem List / ED Course / Critical interventions / Medication management  Patient presented for fall and head trauma. Patient with stable vitals and does not appear to be in distress. Physical exam with 2cm laceration to left eyelid with mild swelling and bruising. No other concerning symptoms such as eye pain or vision changes. Rest of physical exam reassuring. Patient afebrile with stable vitals. Tetanus dose updated today. I ordered imaging studies including CT head/maxillofacial/cervical spine . I independently visualized and interpreted imaging which showed no acute  process other that mild swelling of left eyelid. I agree with the radiologist interpretation. Consulted with ophthalmologist on-call Dr. Marcey who would like to see patient in his office now to further evaluate.  Shared with patient who agrees to head straight to Dr. Milana office now. Patient provided with address. Staffed with Dr. Charlyn who agrees with plan.  I have reviewed the patients home medicines and have made adjustments as needed The patient has been appropriately medically screened and/or stabilized in the ED. I have low suspicion for any other emergent medical condition which would require further screening, evaluation or treatment in the ED or require inpatient management. At time of discharge the patient is hemodynamically stable and in no acute distress. I have discussed work-up results and diagnosis with patient and  answered all questions. Patient is agreeable with discharge plan. We discussed strict return precautions for returning to the emergency department and they verbalized understanding.     Social Determinants of Health:  geriatric        Final diagnoses:  Fall, initial encounter  Left eyelid laceration, initial encounter    ED Discharge Orders     None          Hoy Nidia FALCON, [place] 05/05/24 1008    Charlyn Sora, MD 05/06/24 [phone]

## 2024-05-05 NOTE — Telephone Encounter (Signed)
 Sent to pharmacy to refill.

## 2024-05-05 NOTE — Telephone Encounter (Signed)
*  STAT* If patient is at the pharmacy, call can be transferred to refill team.   1. Which medications need to be refilled? (please list name of each medication and dose if known)   rosuvastatin  (CRESTOR ) 20 MG tablet   2. Would you like to learn more about the convenience, safety, & potential cost savings by using the [facility]?   3. Are you open to using the [facility] (Type [facility]. ).  4. Which pharmacy/location (including street and city if local pharmacy) is medication to be sent to?  [id number] - [place], [place] - [address]   5. Do they need a 30 day or 90 day supply?   90 day  Patient stated he has 2-3 tablets left and will be going out of town tomorrow.  Patient has appointment scheduled with Dr. Okey on 12/16.

## 2024-05-17 ENCOUNTER — Other Ambulatory Visit: Payer: Self-pay | Admitting: Thoracic Surgery (Cardiothoracic Vascular Surgery)

## 2024-05-17 DIAGNOSIS — C9110 Chronic lymphocytic leukemia of B-cell type not having achieved remission: Secondary | ICD-10-CM | POA: Diagnosis not present

## 2024-05-17 DIAGNOSIS — I7121 Aneurysm of the ascending aorta, without rupture: Secondary | ICD-10-CM

## 2024-05-17 DIAGNOSIS — Q2381 Bicuspid aortic valve: Secondary | ICD-10-CM

## 2024-05-18 DIAGNOSIS — M25562 Pain in left knee: Secondary | ICD-10-CM | POA: Diagnosis not present

## 2024-05-19 DIAGNOSIS — F410 Panic disorder [episodic paroxysmal anxiety] without agoraphobia: Secondary | ICD-10-CM | POA: Diagnosis not present

## 2024-05-19 DIAGNOSIS — H2513 Age-related nuclear cataract, bilateral: Secondary | ICD-10-CM | POA: Diagnosis not present

## 2024-05-19 DIAGNOSIS — H35033 Hypertensive retinopathy, bilateral: Secondary | ICD-10-CM | POA: Diagnosis not present

## 2024-05-19 DIAGNOSIS — F341 Dysthymic disorder: Secondary | ICD-10-CM | POA: Diagnosis not present

## 2024-05-19 DIAGNOSIS — H11153 Pinguecula, bilateral: Secondary | ICD-10-CM | POA: Diagnosis not present

## 2024-05-19 DIAGNOSIS — S01112A Laceration without foreign body of left eyelid and periocular area, initial encounter: Secondary | ICD-10-CM | POA: Diagnosis not present

## 2024-05-22 NOTE — Progress Notes (Unsigned)
 SABRA

## 2024-05-23 ENCOUNTER — Encounter: Payer: Self-pay | Admitting: Neurology

## 2024-05-23 ENCOUNTER — Ambulatory Visit: Admitting: Neurology

## 2024-05-23 VITALS — BP 122/76 | HR 77 | Ht 70.0 in | Wt 174.8 lb

## 2024-05-23 DIAGNOSIS — G4730 Sleep apnea, unspecified: Secondary | ICD-10-CM | POA: Diagnosis not present

## 2024-05-23 DIAGNOSIS — G4733 Obstructive sleep apnea (adult) (pediatric): Secondary | ICD-10-CM

## 2024-05-23 DIAGNOSIS — G4734 Idiopathic sleep related nonobstructive alveolar hypoventilation: Secondary | ICD-10-CM
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
## 2024-05-25 DIAGNOSIS — M25562 Pain in left knee: Secondary | ICD-10-CM | POA: Diagnosis not present

## 2024-05-25 DIAGNOSIS — M1711 Unilateral primary osteoarthritis, right knee: Secondary | ICD-10-CM | POA: Diagnosis not present

## 2024-06-05 DIAGNOSIS — M25512 Pain in left shoulder: Secondary | ICD-10-CM | POA: Diagnosis not present

## 2024-06-05 DIAGNOSIS — M25511 Pain in right shoulder: Secondary | ICD-10-CM | POA: Diagnosis not present

## 2024-06-06 ENCOUNTER — Encounter: Payer: Self-pay | Admitting: Thoracic Surgery (Cardiothoracic Vascular Surgery)

## 2024-06-07 ENCOUNTER — Ambulatory Visit: Admitting: Psychology

## 2024-06-07 DIAGNOSIS — F331 Major depressive disorder, recurrent, moderate: Secondary | ICD-10-CM

## 2024-06-07 NOTE — Progress Notes (Signed)
 [facility] Counselor Initial Adult Exam  Name: Eugene Guzman. Date: 06/07/2024 MRN: [id number] DOB: 08/24/1955 PCP: Wendolyn Jenkins Jansky, MD  Time spent: 60 minutes  Guardian/Payee:  N/A    Paperwork requested: No   Reason for Visit /Presenting Problem: Symptoms of Depression, Grief and Panic   Mental Status Exam: Appearance:   Casual     Behavior:  Appropriate  Motor:  Normal  Speech/Language:   Clear and Coherent  Affect:  Appropriate  Mood:  depressed  Thought process:  normal  Thought content:    WNL  Sensory/Perceptual disturbances:    WNL  Orientation:  oriented to person, place, and situation  Attention:  Good  Concentration:  Good  Memory:  WNL  Fund of knowledge:   Good  Insight:    Good  Judgment:   Good  Impulse Control:  Good    Reported Symptoms:  sadness, powerlessness, anxiety/panic  Risk Assessment: Danger to Self:  No Self-injurious Behavior: No Danger to Others: No Duty to Warn:no Physical Aggression / Violence:No  Access to Firearms a concern: No  Gang Involvement:No  Patient / guardian was educated about steps to take if suicide or homicide risk level increases between visits: n/a While future psychiatric events cannot be accurately predicted, the patient does not currently require acute inpatient psychiatric care and does not currently meet [place]  involuntary commitment criteria.  Substance Abuse History: Current substance abuse: No      Past Psychiatric History:   Previous psychological history is significant for depression and panic Outpatient Providers:Lisa Polis History of Psych Hospitalization: No  Psychological Testing: N/A   Abuse History:  Victim of: No., N/A   Report needed: No. Victim of Neglect:No. Perpetrator of N/A  Witness / Exposure to Domestic Violence: No   Protective Services Involvement: No  Witness to [employer] Violence:  No   Family History:  Family History  Problem Relation Age of Onset   Hypertension Mother    Cancer Mother    Hearing loss Mother    Heart attack Father  70   Arthritis Father    Depression Father    Heart disease Father    Cancer Sister    Hearing loss Sister    Depression Paternal Uncle    Depression Paternal Uncle    Ovarian cancer Maternal Grandmother    Cancer Maternal Grandmother    Hearing loss Maternal Grandfather    Heart disease Maternal Grandfather    Hypertension Maternal Grandfather    Stroke Maternal Grandfather    Colon cancer Paternal Grandfather 93   Cancer Paternal Grandfather    Esophageal cancer Neg Hx    Rectal cancer Neg Hx    Stomach cancer Neg Hx     Living situation: the patient lives with their spouse  Sexual Orientation: Straight  Relationship Status: married  Name of spouse / other:unknown If a parent, number of children / ages:none  Support Systems: N/A  Surveyor, Quantity Stress:  No   Income/Employment/Disability: Social Herbalist: unknown  Educational History: Education: Risk Manager: unknown  Any cultural differences that may affect / interfere with treatment:  not applicable   Recreation/Hobbies: unknown  Stressors: Loss of mother    Strengths: Supportive Relationships and Able to Communicate Effectively  Barriers:  unknown   Legal History: Pending legal issue / charges: The patient has no significant history of legal issues. History of legal  issue / charges: N/A  Medical History/Surgical History: reviewed Past Medical History:  Diagnosis Date   Allergy    childhood - penicillin, 2014 - latex   Anxiety 1990   Aortic aneurysm    Arthritis 2014   right shoulder   CLL (chronic lymphocytic leukemia) (HCC)    Coronary artery calcification seen on CT scan    Depressed    Enlarged prostate    Hx of adenomatous polyp of colon 02/2018   Dr. Milon   Hyperlipidemia    Hypertension 2021   Sleep apnea 2022   Have stopped using CPAP    Past Surgical History:  Procedure Laterality Date   CORONARY PRESSURE/FFR STUDY N/A 04/28/2022   Procedure: INTRAVASCULAR PRESSURE WIRE/FFR STUDY;  Surgeon: Anner Alm ORN, MD;  Location: [facility] INVASIVE CV LAB;  Service: Cardiovascular;  Laterality: N/A;   LEFT HEART CATH AND CORONARY ANGIOGRAPHY N/A 04/28/2022   Procedure: LEFT HEART CATH AND CORONARY ANGIOGRAPHY;  Surgeon: Anner Alm ORN, MD;  Location: [facility] INVASIVE CV LAB;  Service: Cardiovascular;  Laterality: N/A;   TONSILLECTOMY  1961   TRANSURETHRAL RESECTION OF PROSTATE N/A 03/19/2023   Procedure: TRANSURETHRAL RESECTION OF THE PROSTATE (TURP);  Surgeon: Alvaro Ricardo KATHEE Mickey., MD;  Location: [facility] ORS;  Service: Urology;  Laterality: N/A;    Medications: Current Outpatient Medications  Medication Sig Dispense Refill   amLODipine  (NORVASC ) 5 MG tablet Take 1 tablet (5 mg total) by mouth daily. 90 tablet 3   aspirin  EC 81 MG tablet Take 1 tablet (81 mg total) by mouth daily. Swallow whole. 90 tablet 3   B COMPLEX VITAMINS PO Take 1 capsule by mouth daily. 100 mg daily     Cholecalciferol (VITAMIN D3) 50 MCG (2000 UT) CAPS Take 1 capsule by mouth daily.     ezetimibe  (ZETIA ) 10 MG tablet Take 1 tablet (10 mg total) by mouth daily. 90 tablet 2   fluvoxaMINE  (LUVOX ) 100 MG tablet Take 200 mg by mouth at bedtime.     lamoTRIgine  (LAMICTAL ) 200 MG tablet Take 200 mg by mouth at bedtime.     multivitamin (ONE-A-DAY MEN'S) TABS  tablet Take 1  tablet by mouth at bedtime.     naltrexone  (DEPADE) 50 MG tablet Take 1 tablet (50 mg total) by mouth daily. 30 tablet 0   omeprazole  (PRILOSEC) 20 MG capsule Take 1 capsule (20 mg total) by mouth daily as needed. 30 capsule 5   PREVIDENT 5000 BOOSTER PLUS 1.1 % PSTE Place 1 Application onto teeth daily.     rosuvastatin  (CRESTOR ) 20 MG tablet Take 1 tablet (20 mg total) by mouth daily. 90 tablet 2   traZODone  (DESYREL ) 50 MG tablet Take 0.5-1 tablets (25-50 mg total) by mouth at bedtime as needed for sleep. 30 tablet 5   WELLBUTRIN XL 150 MG 24 hr tablet Take 150 mg by mouth daily.     No current facility-administered medications for this visit.    Allergies  Allergen Reactions   Latex Rash   Levofloxacin Other (See Comments)    Avoid fluoroquinolone antibiotics due to thoracic aortic aneurysm   Penicillins Other (See Comments)    Childhood  Other Reaction(s): Unknown   Quinolones Other (See Comments)    Patient with ATAA (ascending thoracic aortic aneurysm)    Diagnoses:  Major Depression, Panic, Grief  Plan of Care: Outpatient Psychotherapy and medication management  Initial Note: He states he has taken psychotropic medication for years and sees Olam Holly at [facility]. States [place] and feels he has suffered depression most of his life. He only took vacation once a year and would get really depressed when it was over. First significant depression was at 33 when he had a break-up. Had more minor depressive episodes earlier in life. Change has always been difficult for him. Also has had severe panic attacks. First one he remembers was first day of his fifth year of college. He was distressed that this was the end of his college career. He recalls having a panic when on business trip to [place] in a [employer]. He had to come home. 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 He was offered early retirement and had a panic  when he realized that he just quit a job of 30 years and his life was changing. When he has panic, he doesn't have a fear of death, rather it is a fear of something he can't control. His anxiety was less prevalent in his life than the depression.  He says my major problem in life is depression and panic. His mother passed and he is struggling with her loss. She died 2 weeks ago at 23 and was a vibrant person for 92 years. In 1 year, she had rapid dementia. He says, she was his best friend. He has a small farm down the road from where his mother lived. He says I don't handle death well. His reason to seek therapy was before mother was deteriorating. He had tried therapy in the past and it was not helpful. Also, he has had medical challenges himself, but says it does not cause him distress. He has been on a number of different anti-depressants over the years.  He contacted me for his depression, ut says the grief is now most prevalent.  He has 1 sister in [place]. She is 3 years older and they have a good relationship, but she has kids and grandchildren and they keep her busy. He married at 56 and wife was 21. His wife will occasionally tell him she blames him for not  having kids. He told her when they married that he was too old to have kids. He says it was his inability to handle change. This is his only marriage for both he and his wife. Wife is from [place]  and has 3 sisters and one brother. None of her family in this area. He left [employer] at 52 (too early). 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. Wife retired in 2013.  Will complete intake at next visit. Patient states he wants to continue treatment and prefers face to face when possible.   Goals/Tx. Plan: Patient states that he is seeking symptom relief from life long depressive episodes. First, however, he needs support and strategies to manage grief over the recent (2 weeks) loss of his mother. Will engage in grief counseling  to reduce current despair. In addition, treatment will address his anxiety that contributes to episodic panic attacks. This will involve relaxation techniques and other behavioral strategies  to facilitate greater control over his anxiety. Patient agrees to plan with a goal date of 12-26.   Patient agrees to a video Public Affairs Consultant) session and is aware of the platform limits. He is at home and provider in his office. Session note: Patient states that he and wife went to [facility] for Thanksgiving and had a good time. He talked about having less to do since his mother passed and he doesn't have to handle her finances. He and wife plan to go to [facility] in for Christmas. Says he and sister have been talking more lately and thinks it is related to the strain on family relationships since mother's death. Most of Vernon's issues are with his brother in law and niece. Reports that anxiety and moods are relatively stable.                     CONI ALM KERNS, PhD 10:40a-11:30p 50 minutes

## 2024-06-09 ENCOUNTER — Ambulatory Visit (HOSPITAL_COMMUNITY)
Admission: RE | Admit: 2024-06-09 | Discharge: 2024-06-09 | Attending: Thoracic Surgery (Cardiothoracic Vascular Surgery) | Admitting: Thoracic Surgery (Cardiothoracic Vascular Surgery)

## 2024-06-09 DIAGNOSIS — I700 Atherosclerosis of aorta: Secondary | ICD-10-CM | POA: Diagnosis not present

## 2024-06-09 DIAGNOSIS — I2510 Atherosclerotic heart disease of native coronary artery without angina pectoris: Secondary | ICD-10-CM | POA: Diagnosis not present

## 2024-06-09 DIAGNOSIS — Q2381 Bicuspid aortic valve: Secondary | ICD-10-CM | POA: Diagnosis not present

## 2024-06-09 DIAGNOSIS — I7121 Aneurysm of the ascending aorta, without rupture: Secondary | ICD-10-CM

## 2024-06-09 MED ORDER — IOHEXOL 350 MG/ML SOLN
100.0000 mL | Freq: Once | INTRAVENOUS | Status: AC | PRN
  Administered 2024-06-09: 100 mL via INTRAVENOUS

## 2024-06-13 ENCOUNTER — Ambulatory Visit (HOSPITAL_BASED_OUTPATIENT_CLINIC_OR_DEPARTMENT_OTHER): Payer: Self-pay | Admitting: Family Medicine

## 2024-06-13 ENCOUNTER — Ambulatory Visit: Payer: Self-pay

## 2024-06-13 ENCOUNTER — Ambulatory Visit (INDEPENDENT_AMBULATORY_CARE_PROVIDER_SITE_OTHER)

## 2024-06-13 ENCOUNTER — Other Ambulatory Visit: Payer: Self-pay

## 2024-06-13 ENCOUNTER — Other Ambulatory Visit (HOSPITAL_BASED_OUTPATIENT_CLINIC_OR_DEPARTMENT_OTHER): Payer: Self-pay

## 2024-06-13 ENCOUNTER — Encounter (HOSPITAL_BASED_OUTPATIENT_CLINIC_OR_DEPARTMENT_OTHER): Payer: Self-pay | Admitting: Family Medicine

## 2024-06-13 ENCOUNTER — Ambulatory Visit (INDEPENDENT_AMBULATORY_CARE_PROVIDER_SITE_OTHER): Admitting: Family Medicine

## 2024-06-13 VITALS — BP 112/67 | HR 67 | Temp 97.7°F | Ht 70.0 in | Wt 171.0 lb

## 2024-06-13 DIAGNOSIS — R051 Acute cough: Secondary | ICD-10-CM | POA: Diagnosis not present

## 2024-06-13 DIAGNOSIS — R0789 Other chest pain: Secondary | ICD-10-CM | POA: Diagnosis not present

## 2024-06-13 LAB — POCT INFLUENZA A/B
Influenza A, POC: NEGATIVE
Influenza B, POC: NEGATIVE

## 2024-06-13 LAB — POC COVID19 BINAXNOW: SARS Coronavirus 2 Ag: NEGATIVE

## 2024-06-13 MED ORDER — DOXYCYCLINE HYCLATE 100 MG PO TABS
100.0000 mg | ORAL_TABLET | Freq: Two times a day (BID) | ORAL | 0 refills | Status: DC
  Filled 2024-06-13: qty 14, 7d supply, fill #0

## 2024-06-13 NOTE — Telephone Encounter (Signed)
 FYI Only or Action Required?: FYI only for provider: appointment scheduled on 06/13/24.  Patient was last seen in primary care on 03/14/2024 by Wendolyn Jenkins Jansky, MD.  Called Nurse Triage reporting Cough.  Symptoms began 06/04/24.  Interventions attempted: OTC medications: Delsym.  Symptoms are: gradually worsening.  Triage Disposition: See HCP Within 4 Hours (Or PCP Triage)  Patient/caregiver understands and will follow disposition?: Yes               Copied from CRM [id number]. Topic: Clinical - Red Word Triage >> Jun 13, 2024 12:33 PM Ashley R wrote: Red Word that prompted transfer to Nurse Triage: cough, worsening. Onset within last 2 weeks, requesting information about nearest urgent care.   ----------------------------------------------------------------------- From previous Reason for Contact - Scheduling: Patient/patient representative is calling to schedule an appointment. Refer to attachments for appointment information. Reason for Disposition  [1] Fever > 101 F (38.3 C) AND [2] age > 60 years  Answer Assessment - Initial Assessment Questions 1. ONSET: When did the cough begin?      Since 06/04/24.  2. SEVERITY: How bad is the cough today?      5/10, coughing is hard enough it makes him want to throw up but he does not vomit. Sometimes it is slightly productive but not much.  3. SPUTUM: Describe the color of your sputum (e.g., none, dry cough; clear, white, yellow, green)     Clear.  4. HEMOPTYSIS: Are you coughing up any blood? If Yes, ask: How much? (e.g., flecks, streaks, tablespoons, etc.)     No.  5. DIFFICULTY BREATHING: Are you having difficulty breathing? If Yes, ask: How bad is it? (e.g., mild, moderate, severe)      No.  6. FEVER: Do you have a fever? If Yes, ask: What is your temperature, how was it measured, and when did it start?     Yes, 101 one day last week and he thinks that was Wednesday and just lasted that one day for a  few hours.  7. CARDIAC HISTORY: Do you have any history of heart disease? (e.g., heart attack, congestive heart failure)      Aortic aneurysm, angina.  8. LUNG HISTORY: Do you have any history of lung disease?  (e.g., pulmonary embolus, asthma, emphysema)     OSA.  9. PE RISK FACTORS: Do you have a history of blood clots? (or: recent major surgery, recent prolonged travel, bedridden)     No.  10. OTHER SYMPTOMS: Do you have any other symptoms? (e.g., runny nose, wheezing, chest pain)       He states it started as a head cold, head congestion with runny nose. Right side of throat aching down to the base of chest. Fatigue.  11. PREGNANCY: Is there any chance you are pregnant? When was your last menstrual period?       N/A.  12. TRAVEL: Have you traveled out of the country in the last month? (e.g., travel history, exposures)       Travel on a flight 2 weeks ago, unsure of exposures.  Protocols used: Cough - Acute Productive-A-AH

## 2024-06-13 NOTE — Telephone Encounter (Signed)
 Noted

## 2024-06-13 NOTE — Progress Notes (Signed)
 Mr. Gheen,  Your chest xray and EKG are unremarkable. No signs of pneumonia. I have sent in an antibiotic for you to take for the ongoing upper respiratory infection. If you do not improve in the next 1-2 weeks please reach out to your PCP.

## 2024-06-13 NOTE — Progress Notes (Signed)
 Acute Care Office Visit  Subjective:   Eugene Guzman. 23-Jun-1956 06/13/2024  Chief Complaint  Patient presents with   Cough    Pt states he went to [facility] for Thanksgiving. About 1 week ago he started feeling like he had a head cold and now has a cough. States he does have a productive cough in the mornings. Denies any fever that he knows of.    HPI: Patient states he flew to [facility] [place] for Thanksgiving and was around family member who may have been sick. He states approx 10 days ago he began coughing, head congestion and chest wall pain to right side of chest. He reports having acid reflux like sensation in right chest radiating to right jaw. Denies fever, chills, diarrhea or vomiting. He is using Delsym for cough. He is having mild nausea and congested productive cough intermittently throughout the day.   Denies SHOB, palpitations, numbness or radiation of pain.     The following portions of the patient's history were reviewed and updated as appropriate: past medical history, past surgical history, family history, social history, allergies, medications, and problem list.   Patient Active Problem List   Diagnosis Date Noted   COVID-19 07/27/2023   Prostate hypertrophy 03/19/2023   Lymphadenopathy, cervical 11/13/2022   CLL (chronic lymphocytic leukemia) (HCC) 07/06/2022   Abnormal nuclear stress test    Atypical angina    Panic disorder without agoraphobia 03/02/2022   Depression 03/02/2022   Fatigue 03/02/2022   Severe sleep apnea 02/19/2022   Severe obstructive sleep apnea-hypopnea syndrome 09/10/2021   Chronic intermittent hypoxia with obstructive sleep apnea 09/10/2021   Sleep apnea, obstructive 07/22/2021   Snoring 07/22/2021   Vitamin D  deficiency 05/07/2021   Hypertension 01/31/2021   BPH (benign prostatic hyperplasia) 01/31/2021   Depression, recurrent 01/31/2021   Hyperlipidemia 01/31/2021   CAD (coronary artery disease) 01/31/2021    Ascending aortic aneurysm 01/31/2021   Aneurysm, aortic 07/07/2019   History of adenomatous polyp of colon 02/28/2018   Upper airway cough syndrome 07/19/2015   Past Medical History:  Diagnosis Date   Allergy    childhood - penicillin, 2014 - latex   Anxiety    Aortic aneurysm    Arthritis 2014   right shoulder   CLL (chronic lymphocytic leukemia) (HCC)    Coronary artery calcification seen on CT scan    Depressed    Enlarged prostate    Hx of adenomatous polyp of colon 02/2018   Dr. Milon   Hyperlipidemia    Hypertension    Sleep apnea 2022   Have stopped using CPAP   Past Surgical History:  Procedure Laterality Date   CORONARY PRESSURE/FFR STUDY N/A 04/28/2022   Procedure: INTRAVASCULAR PRESSURE WIRE/FFR STUDY;  Surgeon: Anner Alm ORN, MD;  Location: [facility] INVASIVE CV LAB;  Service: Cardiovascular;  Laterality: N/A;   LEFT HEART CATH AND CORONARY ANGIOGRAPHY N/A 04/28/2022   Procedure: LEFT HEART CATH AND CORONARY ANGIOGRAPHY;  Surgeon: Anner Alm ORN, MD;  Location: [facility] INVASIVE CV LAB;  Service: Cardiovascular;  Laterality: N/A;   TONSILLECTOMY  1961   TRANSURETHRAL RESECTION OF PROSTATE N/A 03/19/2023   Procedure: TRANSURETHRAL RESECTION OF THE PROSTATE (TURP);  Surgeon: Alvaro Ricardo KATHEE Mickey., MD;  Location: [facility] OR[facility];  Service: Urology;  Laterality: N/A;   Family History  Problem Relation Age of Onset   Hypertension Mother    Cancer Mother    Hearing loss Mother    Heart attack Father 53  Arthritis Father    Depression Father    Heart disease Father    Cancer Sister    Hearing loss Sister    Depression Paternal Uncle    Depression Paternal Uncle    Ovarian cancer Maternal Grandmother    Cancer Maternal Grandmother    Hearing loss Maternal Grandfather    Heart disease Maternal Grandfather    Hypertension Maternal Grandfather    Stroke Maternal Grandfather    Colon cancer Paternal Grandfather 59   Cancer Paternal Grandfather    Esophageal cancer Neg Hx     Rectal cancer Neg Hx    Stomach cancer Neg Hx    Outpatient Medications Prior to Visit  Medication Sig Dispense Refill   amLODipine  (NORVASC ) 5 MG tablet Take 1 tablet (5 mg total) by mouth daily. 90 tablet 3   aspirin  EC 81 MG tablet Take 1 tablet (81 mg total) by mouth daily. Swallow whole. 90 tablet 3   B COMPLEX VITAMINS PO Take 1 capsule by mouth daily. 100 mg daily     Cholecalciferol (VITAMIN D3) 50 MCG (2000 UT) CAPS Take 1 capsule by mouth daily.     ezetimibe  (ZETIA ) 10 MG tablet Take 1 tablet (10 mg total) by mouth daily. 90 tablet 2   fluvoxaMINE  (LUVOX ) 100 MG tablet Take 200 mg by mouth at bedtime.     lamoTRIgine  (LAMICTAL ) 200 MG tablet Take 200 mg by mouth at bedtime.     multivitamin (ONE-A-DAY MEN'S) TABS tablet Take 1 tablet by mouth at bedtime.     naltrexone  (DEPADE) 50 MG tablet Take 1 tablet (50 mg total) by mouth daily. 30 tablet 0   omeprazole  (PRILOSEC) 20 MG capsule Take 1 capsule (20 mg total) by mouth daily as needed. 30 capsule 5   PREVIDENT 5000 BOOSTER PLUS 1.1 % PSTE Place 1 Application onto teeth daily.     rosuvastatin  (CRESTOR ) 20 MG tablet Take 1 tablet (20 mg total) by mouth daily. 90 tablet 2   traZODone  (DESYREL ) 50 MG tablet Take 0.5-1 tablets (25-50 mg total) by mouth at bedtime as needed for sleep. 30 tablet 5   WELLBUTRIN XL 150 MG 24 hr tablet Take 150 mg by mouth daily.     No facility-administered medications prior to visit.   Allergies  Allergen Reactions   Latex Rash   Levofloxacin Other (See Comments)    Avoid fluoroquinolone antibiotics due to thoracic aortic aneurysm   Penicillins Other (See Comments)    Childhood  Other Reaction(s): Unknown   Quinolones Other (See Comments)    Patient with ATAA (ascending thoracic aortic aneurysm)     ROS: A complete ROS was performed with pertinent positives/negatives noted in the HPI. The remainder of the ROS are negative.    Objective:   Today's Vitals   06/13/24 1415  BP: 112/67   Pulse: 67  Temp: 97.7 F (36.5 C)  TempSrc: Oral  SpO2: 98%  Weight: 171 lb (77.6 kg)  Height: 5' 10 (1.778 m)    GENERAL: Well-appearing, in NAD. Well nourished.  SKIN: Pink, warm and dry. No rash, lesion, ulceration, or ecchymoses.  Head: Normocephalic. NECK: Trachea midline. Full ROM w/o pain or tenderness. No lymphadenopathy.  EARS: Tympanic membranes are intact, translucent without bulging and without drainage. Appropriate landmarks visualized.  EYES: Conjunctiva clear without exudates. EOMI, PERRL, no drainage present.  NOSE: Septum midline w/o deformity. Nares patent, mucosa pink and non-inflamed w/o drainage. No sinus tenderness.  THROAT: Uvula midline. Oropharynx clear. Mucous membranes  pink and moist.  RESPIRATORY: Chest wall symmetrical. No reproducible chest wall pain. Respirations even and non-labored. Breath sounds clear to auscultation bilaterally. Cough is congested, non productive.  CARDIAC: S1, S2 present, regular rate and rhythm without murmur or gallops. Peripheral pulses 2+ bilaterally.  MSK: Muscle tone and strength appropriate for age.  NEUROLOGIC: No motor or sensory deficits. Steady, even gait. C2-C12 intact.  PSYCH/MENTAL STATUS: Alert, oriented x 3. Cooperative, appropriate mood and affect.   EKG: 06/13/24 Vent. rate 63 BPM  PR interval 150 ms  QRS duration 84 ms  QT/QTcB 418/427 ms  P-R-T axes -1 -37 16  Normal sinus rhythm  Left axis deviation  Abnormal ECG  When compared with ECG of 18-Aug-2023 14:34, Incomplete right bundle branch block is no longer Present  No results found for any visits on 06/13/24.    Assessment & Plan:  1. Acute cough (Primary) Concern for lower respiratory given congested cough and chest wall pain with fatigue. Will obtain chest xray. Negative for COVID and Flu. Will treat pending xray. Discussed symptom management and will reach out if no improvement in 1-2 weeks.  - POC COVID-19 [facility] - POCT Influenza A/B - DG Chest  2 View; Future  2. Other chest pain Given hx of atypical angina and chest wall pain, EKG obtained and unremarkable for acute changes or concerns. Will continue with cardiology management. Chest xray obtained.    No orders of the defined types were placed in this encounter.  Lab Orders         POC COVID-19 [facility]axNow         POCT Influenza A/B    No images are attached to the encounter or orders placed in the encounter.  Return if symptoms worsen or fail to improve.    Patient to reach out to office if new, worrisome, or unresolved symptoms arise or if no improvement in patient's condition. Patient verbalized understanding and is agreeable to treatment plan. All questions answered to patient's satisfaction.    Thersia Schuyler Stark, [place]

## 2024-06-18 NOTE — Progress Notes (Unsigned)
 Cardiology Office Note   Date:  06/20/2024   ID:  Debby Misty Johncharles Fusselman., DOB 01/12/1956, MRN [id number]  PCP:  Wendolyn Jenkins Jansky, MD  Cardiologist:   Vina Gull, MD   Patient presents for follow up of CAD    History of Present Illness: Parmvir Boomer. is a 68 y.o. male  CAD, HL, thoracic aortic aneurysm, CLL, anxiety/depression  2021  Ca score CT   2118 (97th percentile)  Ascending aorta  also noted to be 45 mm    Myoview  showed normal perfusion.  Pt is followed by GORMAN Millers for aneurysm      2023 PT noted some fatigue, SOB  Lexiscan  PET/CT showed normal perfusion LVEF normal but flow reserve was 1.88   Based on decreased fow reserve LHC recommended 2023  Echo LVEF normal   Bicuspid AV 2023   LHC showed moderate CAD   60% LAD  FFR insignificant LVEF normal  Recomm Ca channel blocker   I saw the pt in 2024   He was seen by MARLA Mose  in the interval  Noted some mild SOB but very active   Tolerating well  SInce seen he thinks he is doing OK from cardiac standpoint  Denies CP  Breathing is good  Recently he has been busy with paperwork, not as active   He was very active last summer  REcent CTA Aorta measures 4.5 cm     Appt with [facility]CTS in Jan 2025   Current Meds  Medication Sig   amLODipine  (NORVASC ) 5 MG tablet Take 1 tablet (5 mg total) by mouth daily.   aspirin  EC 81 MG tablet Take 1 tablet (81 mg total) by mouth daily. Swallow whole.   B COMPLEX VITAMINS PO Take 1 capsule by mouth daily. 100 mg daily   Cholecalciferol (VITAMIN D3) 50 MCG (2000 UT) CAPS Take 1 capsule by mouth daily.   doxycycline  (VIBRA -TABS) 100 MG tablet Take 1 tablet (100 mg total) by mouth 2 (two) times daily.   ezetimibe  (ZETIA ) 10 MG tablet Take 1 tablet (10 mg total) by mouth daily.   fluvoxaMINE  (LUVOX ) 100 MG tablet Take 200 mg by mouth at bedtime.   lamoTRIgine  (LAMICTAL ) 200 MG tablet Take 200 mg by mouth at bedtime.   multivitamin (ONE-A-DAY MEN'S) TABS tablet Take 1 tablet by mouth  at bedtime.   naltrexone  (DEPADE) 50 MG tablet Take 1 tablet (50 mg total) by mouth daily.   omeprazole  (PRILOSEC) 20 MG capsule Take 1 capsule (20 mg total) by mouth daily as needed.   PREVIDENT 5000 BOOSTER PLUS 1.1 % PSTE Place 1 Application onto teeth daily.   rosuvastatin  (CRESTOR ) 20 MG tablet Take 1 tablet (20 mg total) by mouth daily.   traZODone  (DESYREL ) 50 MG tablet Take 0.5-1 tablets (25-50 mg total) by mouth at bedtime as needed for sleep.   WELLBUTRIN XL 150 MG 24 hr tablet Take 150 mg by mouth daily.     Allergies:   Latex, Levofloxacin, Penicillins, and Quinolones   Past Medical History:  Diagnosis Date   Allergy    childhood - penicillin, 2014 - latex   Anxiety    Aortic aneurysm    Arthritis 2014   right shoulder   CLL (chronic lymphocytic leukemia) (HCC)    Coronary artery calcification seen on CT scan    Depressed    Enlarged prostate    Hx of adenomatous polyp of colon 02/2018   Dr. Milon   Hyperlipidemia  Hypertension    Sleep apnea 2022   Have stopped using CPAP    Past Surgical History:  Procedure Laterality Date   CORONARY PRESSURE/FFR STUDY N/A 04/28/2022   Procedure: INTRAVASCULAR PRESSURE WIRE/FFR STUDY;  Surgeon: Anner Alm ORN, MD;  Location: [facility] INVASIVE CV LAB;  Service: Cardiovascular;  Laterality: N/A;   LEFT HEART CATH AND CORONARY ANGIOGRAPHY N/A 04/28/2022   Procedure: LEFT HEART CATH AND CORONARY ANGIOGRAPHY;  Surgeon: Anner Alm ORN, MD;  Location: [facility] INVASIVE CV LAB;  Service: Cardiovascular;  Laterality: N/A;   TONSILLECTOMY  1961   TRANSURETHRAL RESECTION OF PROSTATE N/A 03/19/2023   Procedure: TRANSURETHRAL RESECTION OF THE PROSTATE (TURP);  Surgeon: Alvaro Ricardo KATHEE Mickey., MD;  Location: [facility] ORS;  Service: Urology;  Laterality: N/A;     Social History:  The patient  reports that he quit smoking about 9 years ago. His smoking use included cigarettes. He has a 9 pack-year smoking history. He has never been exposed to tobacco  smoke. He has never used smokeless tobacco. He reports current alcohol use of about 12.0 standard drinks of alcohol per week. He reports that he does not use drugs.   Family History:  The patient's family history includes Arthritis in his father; Cancer in his maternal grandmother, mother, paternal grandfather, and sister; Colon cancer (age of onset: 46) in his paternal grandfather; Depression in his father, paternal uncle, and paternal uncle; Hearing loss in his maternal grandfather, mother, and sister; Heart attack (age of onset: 66) in his father; Heart disease in his father and maternal grandfather; Hypertension in his maternal grandfather and mother; Ovarian cancer in his maternal grandmother; Stroke in his maternal grandfather.    ROS:  Please see the history of present illness. All other systems are reviewed and  Negative to the above problem except as noted.    PHYSICAL EXAM: VS:  BP 134/70   Pulse 75   Ht 5' 10 (1.778 m)   Wt 167 lb (75.8 kg)   SpO2 97%   BMI 23.96 kg/m   GEN:  Thin 68 yo in no acute distress  HEENT: normal  Neck: no JVD, no carotid bruits Cardiac: RRR; no murmurs   Respiratory:  clear to auscultation  GI: soft, nontender,  No masses No hepatomegaly  Ext  No LE edema   EKG:  EKG SR 72 bpm  LVH    Echo Aug 2025  1. Left ventricular ejection fraction, by estimation, is 55 to 60%. The  left ventricle has normal function. The left ventricle has no regional  wall motion abnormalities. There is mild concentric left ventricular  hypertrophy. Left ventricular diastolic  parameters are consistent with Grade I diastolic dysfunction (impaired  relaxation).   2. Right ventricular systolic function is normal. The right ventricular  size is normal. There is normal pulmonary artery systolic pressure. The  estimated right ventricular systolic pressure is 19.2 mmHg.   3. Left atrial size was mildly dilated.   4. The mitral valve is normal in structure. No evidence of  mitral valve  regurgitation. No evidence of mitral stenosis.   5. The aortic valve is bicuspid with fused raphe between the left and  right cusps. There is moderate calcification of the aortic valve. Aortic  valve regurgitation is trivial. Aortic valve sclerosis/calcification is  present, without any evidence of  aortic stenosis. Aortic valve mean gradient measures 7.0 mmHg.   6. Aortic dilatation noted. There is mild dilatation of the aortic root,  measuring 39 mm. There  is moderate dilatation of the ascending aorta,  measuring 45 mm.   7. The inferior vena cava is normal in size with greater than 50%  respiratory variability, suggesting right atrial pressure of 3 mmHg.   Cardiac Cath   04/28/22  POST-OP DIAGNOSIS Correct findings on stress PET: No obstructive disease Moderate multivessel CAD, calcified: Mid LAD 60% (RFR 0.94-not significant, apical LAD RFR was still not significant-0.92.), ostial LCx 55%, mid RCA 55%. Normal LV function, normal LVEDP     RECOMMENDATIONS Continue to titrate medications for cardiac risk factor reduction Consider increasing calcium  channel blocker for likely microvascular disease based on stress PET results.     Lipid Panel   Last lipds in May LDL 134  HDL 67  Trig 141   LDL in 7989 was 172      Component Value Date/Time   CHOL 121 03/14/2024 1129   CHOL 160 03/04/2021 1117   TRIG 35.0 03/14/2024 1129   HDL 86.10 03/14/2024 1129   HDL 92 03/04/2021 1117   CHOLHDL 1 03/14/2024 1129   VLDL 7.0 03/14/2024 1129   LDLCALC 28 03/14/2024 1129   LDLCALC 54 03/04/2021 1117      Wt Readings from Last 3 Encounters:  06/20/24 167 lb (75.8 kg)  06/13/24 171 lb (77.6 kg)  05/23/24 174 lb 12.8 oz (79.3 kg)      ASSESSMENT AND PLAN:   1  CAD Very high CA score   Lexiscan  PET CT in 2023 showed normal perfusion but abnormal flow reserve.  LHC showed moderate  CAD.   He denies CP   2   Thoracic aneurysm   Follows with S Hendrickson  Aorta  relatively stable  at 4.5 cm  3  HTN   BP is OK   4  HL    Excellent control  LDL 28   HDL 86  trig 35  5  Psych  Follows with D Gutterman    6  CLL   Follows at [facility]   7  Urol  Follow with Dr Alvaro     Will follow up next summer     Current medicines are reviewed at length with the patient today.  The patient does not have concerns regarding medicines.  Signed, Vina Gull, MD

## 2024-06-20 ENCOUNTER — Other Ambulatory Visit (HOSPITAL_COMMUNITY)

## 2024-06-20 ENCOUNTER — Encounter: Payer: Self-pay | Admitting: Internal Medicine

## 2024-06-20 ENCOUNTER — Ambulatory Visit: Admitting: Internal Medicine

## 2024-06-20 VITALS — BP 134/70 | HR 75 | Ht 70.0 in | Wt 167.0 lb

## 2024-06-20 DIAGNOSIS — R972 Elevated prostate specific antigen [PSA]: Secondary | ICD-10-CM | POA: Diagnosis not present

## 2024-06-20 DIAGNOSIS — I2510 Atherosclerotic heart disease of native coronary artery without angina pectoris: Secondary | ICD-10-CM

## 2024-06-20 NOTE — Patient Instructions (Signed)
 Medication Instructions:  Your physician recommends that you continue on your current medications as directed. Please refer to the Current Medication list given to you today.  *If you need a refill on your cardiac medications before your next appointment, please call your pharmacy*  Lab Work: None ordered.  If you have labs (blood work) drawn today and your tests are completely normal, you will receive your results only by: MyChart Message (if you have MyChart) OR A paper copy in the mail If you have any lab test that is abnormal or we need to change your treatment, we will call you to review the results.  Testing/Procedures: None ordered.   Follow-Up: At [facility], you and your health needs are our priority.  As part of our continuing mission to provide you with exceptional heart care, our providers are all part of one team.  This team includes your primary Cardiologist (physician) and Advanced Practice Providers or APPs (Physician Assistants and Nurse Practitioners) who all work together to provide you with the care you need, when you need it.  Your next appointment:   6 months with Dr Okey

## 2024-07-04 ENCOUNTER — Encounter: Payer: Self-pay | Admitting: Thoracic Surgery (Cardiothoracic Vascular Surgery)

## 2024-07-04 ENCOUNTER — Ambulatory Visit
Attending: Thoracic Surgery (Cardiothoracic Vascular Surgery) | Admitting: Thoracic Surgery (Cardiothoracic Vascular Surgery)

## 2024-07-04 VITALS — BP 143/69 | HR 68 | Resp 20 | Ht 70.0 in | Wt 176.6 lb

## 2024-07-04 DIAGNOSIS — I7121 Aneurysm of the ascending aorta, without rupture: Secondary | ICD-10-CM

## 2024-07-04 DIAGNOSIS — Q2381 Bicuspid aortic valve: Secondary | ICD-10-CM | POA: Diagnosis not present

## 2024-07-04 NOTE — Progress Notes (Deleted)
 Eugene Guzman

## 2024-07-04 NOTE — Progress Notes (Signed)
 "  [address] [place] Guzman [address]             [phone]    HPI: Eugene Guzman returns for follow-up of his ascending aneurysm.  Eugene Guzman is a 68 year old man with a history of hypertension, hyperlipidemia, obstructive sleep apnea, coronary calcification, remote tobacco use, CLL, BPH, bicuspid aortic valve, and a 4.5 cm ascending aneurysm.  I first saw him in June 2021.  His aneurysm was 4.5 cm.  It has remained relatively unchanged since then.  He recently saw Dr. Okey and was not having any new cardiac issues.  He has CLL which is being managed at [facility].  He denies any chest pain, pressure, tightness, or shortness of breath.  Past Medical History:  Diagnosis Date   Allergy    childhood - penicillin, 2014 - latex   Anxiety    Aortic aneurysm    Arthritis 2014   right shoulder   CLL (chronic lymphocytic leukemia) (HCC)    Coronary artery calcification seen on CT scan    Depressed    Enlarged prostate    Hx of adenomatous polyp of colon 02/2018   Dr. Milon   Hyperlipidemia    Hypertension    Sleep apnea 2022   Have stopped using CPAP    Current Outpatient Medications  Medication Sig Dispense Refill   amLODipine  (NORVASC ) 5 MG tablet Take 1 tablet (5 mg total) by mouth daily. 90 tablet 3   aspirin  EC 81 MG tablet Take 1 tablet (81 mg total) by mouth daily. Swallow whole. 90 tablet 3   B COMPLEX VITAMINS PO Take 1 capsule by mouth daily. 100 mg daily     Cholecalciferol (VITAMIN D3) 50 MCG (2000 UT) CAPS Take 1 capsule by mouth daily.     ezetimibe  (ZETIA ) 10 MG tablet Take 1 tablet (10 mg total) by mouth daily. 90 tablet 2   fluvoxaMINE  (LUVOX ) 100 MG tablet Take 200 mg by mouth at bedtime.     lamoTRIgine  (LAMICTAL ) 200 MG tablet Take 200 mg by mouth at bedtime.     multivitamin (ONE-A-DAY MEN'S) TABS tablet Take 1 tablet by mouth at bedtime.     naltrexone  (DEPADE) 50 MG tablet Take 1 tablet (50 mg total) by mouth daily. 30 tablet 0    omeprazole  (PRILOSEC) 20 MG capsule Take 1 capsule (20 mg total) by mouth daily as needed. 30 capsule 5   PREVIDENT 5000 BOOSTER PLUS 1.1 % PSTE Place 1 Application onto teeth daily.     rosuvastatin  (CRESTOR ) 20 MG tablet Take 1 tablet (20 mg total) by mouth daily. 90 tablet 2   traZODone  (DESYREL ) 50 MG tablet Take 0.5-1 tablets (25-50 mg total) by mouth at bedtime as needed for sleep. 30 tablet 5   WELLBUTRIN XL 150 MG 24 hr tablet Take 150 mg by mouth daily.     No current facility-administered medications for this visit.    Physical Exam BP (!) 143/69 (BP Location: Left Arm, Patient Position: Sitting, Cuff Size: Normal)   Pulse 68   Resp 20   Ht 5' 10 (1.778 m)   Wt 176 lb 9.6 oz (80.1 kg)   SpO2 97% Comment: RA  BMI 25.17 kg/m  68 year old man in no acute distress Alert and oriented x 3 with no focal deficits Lungs clear with equal breath sounds bilaterally No carotid bruits Cardiac regular rate and rhythm with a faint systolic murmur No peripheral edema  Diagnostic  Tests: CTA CHEST AORTA 06/09/2024 12:40:11 PM   TECHNIQUE: CTA of the chest was performed without and with the administration of 100 mL iohexol  (OMNIPAQUE ) 350 MG/ML injection. Multiplanar reformatted images are provided for review. MIP images are provided for review. Automated exposure control, iterative reconstruction, and/or weight based adjustment of the mA/kV was utilized to reduce the radiation dose to as low as reasonably achievable.   COMPARISON: 12/21/2023   CLINICAL HISTORY: Thoracic aortic aneurysm suspected.   FINDINGS:   AORTA: Thoracic aortic, coronary artery, and branch vessel atheromatous vascular calcifications. Aortic valve calcifications as on image 84 series 301. Aortic caliber is as follows: Sinuses of valsalva 4.2 cm (previously measured at 4.3 cm); Proximal ascending thoracic aorta 4.5 cm (formerly 4.4 cm); upper ascending thoracic aorta 4.2 cm (previously measured at 4.4 cm);  anterior arch 3.1 cm; posterior arch 2.6 cm; mid descending thoracic aorta 2.4 cm. According to the ACR 2013 and SVS 2018 guidelines, the finding is consistent with an aortic aneurysm in the 4.5 cm range, and the recommendation is 56-month interval surveillance imaging. No thoracic aortic dissection.   MEDIASTINUM: No mediastinal lymphadenopathy. The heart and pericardium demonstrate no acute abnormality.   LYMPH NODES: No mediastinal, hilar or axillary lymphadenopathy.   LUNGS AND PLEURA: The lungs are without acute process. No focal consolidation or pulmonary edema. No pleural effusion or pneumothorax.   UPPER ABDOMEN: Limited images of the upper abdomen are unremarkable.   SOFT TISSUES AND BONES: Severe bilateral degenerative glenohumeral arthropathy. Bilateral C7 cervical ribs without vascular impingement. Old healed left lower rib fractures. No acute bone or soft tissue abnormality.   IMPRESSION: 1. Ascending thoracic aortic aneurysm measuring up to 4.5 cm; recommend surveillance imaging in 6 months per guidelines. 2. Aortic valve and thoracic aortic atherosclerotic calcifications. 3. Coronary artery and branch vessel atherosclerotic calcifications. 4. Severe bilateral degenerative glenohumeral arthropathy. 5. Bilateral C7 cervical ribs without vascular impingement. 6. Old healed left lower rib fractures.   Electronically signed by: Ryan Salvage MD 06/09/2024 01:16 PM EST RP Workstation: [id number]   I personally reviewed the CT images.  4.5 cm ascending aneurysm.  4.7 cm at sinuses of Valsalva with some asymmetry of noncoronary sinus.  Coronary and thoracic aortic atherosclerosis  Impression: Eugene Guzman is a 68 year old man with a history of hypertension, hyperlipidemia, obstructive sleep apnea, coronary calcification, remote tobacco use, CLL, BPH, bicuspid aortic valve, and a 4.5 cm ascending aneurysm.  Ascending aneurysm-4.7 cm at sinuses of Valsalva  and 4.5 cm in mid thoracic aorta.  No interval change.  Needs continued semiannual follow-up.  Importance of blood pressure control was emphasized.  Hypertension-blood pressure mildly elevated with a systolic of 143 today.  Recommended that he monitor it at home.  Bicuspid aortic valve-no significant stenosis or insufficiency.  Monitored by Dr. Okey.  Coronary and thoracic aortic atherosclerosis-on rosuvastatin   Plan: Return in 6 months with CT angio of chest  Eugene JAYSON Millers, MD [facility] [phone]     "

## 2024-07-05 ENCOUNTER — Ambulatory Visit: Admitting: Psychology

## 2024-07-05 DIAGNOSIS — F339 Major depressive disorder, recurrent, unspecified: Secondary | ICD-10-CM | POA: Diagnosis not present

## 2024-07-05 DIAGNOSIS — Z634 Disappearance and death of family member: Secondary | ICD-10-CM | POA: Diagnosis not present

## 2024-07-05 DIAGNOSIS — F410 Panic disorder [episodic paroxysmal anxiety] without agoraphobia: Secondary | ICD-10-CM

## 2024-07-05 DIAGNOSIS — F331 Major depressive disorder, recurrent, moderate: Secondary | ICD-10-CM

## 2024-07-05 NOTE — Progress Notes (Signed)
 Merchandiser, Retail Health Counselor Initial Adult Exam  Name: Eugene Vernon Hogle Jr. Date: 07/05/2024 MRN: [id number] DOB: 1956-01-24 PCP: Wendolyn Jenkins Jansky, MD  Time spent: 60 minutes  Guardian/Payee:  N/A    Paperwork requested: No   Reason for Visit /Presenting Problem: Symptoms of Depression, Grief and Panic   Mental Status Exam: Appearance:   Casual     Behavior:  Appropriate  Motor:  Normal  Speech/Language:   Clear and Coherent  Affect:  Appropriate  Mood:  depressed  Thought process:  normal  Thought content:    WNL  Sensory/Perceptual disturbances:    WNL  Orientation:  oriented to person, place, and situation  Attention:  Good  Concentration:  Good  Memory:  WNL  Fund of knowledge:   Good  Insight:    Good  Judgment:   Good  Impulse Control:  Good    Reported Symptoms:  sadness, powerlessness, anxiety/panic  Risk Assessment: Danger to Self:  No Self-injurious Behavior: No Danger to Others: No Duty to Warn:no Physical Aggression / Violence:No  Access to Firearms a concern: No  Gang Involvement:No  Patient / guardian was educated about steps to take if suicide or homicide risk level increases between visits: n/a While future psychiatric events cannot be accurately predicted, the patient does not currently require acute inpatient psychiatric care and does not currently meet [place]  involuntary commitment criteria.  Substance Abuse  History: Current substance abuse: No     Past Psychiatric History:   Previous psychological history is significant for depression and panic Outpatient Providers:Lisa Polis History of Psych Hospitalization: No  Psychological Testing: N/A   Abuse History:  Victim of: No., N/A   Report needed: No. Victim of Neglect:No. Perpetrator of N/A  Witness / Exposure to Domestic Violence: No   Protective Services Involvement: No  Witness to [employer] Violence:  No   Family History:  Family History  Problem Relation Age of Onset   Hypertension Mother  Cancer Mother    Hearing loss Mother    Heart attack Father 89   Arthritis Father    Depression Father    Heart disease Father    Cancer Sister    Hearing loss Sister    Depression Paternal Uncle    Depression Paternal Uncle    Ovarian cancer Maternal Grandmother    Cancer Maternal Grandmother    Hearing loss Maternal Grandfather    Heart disease Maternal Grandfather    Hypertension Maternal Grandfather    Stroke Maternal Grandfather    Colon cancer Paternal Grandfather 64   Cancer Paternal Grandfather    Esophageal cancer Neg Hx    Rectal cancer Neg Hx    Stomach cancer Neg Hx     Living situation: the patient lives with their spouse  Sexual Orientation: Straight  Relationship Status: married  Name of spouse / other:unknown If a parent, number of children / ages:none  Support Systems: N/A  Surveyor, Quantity Stress:  No   Income/Employment/Disability: Social Herbalist: unknown  Educational History: Education: Risk Manager: unknown  Any cultural differences that may affect / interfere with treatment:  not applicable   Recreation/Hobbies: unknown  Stressors: Loss of mother    Strengths: Supportive Relationships and Able to Communicate Effectively  Barriers:  unknown   Legal History: Pending legal issue / charges: The patient has no significant  history of legal issues. History of legal issue / charges: N/A  Medical History/Surgical History: reviewed Past Medical History:  Diagnosis Date   Allergy    childhood - penicillin, 2014 - latex   Anxiety    Aortic aneurysm    Arthritis 2014   right shoulder   CLL (chronic lymphocytic leukemia) (HCC)    Coronary artery calcification seen on CT scan    Depressed    Enlarged prostate    Hx of adenomatous polyp of colon 02/2018   Dr. Milon   Hyperlipidemia    Hypertension    Sleep apnea 2022   Have stopped using CPAP    Past Surgical History:  Procedure Laterality Date   CORONARY PRESSURE/FFR STUDY N/A 04/28/2022   Procedure: INTRAVASCULAR PRESSURE WIRE/FFR STUDY;  Surgeon: Anner Alm ORN, MD;  Location: [facility] INVASIVE [facility];  Service: Cardiovascular;  Laterality: N/A;   LEFT HEART CATH AND CORONARY ANGIOGRAPHY N/A 04/28/2022   Procedure: LEFT HEART CATH AND CORONARY ANGIOGRAPHY;  Surgeon: Anner Alm ORN, MD;  Location: [facility] INVASIVE CV LAB;  Service: Cardiovascular;  Laterality: N/A;   TONSILLECTOMY  1961   TRANSURETHRAL RESECTION OF PROSTATE N/A 03/19/2023   Procedure: TRANSURETHRAL RESECTION OF THE PROSTATE (TURP);  Surgeon: Alvaro Ricardo KATHEE Mickey., MD;  Location: [facility];  Service: Urology;  Laterality: N/A;    Medications: Current Outpatient Medications  Medication Sig Dispense Refill   amLODipine  (NORVASC ) 5 MG tablet Take 1 tablet (5 mg total) by mouth daily. 90 tablet 3   aspirin  EC 81 MG tablet Take 1 tablet (81 mg total) by mouth daily. Swallow whole. 90 tablet 3   B COMPLEX VITAMINS PO Take 1 capsule by mouth daily. 100 mg daily     Cholecalciferol (VITAMIN D3) 50 MCG (2000 UT) CAPS Take 1 capsule by mouth daily.     ezetimibe  (ZETIA ) 10 MG tablet Take 1 tablet (10 mg total) by mouth daily. 90 tablet 2   fluvoxaMINE  (LUVOX ) 100 MG tablet Take 200 mg by mouth at bedtime.     lamoTRIgine  (LAMICTAL ) 200 MG tablet Take  200 mg by mouth at bedtime.     multivitamin  (ONE-A-DAY MEN'S) TABS tablet Take 1 tablet by mouth at bedtime.     naltrexone  (DEPADE) 50 MG tablet Take 1 tablet (50 mg total) by mouth daily. 30 tablet 0   omeprazole  (PRILOSEC) 20 MG capsule Take 1 capsule (20 mg total) by mouth daily as needed. 30 capsule 5   PREVIDENT 5000 BOOSTER PLUS 1.1 % PSTE Place 1 Application onto teeth daily.     rosuvastatin  (CRESTOR ) 20 MG tablet Take 1 tablet (20 mg total) by mouth daily. 90 tablet 2   traZODone  (DESYREL ) 50 MG tablet Take 0.5-1 tablets (25-50 mg total) by mouth at bedtime as needed for sleep. 30 tablet 5   WELLBUTRIN XL 150 MG 24 hr tablet Take 150 mg by mouth daily.     No current facility-administered medications for this visit.    Allergies  Allergen Reactions   Latex Rash   Levofloxacin Other (See Comments)    Avoid fluoroquinolone antibiotics due to thoracic aortic aneurysm   Penicillins Other (See Comments)    Childhood  Other Reaction(s): Unknown   Quinolones Other (See Comments)    Patient with ATAA (ascending thoracic aortic aneurysm)    Diagnoses:  Major Depression, Panic, Grief  Plan of Care: Outpatient Psychotherapy and medication management  Initial Note: He states he has taken psychotropic medication for years and sees Olam Holly at [facility]. States [place] and feels he has suffered depression most of his life. He only took vacation once a year and would get really depressed when it was over. First significant depression was at 33 when he had a break-up. Had more minor depressive episodes earlier in life. Change has always been difficult for him. Also has had severe panic attacks. First one he remembers was first day of his fifth year of college. He was distressed that this was the end of his college career. He recalls having a panic when on business trip to [place] in a [employer]. He had to come home. 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 He was  offered early retirement and had a panic when he realized that he just quit a job of 30 years and his life was changing. When he has panic, he doesn't have a fear of death, rather it is a fear of something he can't control. His anxiety was less prevalent in his life than the depression.  He says my major problem in life is depression and panic. His mother passed and he is struggling with her loss. She died 2 weeks ago at 62 and was a vibrant person for 92 years. In 1 year, she had rapid dementia. He says, she was his best friend. He has a small farm down the road from where his mother lived. He says I don't handle death well. His reason to seek therapy was before mother was deteriorating. He had tried therapy in the past and it was not helpful. Also, he has had medical challenges himself, but says it does not cause him distress. He has been on a number of different anti-depressants over the years.  He contacted me for his depression, ut says the grief is now most prevalent.  He has 1 sister in [place]. She is 3 years older and they have a good relationship, but she has kids and grandchildren and they keep her busy. He married at 73 and  wife was 34. His wife will occasionally tell him she blames him for not having kids. He told her when they married that he was too old to have kids. He says it was his inability to handle change. This is his only marriage for both he and his wife. Wife is from [place]  and has 3 sisters and one brother. None of her family in this area. He left [employer] at 52 (too early). 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. Wife retired in 2013.  Will complete intake at next visit. Patient states he wants to continue treatment and prefers face to face when possible.   Goals/Tx. Plan: Patient states that he is seeking symptom relief from life long depressive episodes. First, however, he needs support and strategies to manage grief over the recent (2 weeks) loss of  his mother. Will engage in grief counseling to reduce current despair. In addition, treatment will address his anxiety that contributes to episodic panic attacks. This will involve relaxation techniques and other behavioral strategies  to facilitate greater control over his anxiety. Patient agrees to plan with a goal date of 12-26.   Patient was seen in provider's office. Session note: Patient states that he and wife went to [place] over Christmas, which he says was a great distraction. Says that he still grieves even though he is 68. He had a message, which was not pleasant. Stating that being depressed, being in a dark room with somber music wasn't very positive. He talked about difficulty using his C-pap. He is having many episodes that interfere with his sleep, but has not gotten used to the machine. 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 Will says that he went for an entire month without drinking, but that when in [place] he started back. He says that his goal for the New Year is to stop drinking.                           CONI ALM KERNS, PhD 10:40a-11:30p 50 minutes                                                     "

## 2024-07-24 ENCOUNTER — Ambulatory Visit: Admitting: Psychology

## 2024-07-24 ENCOUNTER — Other Ambulatory Visit: Payer: Self-pay | Admitting: Internal Medicine

## 2024-07-25 ENCOUNTER — Ambulatory Visit: Admitting: Psychology

## 2024-07-25 DIAGNOSIS — F331 Major depressive disorder, recurrent, moderate: Secondary | ICD-10-CM | POA: Diagnosis not present

## 2024-07-25 NOTE — Progress Notes (Signed)
 English As A Second Language Teacher Health Counselor Initial Adult Exam  Name: Eugene Vernon Siegert Jr. Date: 07/25/2024 MRN: [id number] DOB: September 08, 1955 PCP: Wendolyn Jenkins Jansky, MD  Time spent: 60 minutes  Guardian/Payee:  N/A    Paperwork requested: No   Reason for Visit /Presenting Problem: Symptoms of Depression, Grief and Panic   Mental Status Exam: Appearance:   Casual     Behavior:  Appropriate  Motor:  Normal  Speech/Language:   Clear and Coherent  Affect:  Appropriate  Mood:  depressed  Thought process:  normal  Thought content:    WNL  Sensory/Perceptual disturbances:    WNL  Orientation:  oriented to person, place, and situation  Attention:  Good  Concentration:  Good  Memory:  WNL  Fund of knowledge:   Good  Insight:    Good  Judgment:   Good  Impulse Control:  Good    Reported Symptoms:  sadness, powerlessness, anxiety/panic  Risk Assessment: Danger to Self:  No Self-injurious Behavior: No Danger to Others: No Duty to Warn:no Physical Aggression / Violence:No  Access to Firearms a concern: No  Gang Involvement:No  Patient / guardian was educated about steps to take if suicide or homicide risk level increases between visits: n/a While future psychiatric events cannot be accurately predicted, the patient does not currently require acute inpatient psychiatric care and does not currently meet [place]  involuntary commitment  criteria.  Substance Abuse History: Current substance abuse: No     Past Psychiatric History:   Previous psychological history is significant for depression and panic Outpatient Providers:Lisa Polis History of Psych Hospitalization: No  Psychological Testing: N/A   Abuse History:  Victim of: No., N/A   Report needed: No. Victim of Neglect:No. Perpetrator of N/A  Witness / Exposure to Domestic Violence: No   Protective Services Involvement: No  Witness to [employer] Violence:  No   Family  History:  Family History  Problem Relation Age of Onset   Hypertension Mother    Cancer Mother    Hearing loss Mother    Heart attack Father 43   Arthritis Father    Depression Father    Heart disease Father    Cancer Sister    Hearing loss Sister    Depression Paternal Uncle    Depression Paternal Uncle    Ovarian cancer Maternal Grandmother    Cancer Maternal Grandmother    Hearing loss Maternal Grandfather    Heart disease Maternal Grandfather    Hypertension Maternal Grandfather    Stroke Maternal Grandfather    Colon cancer Paternal Grandfather 50   Cancer Paternal Grandfather    Esophageal cancer Neg Hx    Rectal cancer Neg Hx    Stomach cancer Neg Hx     Living situation: the patient lives with their spouse  Sexual Orientation: Straight  Relationship Status: married  Name of spouse / other:unknown If a parent, number of children / ages:none  Support Systems: N/A  Surveyor, Quantity Stress:  No   Income/Employment/Disability: Social Herbalist: unknown  Educational History: Education: Risk Manager: unknown  Any cultural differences that may affect / interfere with treatment:  not applicable   Recreation/Hobbies: unknown  Stressors: Loss of mother    Strengths: Supportive Relationships and Able to Communicate Effectively  Barriers:  unknown   Legal History: Pending legal issue / charges: The  patient has no significant history of legal issues. History of legal issue / charges: N/A  Medical History/Surgical History: reviewed Past Medical History:  Diagnosis Date   Allergy    childhood - penicillin, 2014 - latex   Anxiety    Aortic aneurysm    Arthritis 2014   right shoulder   CLL (chronic lymphocytic leukemia) (HCC)    Coronary artery calcification seen on CT scan    Depressed    Enlarged prostate    Hx of adenomatous polyp of colon 02/2018   Dr. Milon   Hyperlipidemia    Hypertension    Sleep apnea 2022   Have stopped using CPAP    Past Surgical History:  Procedure Laterality Date   CORONARY PRESSURE/FFR STUDY N/A 04/28/2022   Procedure: INTRAVASCULAR PRESSURE WIRE/FFR STUDY;  Surgeon: Anner Alm ORN, MD;  Location: [facility] INVASIVE CV LAB;  Service: Cardiovascular;  Laterality: N/A;   LEFT HEART CATH AND CORONARY ANGIOGRAPHY N/A 04/28/2022   Procedure: LEFT HEART CATH AND CORONARY ANGIOGRAPHY;  Surgeon: Anner Alm ORN, MD;  Location: [facility] INVASIVE CV LAB;  Service: Cardiovascular;  Laterality: N/A;   TONSILLECTOMY  1961   TRANSURETHRAL RESECTION OF PROSTATE N/A 03/19/2023   Procedure: TRANSURETHRAL RESECTION OF THE PROSTATE (TURP);  Surgeon: Alvaro Ricardo KATHEE Mickey., MD;  Location: [facility];  Service: Urology;  Laterality: N/A;    Medications: Current Outpatient Medications  Medication Sig Dispense Refill   amLODipine  (NORVASC ) 5 MG tablet Take 1 tablet (5 mg total) by mouth daily. 90 tablet 0   aspirin  EC 81 MG tablet Take 1 tablet (81 mg total) by mouth daily. Swallow whole. 90 tablet 3   B COMPLEX VITAMINS PO Take 1 capsule by mouth daily. 100 mg daily     Cholecalciferol (VITAMIN D3) 50 MCG (2000 UT) CAPS Take 1 capsule by mouth daily.     ezetimibe  (ZETIA ) 10 MG tablet Take 1 tablet (10 mg total) by mouth daily. 90 tablet 2   fluvoxaMINE  (LUVOX ) 100 MG tablet  Take 200 mg by mouth at bedtime.     lamoTRIgine  (LAMICTAL ) 200 MG tablet Take 200 mg by mouth at  bedtime.     multivitamin (ONE-A-DAY MEN'S) TABS tablet Take 1 tablet by mouth at bedtime.     naltrexone  (DEPADE) 50 MG tablet Take 1 tablet (50 mg total) by mouth daily. 30 tablet 0   omeprazole  (PRILOSEC) 20 MG capsule Take 1 capsule (20 mg total) by mouth daily as needed. 30 capsule 5   PREVIDENT 5000 BOOSTER PLUS 1.1 % PSTE Place 1 Application onto teeth daily.     rosuvastatin  (CRESTOR ) 20 MG tablet Take 1 tablet (20 mg total) by mouth daily. 90 tablet 2   traZODone  (DESYREL ) 50 MG tablet Take 0.5-1 tablets (25-50 mg total) by mouth at bedtime as needed for sleep. 30 tablet 5   WELLBUTRIN XL 150 MG 24 hr tablet Take 150 mg by mouth daily.     No current facility-administered medications for this visit.    Allergies  Allergen Reactions   Latex Rash   Levofloxacin Other (See Comments)    Avoid fluoroquinolone antibiotics due to thoracic aortic aneurysm   Penicillins Other (See Comments)    Childhood  Other Reaction(s): Unknown   Quinolones Other (See Comments)    Patient with ATAA (ascending thoracic aortic aneurysm)    Diagnoses:  Major Depression, Panic, Grief  Plan of Care: Outpatient Psychotherapy and medication management  Initial Note: He states he has taken psychotropic medication for years and sees Olam Holly at [facility]. States he [place] and feels he has suffered depression most of his life. He only took vacation once a year and would get really depressed when it was over. First significant depression was at 33 when he had a break-up. Had more minor depressive episodes earlier in life. Change has always been difficult for him. Also has had severe panic attacks. First one he remembers was first day of his fifth year of college. He was distressed that this was the end of his college career. He recalls having a panic when on business trip to [place] in a [employer]. He had to come home. 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 He was offered early retirement and had a panic when he realized that he just quit a job of 30 years and his life was changing. When he has panic, he doesn't have a fear of death, rather it is a fear of something he can't control. His anxiety was less prevalent in his life than the depression.  He says my major problem in life is depression and panic. His mother passed and he is struggling with her loss. She died 2 weeks ago at 22 and was a vibrant person for 92 years. In 1 year, she had rapid dementia. He says, she was his best friend. He has a small farm down the road from where his mother lived. He says I don't handle death well. His reason to seek therapy was before mother was deteriorating. He had tried therapy in the past and it was not helpful. Also, he has had medical challenges himself, but says it does not cause him distress. He has been on a number of different anti-depressants over the years.  He contacted me for his depression, ut says the grief is now most prevalent.  He has 1 sister in [place]. She is 3 years older and they have a good  relationship, but she has kids and grandchildren and they keep her busy. He married at 17 and wife was 66. His wife will occasionally tell him she blames him for not having kids. He told her when they married that he was too old to have kids. He says it was his inability to handle change. This is his only marriage for both he and his wife. Wife is from [place]  and has 3 sisters and one brother. None of her family in this area. He left [employer] at 52 (too early). 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. Wife retired in 2013.  Will complete intake at next visit. Patient states he wants to continue treatment and prefers face to face when possible.   Goals/Tx. Plan: Patient states that he is seeking symptom relief from life long depressive episodes. First, however, he needs support and strategies to manage grief over the  recent (2 weeks) loss of his mother. Will engage in grief counseling to reduce current despair. In addition, treatment will address his anxiety that contributes to episodic panic attacks. This will involve relaxation techniques and other behavioral strategies  to facilitate greater control over his anxiety. Patient agrees to plan with a goal date of 12-26.   Patient was seen in provider's office. Session note: Patient states that he is making progress with using the Cpap machine He is have about 9 events/hour. He will consider increasing pressure if it does not improve. He is having increased night sweats, which is a symptom of CLL. He is afraid of this symptom and what it might mean. Has read that this symptom might indicate further treatment. Will see the oncology practice in February. He is nervous about his own mortality because his ancestors died young. With regard to drinking, he says that he is not quite on the wagon at this time. He says alcohol doesn't do anything to him, so he cannot understand why he still drinks. We talked about plan to reduce drinking and not subjecting himself to vulnerable situations.                               CONI ALM KERNS, PhD 12:30p-1:15p 50 minutes                                                     "

## 2024-08-07 ENCOUNTER — Ambulatory Visit: Admitting: Psychology

## 2024-08-07 DIAGNOSIS — F331 Major depressive disorder, recurrent, moderate: Secondary | ICD-10-CM

## 2024-08-21 ENCOUNTER — Ambulatory Visit: Admitting: Psychology

## 2024-09-04 ENCOUNTER — Ambulatory Visit: Admitting: Psychology

## 2024-09-11 ENCOUNTER — Ambulatory Visit: Admitting: Psychology

## 2024-10-04 ENCOUNTER — Ambulatory Visit: Admitting: Psychology

## 2024-10-05 ENCOUNTER — Ambulatory Visit

## 2024-10-19 ENCOUNTER — Ambulatory Visit: Admitting: Psychology

## 2024-11-01 ENCOUNTER — Ambulatory Visit: Admitting: Psychology

## 2025-05-23 ENCOUNTER — Ambulatory Visit: Admitting: Neurology
